# Patient Record
Sex: Female | Born: 1945 | Race: White | Hispanic: No | Smoking: Never smoker
Health system: Southern US, Community
[De-identification: ages and names within clinical notes are randomized; demographics above are authoritative.]

## PROBLEM LIST (undated history)

## (undated) DIAGNOSIS — N183 Chronic kidney disease, stage 3 (moderate): Secondary | ICD-10-CM

## (undated) DIAGNOSIS — Z8619 Personal history of other infectious and parasitic diseases: Secondary | ICD-10-CM

## (undated) DIAGNOSIS — G43109 Migraine with aura, not intractable, without status migrainosus: Secondary | ICD-10-CM

## (undated) DIAGNOSIS — F329 Major depressive disorder, single episode, unspecified: Secondary | ICD-10-CM

## (undated) DIAGNOSIS — M8580 Other specified disorders of bone density and structure, unspecified site: Secondary | ICD-10-CM

## (undated) DIAGNOSIS — F419 Anxiety disorder, unspecified: Secondary | ICD-10-CM

## (undated) DIAGNOSIS — E039 Hypothyroidism, unspecified: Secondary | ICD-10-CM

## (undated) DIAGNOSIS — F32A Depression, unspecified: Secondary | ICD-10-CM

## (undated) DIAGNOSIS — T1490XA Injury, unspecified, initial encounter: Secondary | ICD-10-CM

## (undated) DIAGNOSIS — Z8669 Personal history of other diseases of the nervous system and sense organs: Secondary | ICD-10-CM

## (undated) DIAGNOSIS — R600 Localized edema: Secondary | ICD-10-CM

## (undated) DIAGNOSIS — C4490 Unspecified malignant neoplasm of skin, unspecified: Secondary | ICD-10-CM

## (undated) HISTORY — DX: Unspecified malignant neoplasm of skin, unspecified: C44.90

## (undated) HISTORY — PX: ABDOMINAL HYSTERECTOMY: SHX81

## (undated) HISTORY — DX: Localized edema: R60.0

## (undated) HISTORY — DX: Personal history of other infectious and parasitic diseases: Z86.19

## (undated) HISTORY — DX: Migraine with aura, not intractable, without status migrainosus: G43.109

## (undated) HISTORY — PX: TONSILLECTOMY: SUR1361

## (undated) HISTORY — DX: Depression, unspecified: F32.A

## (undated) HISTORY — PX: CATARACT EXTRACTION: SUR2

## (undated) HISTORY — DX: Injury, unspecified, initial encounter: T14.90XA

## (undated) HISTORY — DX: Chronic kidney disease, stage 3 (moderate): N18.3

## (undated) HISTORY — DX: Personal history of other diseases of the nervous system and sense organs: Z86.69

## (undated) HISTORY — DX: Major depressive disorder, single episode, unspecified: F32.9

## (undated) HISTORY — DX: Other specified disorders of bone density and structure, unspecified site: M85.80

---

## 1968-03-31 HISTORY — PX: BREAST EXCISIONAL BIOPSY: SUR124

## 1998-12-28 ENCOUNTER — Other Ambulatory Visit: Admission: RE | Admit: 1998-12-28 | Discharge: 1998-12-28 | Payer: Self-pay | Admitting: Family Medicine

## 1999-08-05 ENCOUNTER — Observation Stay (HOSPITAL_COMMUNITY): Admission: RE | Admit: 1999-08-05 | Discharge: 1999-08-06 | Payer: Self-pay | Admitting: Urology

## 1999-08-05 ENCOUNTER — Encounter: Payer: Self-pay | Admitting: Urology

## 1999-09-25 ENCOUNTER — Encounter: Admission: RE | Admit: 1999-09-25 | Discharge: 1999-09-25 | Payer: Self-pay | Admitting: Family Medicine

## 1999-09-25 ENCOUNTER — Encounter: Payer: Self-pay | Admitting: Family Medicine

## 2000-01-20 ENCOUNTER — Other Ambulatory Visit: Admission: RE | Admit: 2000-01-20 | Discharge: 2000-01-20 | Payer: Self-pay | Admitting: Family Medicine

## 2000-03-31 HISTORY — PX: TOTAL VAGINAL HYSTERECTOMY: SHX2548

## 2000-09-25 ENCOUNTER — Encounter: Payer: Self-pay | Admitting: Family Medicine

## 2000-09-25 ENCOUNTER — Encounter: Admission: RE | Admit: 2000-09-25 | Discharge: 2000-09-25 | Payer: Self-pay | Admitting: Family Medicine

## 2001-03-02 ENCOUNTER — Other Ambulatory Visit: Admission: RE | Admit: 2001-03-02 | Discharge: 2001-03-02 | Payer: Self-pay | Admitting: Family Medicine

## 2001-12-23 ENCOUNTER — Encounter: Admission: RE | Admit: 2001-12-23 | Discharge: 2001-12-23 | Payer: Self-pay | Admitting: Family Medicine

## 2001-12-23 ENCOUNTER — Encounter: Payer: Self-pay | Admitting: Family Medicine

## 2002-07-13 ENCOUNTER — Other Ambulatory Visit: Admission: RE | Admit: 2002-07-13 | Discharge: 2002-07-13 | Payer: Self-pay | Admitting: Family Medicine

## 2003-01-19 ENCOUNTER — Encounter: Payer: Self-pay | Admitting: Family Medicine

## 2003-01-19 ENCOUNTER — Encounter: Admission: RE | Admit: 2003-01-19 | Discharge: 2003-01-19 | Payer: Self-pay | Admitting: Family Medicine

## 2003-10-04 ENCOUNTER — Other Ambulatory Visit: Admission: RE | Admit: 2003-10-04 | Discharge: 2003-10-04 | Payer: Self-pay | Admitting: Obstetrics and Gynecology

## 2003-10-19 ENCOUNTER — Encounter (INDEPENDENT_AMBULATORY_CARE_PROVIDER_SITE_OTHER): Payer: Self-pay | Admitting: *Deleted

## 2003-10-20 ENCOUNTER — Inpatient Hospital Stay (HOSPITAL_COMMUNITY): Admission: RE | Admit: 2003-10-20 | Discharge: 2003-10-21 | Payer: Self-pay | Admitting: Obstetrics and Gynecology

## 2004-04-25 ENCOUNTER — Encounter: Admission: RE | Admit: 2004-04-25 | Discharge: 2004-04-25 | Payer: Self-pay | Admitting: Obstetrics and Gynecology

## 2005-01-01 ENCOUNTER — Ambulatory Visit (HOSPITAL_COMMUNITY): Admission: RE | Admit: 2005-01-01 | Discharge: 2005-01-01 | Payer: Self-pay | Admitting: Gastroenterology

## 2005-01-01 HISTORY — PX: COLONOSCOPY: SHX174

## 2005-01-01 LAB — HM COLONOSCOPY

## 2005-02-11 ENCOUNTER — Other Ambulatory Visit: Admission: RE | Admit: 2005-02-11 | Discharge: 2005-02-11 | Payer: Self-pay | Admitting: Obstetrics and Gynecology

## 2005-08-05 ENCOUNTER — Encounter: Admission: RE | Admit: 2005-08-05 | Discharge: 2005-08-05 | Payer: Self-pay | Admitting: Obstetrics and Gynecology

## 2006-09-28 ENCOUNTER — Encounter: Admission: RE | Admit: 2006-09-28 | Discharge: 2006-09-28 | Payer: Self-pay | Admitting: Family Medicine

## 2007-10-11 ENCOUNTER — Encounter: Admission: RE | Admit: 2007-10-11 | Discharge: 2007-10-11 | Payer: Self-pay | Admitting: Family Medicine

## 2007-10-20 ENCOUNTER — Encounter: Admission: RE | Admit: 2007-10-20 | Discharge: 2007-10-20 | Payer: Self-pay | Admitting: Family Medicine

## 2008-11-03 ENCOUNTER — Encounter: Admission: RE | Admit: 2008-11-03 | Discharge: 2008-11-03 | Payer: Self-pay | Admitting: Family Medicine

## 2009-10-08 LAB — LIPID PANEL
Cholesterol, Total: 207
Direct LDL: 123
HDL: 62 mg/dL (ref 35–70)
Triglycerides: 111

## 2009-11-14 ENCOUNTER — Encounter: Admission: RE | Admit: 2009-11-14 | Discharge: 2009-11-14 | Payer: Self-pay | Admitting: Family Medicine

## 2010-08-16 NOTE — Op Note (Signed)
NAME:  Karen [name], Karen [name]                          ACCOUNT NO.:  [id number]   MEDICAL RECORD NO.:  [id number]                   PATIENT TYPE:  INP   LOCATION:  9306                                 FACILITY:  [facility]   PHYSICIAN:  Michelle L. Vincente Poli, M.D.            DATE OF BIRTH:  1945-07-06   DATE OF PROCEDURE:  10/19/2003  DATE OF DISCHARGE:  10/21/2003                                 OPERATIVE REPORT   PREOPERATIVE DIAGNOSIS:  Uterine prolapse.   POSTOPERATIVE DIAGNOSIS:  Uterine prolapse.   OPERATION PERFORMED:  Laparoscopic-assisted vaginal hysterectomy and  bilateral salpingo-oophorectomy.   SURGEON:  Michelle L. Vincente Poli, M.D.   ASSISTANT:  Raynald Kemp, M.D.   ANESTHESIA:  General.   ESTIMATED BLOOD LOSS:  Less than 100 mL.   DRAINS:  Foley catheter.   COMPLICATIONS:  None.   SPECIMENS:  Uterus, cervix, tubes and ovaries.   DESCRIPTION OF PROCEDURE:  The patient was taken to the operating room and  intubated without difficulty.  She was then prepped and draped in standard  fashion and [name] Foley catheter was inserted into the bladder.  Attention was  turned to the abdomen.  [name] small infraumbilical incision was made.  The  Veress needle was inserted without difficulty.  Pneumoperitoneum was  achieved.  The Veress needle was removed and [name] 10 to 11 mm trocar was  inserted without difficulty.  The patient was then placed in mild  Trendelenburg position and [name] small 5 mm incision was made suprapubically and  [name] 5 mm trocar was inserted under direct visualization.  We then inspected  the uterus and ovaries and all pelvic structures appeared to be within  normal limits.  There were no pelvic adhesions noted.  We turned our  attention to the right side of the pelvis initially by grasping the right  ovary with [name] grasper, identifying the IP ligament and the ureter which were  well below the IP ligament, placing the Gyrus instrument across the IP  ligament and burning and transecting it  with hemostasis.  We then followed  our way along the mesosalpinx in similar fashion using the Gyrus and  dividing it with hemostasis.  We then turned our attention to the left side  of the pelvis where in [name] similar fashion, the left ovary was grasped with [name]  grasper.  The IP ligament was identified and the ureter was found to be well  below our clamp and the Gyrus instrument was used to burn and to divide and  transect the IP ligament on the left.  We then followed the mesosalpinx  along and divided it using the Gyrus instrument with good hemostasis. When  hemostasis was noted, we then turned our attention vaginally.  The patient's  legs were placed in high lithotomy position for better visualization  vaginally.  [name] tenaculum was used to grasp the cervix which was at the level  of the  introitus and [name] circumferential incision was made around the cervix.  We then entered the posterior cul-de-sac sharply and then with careful  dissection entered the anterior cul-de-sac sharply as well.  We then used  curved Heaney clamps to work our way up along the uterus by clamping just  beside the cervix.  Each pedicle was cut and suture ligated using 0 Vicryl  suture.  Excellent hemostasis was noted.  Then we worked our way up the  broad ligament staying just beside the uterus and each clamp used [name] curved  Heaney clamp.  The pedicles were cut and suture ligated using 0 Vicryl  suture.  Once we reached the level of the triple pedicles, we retroflexed  the uterus and removed the uterus in its entirety along with the tubes and  the ovaries.  All pedicles were inspected.  Hemostasis was again noted  everywhere.  We then closed the posterior cuff with continuous running  locked sutures and 0 Vicryl suture and then closed the entire vaginal cuff  from anterior posterior in [name] continuous running locked stitch using 2-0  Vicryl suture.  At this point we then turned our attention back to the  abdomen where the  pneumoperitoneum was then performed again and irrigation  was performed of the pelvis using the Nezhat.  All pedicles were inspected  and hemostasis was noted.  The pneumoperitoneum was released and the 5 mm  trocar was removed without difficulty under direct visualization.  No  bleeding was noted from the port sites intra-abdominally.  We then removed  the 11 mm trocar and closed each incision site with interrupteds using 3-0  Vicryl suture and infiltrated local into each incision site.  All sponge,  lap and instrument counts were correct times two.  The patient tolerated the  procedure well, was extubated and went to recovery room in stable condition.                                               Michelle L. Vincente Poli, M.D.    Florestine Avers  D:  10/23/2003  T:  10/24/2003  Job:  [id number]

## 2010-08-16 NOTE — Op Note (Signed)
[facility]  Patient:    Karen [name], Karen [name]                       MRN: [id number] Proc. Date: 08/05/99 Adm. Date:  [id number] Attending:  Laqueta Jean CC:         [facility]             Elease Hashimoto A. Benedetto Goad, M.D. [facility]                           Operative Report  PREOPERATIVE DIAGNOSIS:  Urinary incontinence with cystourethrocele and rectocele.  POSTOPERATIVE DIAGNOSIS:  Urinary incontinence with cystourethrocele and rectocele.  PROCEDURE:  Anterior vaginal vault repair and posterior vaginal vault repair, and pubovaginal sling.  SURGEON:  Sigmund I. Patsi Sears, M.D.  ANESTHESIA:  General endotracheal anesthesia.  PREPARATION:  After appropriate preanesthesia, the patient was brought to the operating room and placed on the operating table in the dorsal supine position where general endotracheal anesthesia was introduced.  She was then replaced in the dorsal lithotomy position and placed in low Allen stirrups and the pubis was prepped with Betadine solution and draped in the usual fashion.  INDICATIONS:  This 65 year old female is para 2-2-0, with a large cystourethrocele, which she can palpate, which is noted to be grade III to IV on physical examination, with urinary incontinence and a large rectocele as well.  DESCRIPTION OF PROCEDURE:  20 cc of Marcaine 0.5% with epinephrine 1:200,000 was injected into the pubocervical fascia large tissue, and a semilunar incision was made from the 3 oclock to the 12 oclock to 9 oclock position.  Subcutaneous tissue was dissected with the electrosurgical unit and a tongue of vaginal epithelial tissue was dissected.  The dissection was carried into the retropubic space bilaterally.  Using the pelvic anchor system, two anchors with sutures were placed bilaterally into the posterior portion of the pubis.  A 6 x 8 cm portion of tutoplast was then outlined in a T-fashion so that the sling measured  8 cm by 3 cm, and the T portion measured 2 cm x 6 cm.  The #1 Prolene sutures were then placed in retrograde fashion into the T and the tissue was sutured to the pubic wall.  A 2-0 PDS suture was then placed through the cardinal ligaments bilaterally to afford mid vaginal vault repair, and the remaining portion of the T-portion of the sling was sutured to the lateral fascia, following anterior repair with horizontal mattress 3-0 Vicryl popoffs.  The devascularized tip of the vaginal tissue was excised and the vaginal epithelium was closed with running 3-0 Vicryl sutures.  Attention was then directed to the rectocele, which was noted to be quite large. Two T-clamps were then placed in the lateral portion of the rectal vault, and 10 cc of Marcaine 0.25% plain was injected into the perirectal fascial arch tissue. Dissection was accomplished.  Midline dissection was accomplished.  There was no enterocele noted.  A portion of the remaining fascia was then used to place across the rectum and sutured to the lateral vaginal fascia.  The posterior vaginal epithelial tissue was then excised, and the tissue was closed with running buried 3-0 Vicryl popoff suture.  Excellent rectocele repair was accomplished, and the  vagina was packed in the standard fashion.  Following this, cystoscopy revealed  previously given blue contrast from both ureters, which were both identified and photodocumented.  Foley catheter  was placed.  The patient was given IV Toradol, awakened, and taken to the recovery room in good condition. DD:  08/05/99 TD:  08/06/99 Job: [id number]

## 2010-08-16 NOTE — Op Note (Signed)
NAMESALEEMA, [name]                ACCOUNT NO.:  [id number]   MEDICAL RECORD NO.:  [id number]          PATIENT TYPE:  AMB   LOCATION:  ENDO                         FACILITY:  [facility]   PHYSICIAN:  Anselmo Rod, M.D.  DATE OF BIRTH:  09/22/1945   DATE OF PROCEDURE:  01/01/2005  DATE OF DISCHARGE:                                 OPERATIVE REPORT   PROCEDURE:  Screening colonoscopy.   ENDOSCOPIST:  Anselmo Rod, M.D.   INSTRUMENT USED:  Olympus video colonoscope.   INDICATIONS FOR PROCEDURE:  A 65 year old white female with a history of  chronic constipation and intermittent rectal bleeding undergoing a screening  colonoscopy to rule out colonic polyps, masses, etc.   PREPROCEDURE PREPARATION:  Informed consent was procured from the patient.  The patient was fasted for 8 hours prior to the procedure and prepped with a  bottle of magnesium citrate and a gallon of GoLYTELY the night prior to the  procedure.  Risks and benefits of the procedure including a 10% miss rate of  cancer and polyp was discussed with her as well.   PREPROCEDURE PHYSICAL EXAMINATION:  VITAL SIGNS:  Stable.  NECK:  Supple.  CHEST:  Clear to auscultation.  S1 and S2 regular.  ABDOMEN:  Soft with normal bowel sounds.   DESCRIPTION OF PROCEDURE:  The patient was placed in the left lateral  decubitus position and sedated with 70 mg of Demerol and 8 mg of Versed in  slow incremental doses.  Once the patient was adequately sedated and  maintained on low flow oxygen and continuous cardiac monitoring, the Olympus  video colonoscope was advanced from the rectum to the cecum.  The  appendiceal orifice and ileocecal valve were visualized and photographed.  The patient had severe melanosis coli especially on the right side of the  colon including the transverse colon.  The changes were less prominent in  the rectum and sigmoid colon.  No masses or polyps were seen.  There was no  evidence of diverticulosis and large  internal hemorrhoid was appreciated on  retroflexion in the rectum.  The terminal ileum appeared normal and without  lesions.  The patient tolerated the procedure well without complications.  There was some residual stool in the colon.  Small lesions could have been  missed.   IMPRESSION:  1.  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  2.  Large internal hemorrhoid noted.  3.  Normal terminal ileum.  4.  Some residual stool in the colon.  Small lesions could be missed.   RECOMMENDATIONS:  1.Discontinue all laxatives including Senna and cascara  extracts.  1.  Repeat colonoscopy in the next 10 years unless the patient develops any      abnormal symptoms in the interim.  High fiber diet with liberal fluid      intake.  3.The patient will be referred for surgery for hemorrhoidal bleeding  persistence inspite of soft stool.4.Anusol HC  suppositoroes 25 gms one PR QHS has been advised and a prescription has  been  called into her pharmacy at [facility].  5.Outpatient follow-up as need arises in the future.      Anselmo Rod, M.D.  Electronically Signed     JNM/MEDQ  D:  01/01/2005  T:  01/01/2005  Job:  [id number]   cc:   Marcelino Duster L. Vincente Poli, M.D.  Fax: [phone]

## 2010-10-10 LAB — COMPREHENSIVE METABOLIC PANEL
ALT: 17 U/L (ref 7–35)
AST: 21 U/L
Alkaline Phosphatase: 94 U/L
Creat: 1.02
Total Bilirubin: 0.6 mg/dL

## 2010-10-10 LAB — CBC: Hemoglobin: 14.6 g/dL (ref 12.0–16.0)

## 2010-12-11 ENCOUNTER — Other Ambulatory Visit: Payer: Self-pay | Admitting: Family Medicine

## 2010-12-11 DIAGNOSIS — Z1231 Encounter for screening mammogram for malignant neoplasm of breast: Secondary | ICD-10-CM

## 2010-12-24 ENCOUNTER — Ambulatory Visit
Admission: RE | Admit: 2010-12-24 | Discharge: 2010-12-24 | Disposition: A | Discharge status: Home / Self Care | Payer: Medicare Other | Source: Ambulatory Visit | Attending: Family Medicine | Admitting: Family Medicine

## 2010-12-24 DIAGNOSIS — Z1231 Encounter for screening mammogram for malignant neoplasm of breast: Secondary | ICD-10-CM

## 2011-04-01 DIAGNOSIS — R600 Localized edema: Secondary | ICD-10-CM

## 2011-04-01 HISTORY — DX: Localized edema: R60.0

## 2011-08-19 ENCOUNTER — Other Ambulatory Visit: Payer: Self-pay | Admitting: Dermatology

## 2012-01-03 DIAGNOSIS — T1490XA Injury, unspecified, initial encounter: Secondary | ICD-10-CM

## 2012-01-03 HISTORY — PX: SKIN GRAFT: SHX250

## 2012-01-03 HISTORY — DX: Injury, unspecified, initial encounter: T14.90XA

## 2012-01-15 ENCOUNTER — Ambulatory Visit: Payer: Medicare Other | Admitting: Family Medicine

## 2012-01-16 ENCOUNTER — Encounter: Payer: Self-pay | Admitting: Family Medicine

## 2012-01-16 ENCOUNTER — Ambulatory Visit (INDEPENDENT_AMBULATORY_CARE_PROVIDER_SITE_OTHER): Payer: Medicare Other | Admitting: Family Medicine

## 2012-01-16 ENCOUNTER — Other Ambulatory Visit: Payer: Self-pay | Admitting: Cardiology

## 2012-01-16 ENCOUNTER — Encounter (INDEPENDENT_AMBULATORY_CARE_PROVIDER_SITE_OTHER): Payer: Medicare Other

## 2012-01-16 VITALS — BP 144/84 | HR 88 | Temp 98.3°F | Ht 63.5 in | Wt 148.2 lb

## 2012-01-16 DIAGNOSIS — S81809A Unspecified open wound, unspecified lower leg, initial encounter: Secondary | ICD-10-CM

## 2012-01-16 DIAGNOSIS — M7989 Other specified soft tissue disorders: Secondary | ICD-10-CM

## 2012-01-16 DIAGNOSIS — Z9889 Other specified postprocedural states: Secondary | ICD-10-CM

## 2012-01-16 DIAGNOSIS — Z945 Skin transplant status: Secondary | ICD-10-CM

## 2012-01-16 MED ORDER — CEPHALEXIN 500 MG PO CAPS: 500.0000 mg | ORAL_CAPSULE | Freq: Three times a day (TID) | ORAL | Status: DC
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
## 2012-01-16 NOTE — Patient Instructions (Addendum)
Pass by Karen [name]'s office to schedule ultrasound for today and wound care consult next week. Start keflex 500mg  three times daily for 7 days for possible infection. Continue dressing changes as up to now while we get you in to see wound care doctors. Do dressing changes on feet daily. We will request records from [facility] and from prior PCP. Return in 1 month for follow up. Good to meet you today, call us with questions.
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
## 2012-01-19 ENCOUNTER — Telehealth: Payer: Self-pay

## 2012-01-19 NOTE — Telephone Encounter (Signed)
Will see tomorrow.  Doppler negative for DVT.

## 2012-01-19 NOTE — Telephone Encounter (Signed)
Pt lt leg,foot and ankle is more swollen than when seen 01/16/12. No pain that is not controlled by tylenol and benadryl. No chest pain or SOB. Pt could not get appt with wound center until 01/29/12.  Dr Reece Agar said come in for reck. Pt said 01/20/12 at 8:15 is OK; advised pt if condition changed or worsened to go to UC or ER tonight. Pt voiced understanding and will keep leg elevated this evening. Pt said her daughter stays with her at night.

## 2012-01-20 ENCOUNTER — Ambulatory Visit (INDEPENDENT_AMBULATORY_CARE_PROVIDER_SITE_OTHER): Payer: Medicare Other | Admitting: Family Medicine

## 2012-01-20 ENCOUNTER — Encounter: Payer: Self-pay | Admitting: Family Medicine

## 2012-01-20 VITALS — BP 130/80 | HR 104 | Temp 98.9°F | Wt 150.5 lb

## 2012-01-20 DIAGNOSIS — M7989 Other specified soft tissue disorders: Secondary | ICD-10-CM

## 2012-01-20 DIAGNOSIS — S81809A Unspecified open wound, unspecified lower leg, initial encounter: Secondary | ICD-10-CM

## 2012-01-20 MED ORDER — HYDROCHLOROTHIAZIDE 12.5 MG PO CAPS: 12.5000 mg | ORAL_CAPSULE | Freq: Every day | ORAL | Status: DC | PRN

## 2012-01-20 NOTE — Assessment & Plan Note (Signed)
Remaining.  Not much better but not significantly worse either. reasurred given normal dopplers.   Did have large fluid collection in left thigh.

## 2012-01-20 NOTE — Patient Instructions (Addendum)
Try melatonin at night to help sleeping, or benadryl. Ok to use tylenol as needed. May use hydrochlorothiazide for swelling as needed. Ensure getting plenty of water.  Avoid salt.  Elevate left leg.  All of these things will help with swelling. Wounds are actually looking better today.  If doing great, we may cancel wound clinic appointment.  Give wounds 1 more week. Come back to see me in 1 month.

## 2012-01-20 NOTE — Assessment & Plan Note (Signed)
Wounds looking better - if continues improving, recommend cancelling wound clinic eval. Black eschar now looking more like scabbing than prior concern for necrosis
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
## 2012-01-26 ENCOUNTER — Telehealth: Payer: Self-pay | Admitting: Family Medicine

## 2012-01-26 NOTE — Telephone Encounter (Signed)
Patient called to ask you to cancel the Wound Ctr appt that  Was made by Gavin Pound. She said the wounds were healing good and that she didn't need to go there. Apparently there was some confusion over getting the information about the appt. The patient could not tell me how she got the appt info whether it was over the phone or here in person. She was upset with the process and asked how you were supposed to know if she should go or not and I assumed that you told her to call here to let you know how she was doing. I called and cancelled the appt that was made for 01/29/12 at [facility] wound center. I am not sure how Gavin Pound gave her this info maybe she left it on VM? Again Ms Balik could not tell me how she was given the Wound Ctr Appt at all.

## 2012-01-26 NOTE — Telephone Encounter (Signed)
Yes I told her to call us if wounds doing well to cancel wound center appt.  It's in my last patient instructions. Selena Batten, can you call Ms Simington to see if she has any more questions?  Thanks.

## 2012-01-27 NOTE — Telephone Encounter (Signed)
Called pt at home and cell phone.  Left message on cell apologizing for miscommunication yesterday, and confirming that we have cancelled wound care clinic consult this week.  Asked her to call us with any questions.

## 2012-01-27 NOTE — Telephone Encounter (Signed)
Spoke with patient. She was upset because no one from here called her to check on her to see if she needed to keep the wound clinic appt. I advised that her instructions given to her at her last OV that I reviewed with her instructed her that it was up to her to determine if she felt the need to keep that appt or not. She said she still felt that it was our responsibility as a medical practice to show more compassion after all she has been through to call and check on to see if she still needed the appointment. She said she didn't even know where to call to cancel it herself. I asked her if she was given the appt information and she said she was, but she still didn't know if she should've called here or there to cancel and said if we had called her to check on her, then she could've told us she didn't need the appt. She said she was very upset with the lack of compassion for her circumstances here because after all she has been through, she can't keep up with everything. She said she probably wouldn't be back as a patient. I told her I was sorry that she was upset, but that it seemed to be a misunderstanding on everyone's part because she was expecting something from [place], that is not our typical practice. She said she understood misunderstandings and that Dr. Reece Agar was very nice to her, but she would probably still be finding somewhere else to go.

## 2012-02-09 ENCOUNTER — Encounter: Payer: Self-pay | Admitting: Family Medicine

## 2012-02-16 ENCOUNTER — Ambulatory Visit: Payer: Medicare Other | Admitting: Family Medicine

## 2012-02-27 ENCOUNTER — Telehealth: Payer: Self-pay | Admitting: *Deleted

## 2012-02-27 NOTE — Telephone Encounter (Signed)
Just FYI, pt calling complaing about a no-show charge she got for 01/30/2012 and per pt she cancelled thru the appt reminder that called her. Pt did leave message at the billing office but just wanted a note in her chart, because she will not pay that bill.

## 2012-02-28 NOTE — Telephone Encounter (Signed)
Noted.  Will route to Adrienne to ensure pt isn't charged as she states cancelled.

## 2012-03-11 ENCOUNTER — Encounter: Payer: Self-pay | Admitting: Family Medicine

## 2012-04-05 ENCOUNTER — Encounter: Payer: Self-pay | Admitting: Family Medicine

## 2012-04-05 ENCOUNTER — Ambulatory Visit (INDEPENDENT_AMBULATORY_CARE_PROVIDER_SITE_OTHER): Payer: Medicare Other | Admitting: Family Medicine

## 2012-04-05 VITALS — BP 132/88 | HR 88 | Temp 98.0°F | Wt 152.0 lb

## 2012-04-05 DIAGNOSIS — F4323 Adjustment disorder with mixed anxiety and depressed mood: Secondary | ICD-10-CM | POA: Insufficient documentation

## 2012-04-05 DIAGNOSIS — F329 Major depressive disorder, single episode, unspecified: Secondary | ICD-10-CM

## 2012-04-05 DIAGNOSIS — F3289 Other specified depressive episodes: Secondary | ICD-10-CM

## 2012-04-05 DIAGNOSIS — F32A Depression, unspecified: Secondary | ICD-10-CM

## 2012-04-05 MED ORDER — CITALOPRAM HYDROBROMIDE 10 MG PO TABS: 10.0000 mg | ORAL_TABLET | Freq: Every day | ORAL | Status: DC

## 2012-04-05 MED ORDER — ALPRAZOLAM 0.25 MG PO TABS: 0.2500 mg | ORAL_TABLET | Freq: Two times a day (BID) | ORAL | Status: DC | PRN
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
## 2012-04-05 NOTE — Patient Instructions (Addendum)
Start citalopram (celexa) 10mg  daily - depression and anxiety medication.  This will be long term. Start alprazolam (xanax) anxiety medication to take if needed - 1/2 to 1 pill up to two times a day.  This will be temporary medicine. Return to see me in 1 month.

## 2012-04-05 NOTE — Assessment & Plan Note (Signed)
Mood disorder anticipate both depression and anxiety components, along with mourning. Discussed natural grieving process. Pt requests medication to help cope with current process as well as underlying mood disorder. Discussed different meds: SSRI and benzos - including side effects.  Will start celexa at 10mg  daily and xanax temporarily for next 1-2 months - discussed tolerance/dependance side effect of long term benzo use. rtc 1 mo for f/u, sooner if needed. A total of 25 minutes were spent face-to-face with the patient during this encounter and over half of that time was spent on counseling and coordination of care

## 2012-05-06 ENCOUNTER — Ambulatory Visit (INDEPENDENT_AMBULATORY_CARE_PROVIDER_SITE_OTHER): Payer: Medicare PPO | Admitting: Family Medicine

## 2012-05-06 ENCOUNTER — Encounter: Payer: Self-pay | Admitting: Family Medicine

## 2012-05-06 VITALS — BP 126/76 | HR 80 | Temp 98.2°F | Wt 149.2 lb

## 2012-05-06 DIAGNOSIS — F329 Major depressive disorder, single episode, unspecified: Secondary | ICD-10-CM

## 2012-05-06 DIAGNOSIS — F3289 Other specified depressive episodes: Secondary | ICD-10-CM

## 2012-05-06 DIAGNOSIS — F32A Depression, unspecified: Secondary | ICD-10-CM

## 2012-05-06 MED ORDER — CITALOPRAM HYDROBROMIDE 20 MG PO TABS: 20.0000 mg | ORAL_TABLET | Freq: Every day | ORAL | Status: DC
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
## 2012-05-06 NOTE — Patient Instructions (Addendum)
Lets increase celexa (citalopram) to 20mg  daily.  I'm glad you're doing well on this medicine.   May continue xanax (alprazolam) 0.25mg  1/2 to 1 tablet twice daily but use only as needed. For sleep , may use xanax but our goal will be to come off this medicine.  May try benadryl instead. Give me a call with any questions or concerns on new dose. Return to see me in 1 month, sooner if needed.

## 2012-05-06 NOTE — Assessment & Plan Note (Addendum)
Longstanding mood disorder, even prior to husband's passing. Grieving contributing but anticipate underlying depression and possible ADHD. Tolerant and responsive to celexa so will increase to 20mg  daily. Continue xanax prn for now, discussed eventual wean. rtc 1 mo for f/u.  PHQ9 = 13/27 GAD7 = 7/21 MDQ = negative.  A total of 30 minutes were spent face-to-face with the patient during this encounter and over half of that time was spent on counseling and coordination of care

## 2012-05-07 ENCOUNTER — Telehealth: Payer: Self-pay

## 2012-05-07 NOTE — Telephone Encounter (Signed)
Spoke with pt and discussed concerns.  Ok to continue 20mg  dose.

## 2012-05-07 NOTE — Telephone Encounter (Signed)
Pt seen 05/06/12 and Citalopram increased to 20 mg. When pt picked up med precaution sheet advised to check with physician; pt has family history of double first cousin committed suicide, 2nd cousin committed suicide and pts father had cirrhosis of liver. Pt wants to know if still OK for pt to take Citalopram 20 mg or should pt cut in half and take 10 mg daily. Please advise. [facility]

## 2012-05-18 ENCOUNTER — Telehealth: Payer: Self-pay

## 2012-05-18 NOTE — Telephone Encounter (Signed)
Spoke with pt - recommended cut celexa in 1/2 - to total of 10mg  daily as she knows she did well on this dose. Has appt with me in 2 wks. Pt agrees with plan.

## 2012-05-18 NOTE — Addendum Note (Signed)
Addended by: Eustaquio Boyden on: 05/18/2012 01:48 PM   Modules accepted: Orders

## 2012-05-18 NOTE — Telephone Encounter (Signed)
Pt said does not feel good inside and feels jittery. Pt said she does not normally take medicine. Pt presently taking Citalopram 20 mg once daily and alprazolam 0.25 mg taking twice daily. Pt said can tell no difference in how she feels since Citalopram increased from 10 mg to 20 mg. Pt does not feel like she will harm herself or anyone else. Pt crying while on phone. Pt does not want an appt; she just wants Dr Timoteo Expose opinion if she should continue her med or change med.Please advise. [facility].

## 2012-06-01 ENCOUNTER — Telehealth: Payer: Self-pay | Admitting: Family Medicine

## 2012-06-01 ENCOUNTER — Ambulatory Visit: Payer: Medicare PPO | Admitting: Family Medicine

## 2012-06-01 NOTE — Telephone Encounter (Signed)
Noted. Thanks.

## 2012-06-01 NOTE — Telephone Encounter (Signed)
Karen [name] called to reschedule her appointment to 06/11/12, and just wanted you to know that she is sleeping everyday and doing very well.  She was talkative and excited about her upcoming family trip to [place] with all her children and grandchildren. Callback not needed, just an FYI.  Best number for Karen [name] is [phone] H                                            [phone] cell

## 2012-06-03 ENCOUNTER — Ambulatory Visit: Payer: Medicare PPO | Admitting: Family Medicine

## 2012-06-04 ENCOUNTER — Ambulatory Visit: Payer: Medicare PPO | Admitting: Family Medicine

## 2012-06-08 ENCOUNTER — Encounter: Payer: Self-pay | Admitting: Family Medicine

## 2012-06-08 ENCOUNTER — Ambulatory Visit (INDEPENDENT_AMBULATORY_CARE_PROVIDER_SITE_OTHER): Payer: Medicare PPO | Admitting: Family Medicine

## 2012-06-08 VITALS — BP 118/78 | HR 88 | Temp 98.7°F | Wt 148.8 lb

## 2012-06-08 DIAGNOSIS — F39 Unspecified mood [affective] disorder: Secondary | ICD-10-CM

## 2012-06-08 MED ORDER — ALPRAZOLAM 0.25 MG PO TABS: 0.2500 mg | ORAL_TABLET | Freq: Two times a day (BID) | ORAL | Status: DC | PRN

## 2012-06-08 NOTE — Patient Instructions (Addendum)
Look at sleep hygiene suggestions below - if not helping we may discuss other medicines to help staying asleep.   Insomnia Insomnia is frequent trouble falling and/or staying asleep. Insomnia can be a long term problem or a short term problem. Both are common. Insomnia can be a short term problem when the wakefulness is related to a certain stress or worry. Long term insomnia is often related to ongoing stress during waking hours and/or poor sleeping habits. Overtime, sleep deprivation itself can make the problem worse. Every little thing feels more severe because you are overtired and your ability to cope is decreased. CAUSES   Stress, anxiety, and depression.  Poor sleeping habits.  Distractions such as TV in the bedroom.  Naps close to bedtime.  Engaging in emotionally charged conversations before bed.  Technical reading before sleep.  Alcohol and other sedatives. They may make the problem worse. They can hurt normal sleep patterns and normal dream activity.  Stimulants such as caffeine for several hours prior to bedtime.  Pain syndromes and shortness of breath can cause insomnia.  Exercise late at night.  Changing time zones may cause sleeping problems (jet lag). It is sometimes helpful to have someone observe your sleeping patterns. They should look for periods of not breathing during the night (sleep apnea). They should also look to see how long those periods last. If you live alone or observers are uncertain, you can also be observed at a sleep clinic where your sleep patterns will be professionally monitored. Sleep apnea requires a checkup and treatment. Give your caregivers your medical history. Give your caregivers observations your family has made about your sleep.  SYMPTOMS   Not feeling rested in the morning.  Anxiety and restlessness at bedtime.  Difficulty falling and staying asleep. TREATMENT   Your caregiver may prescribe treatment for an underlying medical  disorders. Your caregiver can give advice or help if you are using alcohol or other drugs for self-medication. Treatment of underlying problems will usually eliminate insomnia problems.  Medications can be prescribed for short time use. They are generally not recommended for lengthy use.  Over-the-counter sleep medicines are not recommended for lengthy use. They can be habit forming.  You can promote easier sleeping by making lifestyle changes such as:  Using relaxation techniques that help with breathing and reduce muscle tension.  Exercising earlier in the day.  Changing your diet and the time of your last meal. No night time snacks.  Establish a regular time to go to bed.  Counseling can help with stressful problems and worry.  Soothing music and white noise may be helpful if there are background noises you cannot remove.  Stop tedious detailed work at least one hour before bedtime. HOME CARE INSTRUCTIONS   Keep a diary. Inform your caregiver about your progress. This includes any medication side effects. See your caregiver regularly. Take note of:  Times when you are asleep.  Times when you are awake during the night.  The quality of your sleep.  How you feel the next day. This information will help your caregiver care for you.  Get out of bed if you are still awake after 15 minutes. Read or do some quiet activity. Keep the lights down. Wait until you feel sleepy and go back to bed.  Keep regular sleeping and waking hours. Avoid naps.  Exercise regularly.  Avoid distractions at bedtime. Distractions include watching television or engaging in any intense or detailed activity like attempting to balance the household  checkbook.  Develop a bedtime ritual. Keep a familiar routine of bathing, brushing your teeth, climbing into bed at the same time each night, listening to soothing music. Routines increase the success of falling to sleep faster.  Use relaxation techniques.  This can be using breathing and muscle tension release routines. It can also include visualizing peaceful scenes. You can also help control troubling or intruding thoughts by keeping your mind occupied with boring or repetitive thoughts like the old concept of counting sheep. You can make it more creative like imagining planting one beautiful flower after another in your backyard garden.  During your day, work to eliminate stress. When this is not possible use some of the previous suggestions to help reduce the anxiety that accompanies stressful situations. MAKE SURE YOU:   Understand these instructions.  Will watch your condition.  Will get help right away if you are not doing well or get worse. Document Released: 03/14/2000 Document Revised: 06/09/2011 Document Reviewed: 04/14/2007 [facility] Patient Information 2013 [place], [place].

## 2012-06-08 NOTE — Assessment & Plan Note (Addendum)
Pt does feel overall improvement since starting celexa, but did not tolerate 20mg  dose. Has been using alprazolam intermittently - discussed use and recommended only use PRN. rtc 3 mo for f/u.  Some associated insomnia - sleep maintenance.  Discussed sleep hygiene measures and provided with handout.  If persistent, consider silenor.  Prior visit: PHQ9 = 13 -> 12 GAD7 = 7 -> 8
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
## 2012-06-09 ENCOUNTER — Other Ambulatory Visit: Payer: Self-pay | Admitting: Family Medicine

## 2012-06-11 ENCOUNTER — Ambulatory Visit: Payer: Medicare PPO | Admitting: Family Medicine

## 2012-06-15 ENCOUNTER — Other Ambulatory Visit: Payer: Self-pay

## 2012-06-15 DIAGNOSIS — Z1231 Encounter for screening mammogram for malignant neoplasm of breast: Secondary | ICD-10-CM

## 2012-06-21 ENCOUNTER — Ambulatory Visit: Payer: Medicare PPO | Admitting: Family Medicine

## 2012-06-21 ENCOUNTER — Encounter: Payer: Self-pay | Admitting: Family Medicine

## 2012-06-21 ENCOUNTER — Ambulatory Visit (INDEPENDENT_AMBULATORY_CARE_PROVIDER_SITE_OTHER): Payer: Medicare PPO | Admitting: Family Medicine

## 2012-06-21 VITALS — BP 142/78 | HR 84 | Temp 98.0°F | Wt 150.0 lb

## 2012-06-21 DIAGNOSIS — F39 Unspecified mood [affective] disorder: Secondary | ICD-10-CM

## 2012-06-21 MED ORDER — DOXEPIN HCL 3 MG PO TABS: 3.0000 mg | ORAL_TABLET | Freq: Every evening | ORAL | Status: DC | PRN

## 2012-06-21 MED ORDER — PAROXETINE HCL 20 MG PO TABS: 20.0000 mg | ORAL_TABLET | ORAL | Status: DC

## 2012-06-21 NOTE — Assessment & Plan Note (Signed)
Remains resistant to CBT. Prior endorsed celexa did significantly improve sxs, but currently no benefit noted. Will stop celexa, trial of paxil (given no appetite), start at 20mg  daily. rtc 1 mo, sooner if needed.  For insomnia - sleep maintenance - trial of silenor.  We have discussed sleep hygiene in past.  Benadryl no help.  Excited about upcoming trip with family to [place].

## 2012-06-21 NOTE — Patient Instructions (Signed)
Let's stop citalopram - may throw it away, and start paxil (paroxetine) 20mg  daily starting tomorrow. May continue alprazolam as needed for anxiety. Trial of silenor - this was sent in.  Coupon provided today. Return to see me in 1 month.
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
## 2012-07-05 ENCOUNTER — Other Ambulatory Visit: Payer: Self-pay | Admitting: Family Medicine

## 2012-07-05 MED ORDER — ALPRAZOLAM 0.25 MG PO TABS: ORAL_TABLET | ORAL | Status: DC

## 2012-07-05 NOTE — Telephone Encounter (Signed)
Ok to refill 

## 2012-07-05 NOTE — Telephone Encounter (Signed)
plz phone in. 

## 2012-07-06 NOTE — Telephone Encounter (Signed)
Pt called request status of refill; Medication phoned to [facility] as instructed.pt notified done.

## 2012-07-06 NOTE — Telephone Encounter (Signed)
Rx called as directed. 

## 2012-07-19 ENCOUNTER — Ambulatory Visit (INDEPENDENT_AMBULATORY_CARE_PROVIDER_SITE_OTHER): Payer: Medicare PPO | Admitting: Family Medicine

## 2012-07-19 ENCOUNTER — Encounter: Payer: Self-pay | Admitting: Family Medicine

## 2012-07-19 VITALS — BP 126/74 | HR 82 | Temp 98.0°F | Ht 63.5 in | Wt 153.0 lb

## 2012-07-19 DIAGNOSIS — F39 Unspecified mood [affective] disorder: Secondary | ICD-10-CM

## 2012-07-19 NOTE — Assessment & Plan Note (Signed)
Stable today. Improved on paxil. Continue alprazolam prn. Continue silenor for sleep at night.

## 2012-07-19 NOTE — Patient Instructions (Signed)
You are doing well. No changes today. Only use alprazolam as needed. Continue paxil 20mg  daily.
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
## 2012-08-04 ENCOUNTER — Ambulatory Visit: Payer: Medicare PPO

## 2012-08-10 ENCOUNTER — Other Ambulatory Visit: Payer: Self-pay | Admitting: Family Medicine

## 2012-08-10 NOTE — Telephone Encounter (Signed)
Medication phoned to pharmacy.  

## 2012-08-10 NOTE — Telephone Encounter (Signed)
OK to refill

## 2012-08-10 NOTE — Telephone Encounter (Signed)
plz phone in. 

## 2012-09-03 ENCOUNTER — Ambulatory Visit: Payer: Medicare PPO

## 2012-09-09 ENCOUNTER — Other Ambulatory Visit: Payer: Self-pay | Admitting: Family Medicine

## 2012-09-09 NOTE — Telephone Encounter (Signed)
[facility] 

## 2012-09-09 NOTE — Telephone Encounter (Signed)
plz phone in. 

## 2012-09-10 ENCOUNTER — Ambulatory Visit
Admission: RE | Admit: 2012-09-10 | Discharge: 2012-09-10 | Disposition: A | Discharge status: Home / Self Care | Payer: Medicare PPO | Source: Ambulatory Visit

## 2012-09-10 DIAGNOSIS — Z1231 Encounter for screening mammogram for malignant neoplasm of breast: Secondary | ICD-10-CM

## 2012-09-17 ENCOUNTER — Telehealth: Payer: Self-pay

## 2012-09-17 NOTE — Telephone Encounter (Signed)
Pt left note requesting increase alprazolam from 2 times a day to 3 times a day if needed.Please advise.

## 2012-09-19 NOTE — Telephone Encounter (Signed)
plz notify - ok to increase to TID prn - but I still want her limiting this med - and don't want her taking more than #60 per month

## 2012-09-20 NOTE — Telephone Encounter (Signed)
If needing 60+ alprazolam per month, I want her to come in to discuss. I don't recommend regular use of alprazolam daily - last time I saw her she was only taking one every few days.

## 2012-09-20 NOTE — Telephone Encounter (Signed)
Pt called back; pt already taking alprazolam twice daily and if cannot take more than # 60 / month that will not help pt to tell her OK to take tid prn.Please advise.

## 2012-09-20 NOTE — Telephone Encounter (Signed)
Patient notified as instructed by telephone. 

## 2012-09-21 NOTE — Telephone Encounter (Signed)
Spoke with patient yesterday and advised her of this. She verbalized understanding and said she didn't need 3 pills everyday only some days and was afraid she would run out of meds before 30 days. I advised if she found she was needing them more, then to call for an appt to discuss things. She understood and said she would only take 3 on days she needed 3 and not 3 every day.

## 2012-09-22 ENCOUNTER — Telehealth: Payer: Self-pay

## 2012-09-22 MED ORDER — PAROXETINE HCL 30 MG PO TABS: 30.0000 mg | ORAL_TABLET | ORAL | Status: DC

## 2012-09-22 NOTE — Telephone Encounter (Signed)
Pt said sometimes pt feels better and other times pt feels depressed with decreased appetite.Pt feels pretty good today.Pt said she does not feel like she would harm herself or anyone else.Pt wants to know if Dr Reece Agar thinks Paroxetine 20 mg needs to be increased. Pt does not want to schedule appt. Until ask about increasing paroxetine.[facility]. Pt request call back.

## 2012-09-22 NOTE — Telephone Encounter (Signed)
I agree.  Do recommend increase in paroxetine - but would suggest she increase to 1.5 tablets daily and see how she does on this.  I have sent in 30mg  tablets so she won't have to cut in half when she runs out of current 20mg  tablets. Call us with an update or come in for visit in ~3 weeks.

## 2012-09-22 NOTE — Telephone Encounter (Signed)
Instructions given to pt.  Verbalized understanding and is going to pick up medication today.

## 2012-10-07 ENCOUNTER — Other Ambulatory Visit: Payer: Self-pay | Admitting: Family Medicine

## 2012-10-07 NOTE — Telephone Encounter (Signed)
Ok to refill 

## 2012-10-08 ENCOUNTER — Other Ambulatory Visit: Payer: Self-pay | Admitting: Family Medicine

## 2012-10-08 NOTE — Telephone Encounter (Signed)
plz phone in. 

## 2012-10-08 NOTE — Telephone Encounter (Signed)
Medication phoned to pharmacy.  

## 2012-10-08 NOTE — Telephone Encounter (Signed)
plz phone in. If not already done 

## 2012-10-15 ENCOUNTER — Encounter: Payer: Self-pay | Admitting: *Deleted

## 2012-10-18 ENCOUNTER — Encounter: Payer: Self-pay | Admitting: Family Medicine

## 2012-10-18 ENCOUNTER — Ambulatory Visit (INDEPENDENT_AMBULATORY_CARE_PROVIDER_SITE_OTHER): Payer: Medicare PPO | Admitting: Family Medicine

## 2012-10-18 VITALS — BP 130/80 | HR 100 | Temp 98.2°F | Wt 151.5 lb

## 2012-10-18 DIAGNOSIS — F39 Unspecified mood [affective] disorder: Secondary | ICD-10-CM

## 2012-10-18 NOTE — Patient Instructions (Addendum)
Good to see you today, call us with questions. Check on tetanus shot during your hospitalization. Think about the pneumonia shot. Return in 4-6 months for follow up.

## 2012-10-18 NOTE — Assessment & Plan Note (Signed)
Anticipate depression with anxiety, as well as possible ADHD component. Stable on paxil 30mg  dose and xanax prn. Pt has been using benzo regularly bid, advised to try and back off regular use of this med. Pt agrees to trial of slow wean. RTC 4-6 mo for f/u Update controlled substance agreement today.
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
## 2012-10-28 ENCOUNTER — Encounter: Payer: Self-pay | Admitting: Family Medicine

## 2012-11-08 ENCOUNTER — Other Ambulatory Visit: Payer: Self-pay | Admitting: Family Medicine

## 2012-11-08 NOTE — Telephone Encounter (Signed)
plz phone in. 

## 2012-11-08 NOTE — Telephone Encounter (Signed)
Rx called in as directed.   

## 2012-11-08 NOTE — Telephone Encounter (Signed)
Ok to refill 

## 2012-11-18 ENCOUNTER — Telehealth: Payer: Self-pay | Admitting: Family Medicine

## 2012-11-18 NOTE — Telephone Encounter (Signed)
Great.  Thanks

## 2012-11-18 NOTE — Telephone Encounter (Signed)
Pt has an appmt 02/18/2013 at 4:15 for a follow up but she asked that I make you aware she is currently only needs to take the alprazolam twice a day, instead of three times a day.  She says she's only needed to take it three times a day once.  This is just an FYI that the patient asked me to pass on to you. Thank you.

## 2012-11-25 ENCOUNTER — Encounter: Payer: Self-pay | Admitting: Family Medicine

## 2012-11-25 ENCOUNTER — Ambulatory Visit (INDEPENDENT_AMBULATORY_CARE_PROVIDER_SITE_OTHER): Payer: Medicare PPO | Admitting: Family Medicine

## 2012-11-25 VITALS — BP 130/80 | HR 84 | Temp 99.1°F | Wt 152.0 lb

## 2012-11-25 DIAGNOSIS — F39 Unspecified mood [affective] disorder: Secondary | ICD-10-CM

## 2012-11-25 MED ORDER — LORAZEPAM 1 MG PO TABS: 0.5000 mg | ORAL_TABLET | Freq: Three times a day (TID) | ORAL | Status: DC | PRN
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
## 2012-11-25 NOTE — Patient Instructions (Addendum)
Schedule physical at your convenience, prior fasting for blood work. Continue paroxetine 30mg  daily. Stop alprazolam (xanax).  Start lorazepam 1/2 to 1 tablet up to three times daily as needed. Let's start walking again!

## 2012-11-25 NOTE — Assessment & Plan Note (Addendum)
Adjustment disorder along with anxiety/depression, ?ADHD component. Continue paxil 30mg  daily. Will change benzo to longer acting lorazepam - take 1/2 to 1 tablet of 1mg  twice to three times daily as needed for anxiety. RTC for wellness visit.

## 2012-11-26 ENCOUNTER — Telehealth: Payer: Self-pay | Admitting: Family Medicine

## 2012-11-26 ENCOUNTER — Telehealth: Payer: Self-pay

## 2012-11-26 NOTE — Telephone Encounter (Signed)
Pt brought pill bottles for silenor 3mg  #22 and alprazolam 0.25mg  #18 - discarded in sharps container.

## 2012-11-26 NOTE — Telephone Encounter (Signed)
Pt came in and wanted to review how to take Paxil; printed pt AVS from 11/25/12 and reviewed with pt and she stated she understood how to take Paxil.

## 2012-11-29 DIAGNOSIS — M810 Age-related osteoporosis without current pathological fracture: Secondary | ICD-10-CM | POA: Insufficient documentation

## 2012-11-29 DIAGNOSIS — M8580 Other specified disorders of bone density and structure, unspecified site: Secondary | ICD-10-CM

## 2012-11-29 HISTORY — DX: Other specified disorders of bone density and structure, unspecified site: M85.80

## 2012-12-10 ENCOUNTER — Telehealth: Payer: Self-pay | Admitting: Family Medicine

## 2012-12-10 ENCOUNTER — Encounter: Payer: Medicare PPO | Admitting: Family Medicine

## 2012-12-10 NOTE — Telephone Encounter (Signed)
Confidential Office Message [address] [place], [place] [address] p. [phone] f. [phone] To: [facility] (After Hours Triage) Fax: [phone] From: [facility] Date/ Time: 12/09/2012 9:02 PM Taken By: Mercer Pod, CSR Caller: Mitzi Davenport Facility: not collected Patient: Karen [name], Karen [name] DOB: 1946/03/22 Phone: [phone] Reason for Call: See info below Regarding Appointment: Yes Appt Date: 12/11/2010 Appt Time: 10:30:00 AM Provider: Eustaquio Boyden (Family Practice) Reason: Cancel Appointment Details: has to work needs to reschedule Outcome: Cancelled appointment in EPIC [facility]) Confidential

## 2012-12-16 ENCOUNTER — Encounter: Payer: Self-pay | Admitting: Family Medicine

## 2012-12-16 ENCOUNTER — Ambulatory Visit (INDEPENDENT_AMBULATORY_CARE_PROVIDER_SITE_OTHER): Payer: Medicare PPO | Admitting: Family Medicine

## 2012-12-16 VITALS — BP 126/82 | HR 80 | Temp 98.8°F | Ht 63.5 in | Wt 150.2 lb

## 2012-12-16 DIAGNOSIS — Z23 Encounter for immunization: Secondary | ICD-10-CM

## 2012-12-16 DIAGNOSIS — Z780 Asymptomatic menopausal state: Secondary | ICD-10-CM

## 2012-12-16 DIAGNOSIS — Z0000 Encounter for general adult medical examination without abnormal findings: Secondary | ICD-10-CM

## 2012-12-16 LAB — BASIC METABOLIC PANEL
BUN: 14 mg/dL (ref 6–23)
CO2: 29 mEq/L (ref 19–32)
Calcium: 9 mg/dL (ref 8.4–10.5)
Chloride: 105 mEq/L (ref 96–112)
Creatinine, Ser: 1.2 mg/dL (ref 0.4–1.2)
GFR: 47.06 mL/min — ABNORMAL LOW (ref >60.00)
Glucose, Bld: 85 mg/dL (ref 70–99)
Potassium: 3.7 mEq/L (ref 3.5–5.1)
Sodium: 139 mEq/L (ref 135–145)

## 2012-12-16 LAB — LIPID PANEL
Cholesterol: 203 mg/dL — ABNORMAL HIGH (ref 0–200)
HDL: 55.6 mg/dL (ref >39.00)
Total CHOL/HDL Ratio: 4
Triglycerides: 127 mg/dL (ref 0.0–149.0)
VLDL: 25.4 mg/dL (ref 0.0–40.0)

## 2012-12-16 LAB — LDL CHOLESTEROL, DIRECT: Direct LDL: 135.6 mg/dL

## 2012-12-16 NOTE — Patient Instructions (Addendum)
Flu and pneumonia shots today Bring me copy of living will to update your chart. We will schedule you for a bone density scan. Blood work today. Good to see you today, call us with questions. Sign release of info form for latest colonoscopy from Dr. Loreta Ave to see when next due.
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
## 2012-12-16 NOTE — Assessment & Plan Note (Addendum)
I have personally reviewed the Medicare Annual Wellness questionnaire and have noted 1. The patient's medical and social history 2. Their use of alcohol, tobacco or illicit drugs 3. Their current medications and supplements 4. The patient's functional ability including ADL's, fall risks, home safety risks and hearing or visual impairment. 5. Diet and physical activity 6. Evidence for depression or mood disorders The patients weight, height, BMI have been recorded in the chart.  Hearing and vision has been addressed. I have made referrals, counseling and provided education to the patient based review of the above and I have provided the pt with a written personalized care plan for preventive services. See scanned questionairre. Advanced directives discussed: packet of information provided  Reviewed preventative protocols and updated unless pt declined. Flu and pneumonia shots today. will request records of latest colonoscopy. Schedule DEXA today.

## 2012-12-16 NOTE — Addendum Note (Signed)
Addended by: Josph Macho A on: 12/16/2012 01:03 PM   Modules accepted: Orders

## 2012-12-17 ENCOUNTER — Encounter: Payer: Self-pay | Admitting: *Deleted

## 2012-12-21 ENCOUNTER — Other Ambulatory Visit: Payer: Self-pay | Admitting: Family Medicine

## 2012-12-22 ENCOUNTER — Other Ambulatory Visit: Payer: Self-pay | Admitting: Family Medicine

## 2012-12-22 NOTE — Telephone Encounter (Signed)
Rx called in as directed.   

## 2012-12-28 ENCOUNTER — Telehealth: Payer: Self-pay

## 2012-12-28 ENCOUNTER — Ambulatory Visit (INDEPENDENT_AMBULATORY_CARE_PROVIDER_SITE_OTHER)
Admission: RE | Admit: 2012-12-28 | Discharge: 2012-12-28 | Disposition: A | Discharge status: Home / Self Care | Payer: Medicare PPO | Source: Ambulatory Visit | Attending: Family Medicine | Admitting: Family Medicine

## 2012-12-28 DIAGNOSIS — Z780 Asymptomatic menopausal state: Secondary | ICD-10-CM

## 2012-12-28 NOTE — Telephone Encounter (Signed)
Pt request directions to office for bone density; advised [address]; across from [place] and when enter [place] building go to basement; pt voiced understanding.

## 2012-12-29 ENCOUNTER — Encounter: Payer: Self-pay | Admitting: Family Medicine

## 2013-01-14 ENCOUNTER — Other Ambulatory Visit: Payer: Self-pay | Admitting: Family Medicine

## 2013-01-14 NOTE — Telephone Encounter (Signed)
Pt left v/m requesting refill of lorazepam today due to pt will be out of med on 01/16/13.

## 2013-01-14 NOTE — Telephone Encounter (Signed)
Medication phoned to pharmacy.  

## 2013-01-14 NOTE — Telephone Encounter (Signed)
plz phone in lorazepam and notify patient.

## 2013-01-14 NOTE — Telephone Encounter (Signed)
Patient called for DEXA results. I don't see them in her chart. Have you received them yet?

## 2013-01-14 NOTE — Telephone Encounter (Signed)
Last office visit 12/16/2012.  Ok to refill? 

## 2013-01-14 NOTE — Telephone Encounter (Signed)
Ok to refill? She only has enough to get her through the weekend.

## 2013-01-15 ENCOUNTER — Telehealth: Payer: Self-pay | Admitting: Family Medicine

## 2013-01-15 NOTE — Telephone Encounter (Signed)
I don't have copy of bone density scan - please call to request. plz notify pt lorazepam was sent into pharmacy.

## 2013-01-16 ENCOUNTER — Encounter: Payer: Self-pay | Admitting: Family Medicine

## 2013-01-17 ENCOUNTER — Encounter: Payer: Self-pay | Admitting: *Deleted

## 2013-01-17 NOTE — Telephone Encounter (Signed)
DEXA results received over weekends per Dr. Reece Agar. Message left notifying patient.

## 2013-01-24 ENCOUNTER — Telehealth: Payer: Self-pay

## 2013-01-24 NOTE — Telephone Encounter (Signed)
Pt wants to know if can take lorazepam more often than q8h prn; pt would like to take lorazepam q6h prn. Pt request cb. Pt does not need refill.Please advise.

## 2013-01-25 NOTE — Telephone Encounter (Signed)
Tried to call, left message. I don't recommend increasing frequency of lorazepam to QID.  Recommend bid to tid max.

## 2013-01-25 NOTE — Telephone Encounter (Signed)
Message left notifying patient. Advised if she was needing something more frequently, then she would need an appt.

## 2013-01-26 ENCOUNTER — Telehealth: Payer: Self-pay | Admitting: Family Medicine

## 2013-01-26 NOTE — Telephone Encounter (Signed)
I see - that makes sense.   I will change instructions to read "three times a day as needed" next time we send it in.   Doesn't have to be exactly 8 hours apart, rather just when she feels like she needs to take the medicine.

## 2013-01-26 NOTE — Telephone Encounter (Signed)
Patient said the amount of the Lorazepam is fine.  She didn't want more medicine. She just doesn't understand how to fit the medication in every 8 hours because the 3rd dose is in the middle of the night and she misses that dose.  She wanted to let you know your a "super" doctor.

## 2013-02-07 ENCOUNTER — Other Ambulatory Visit: Payer: Self-pay | Admitting: Family Medicine

## 2013-02-07 NOTE — Telephone Encounter (Signed)
plz phone in. 

## 2013-02-08 ENCOUNTER — Other Ambulatory Visit: Payer: Self-pay | Admitting: Family Medicine

## 2013-02-08 NOTE — Telephone Encounter (Signed)
Rx called in as directed.   

## 2013-02-18 ENCOUNTER — Encounter: Payer: Self-pay | Admitting: Family Medicine

## 2013-02-18 ENCOUNTER — Ambulatory Visit (INDEPENDENT_AMBULATORY_CARE_PROVIDER_SITE_OTHER): Payer: Medicare PPO | Admitting: Family Medicine

## 2013-02-18 VITALS — BP 124/82 | HR 84 | Temp 98.4°F | Wt 154.0 lb

## 2013-02-18 DIAGNOSIS — M8580 Other specified disorders of bone density and structure, unspecified site: Secondary | ICD-10-CM

## 2013-02-18 DIAGNOSIS — Z945 Skin transplant status: Secondary | ICD-10-CM

## 2013-02-18 DIAGNOSIS — Z9889 Other specified postprocedural states: Secondary | ICD-10-CM

## 2013-02-18 DIAGNOSIS — F39 Unspecified mood [affective] disorder: Secondary | ICD-10-CM

## 2013-02-18 DIAGNOSIS — M899 Disorder of bone, unspecified: Secondary | ICD-10-CM

## 2013-02-18 NOTE — Assessment & Plan Note (Signed)
stable on paxil 30mg  daily. Discussed longer acting benzo like klonopin - consider trial of this when pt runs out of current lorazepam script.

## 2013-02-18 NOTE — Progress Notes (Signed)
Pre-visit discussion using our clinic review tool. No additional management support is needed unless otherwise documented below in the visit note.  

## 2013-02-18 NOTE — Patient Instructions (Addendum)
For scar on left forearm - use vaseline and don't pick! I think you are doing very well! Continue meds as up to now. We may discuss changing to longer acting benzodiazepine (klonopin instead of lorazepam) at next visit.

## 2013-02-18 NOTE — Assessment & Plan Note (Signed)
Encouraged weight bearing resistance exercise as well as good calcium/vit D intake daily.

## 2013-02-18 NOTE — Assessment & Plan Note (Signed)
Left forearm with residual contracture, now concern for developing poorly healing wound 2/2 self picking Advised treat with vaseline and stop picking.
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
## 2013-02-23 ENCOUNTER — Telehealth: Payer: Self-pay

## 2013-02-23 NOTE — Telephone Encounter (Signed)
Pt started with slight puffiness under rt eye; pt has no problem with vision, and eye is not swollen. Pt has some sinus congestion and T is 99.4. No h/a and no cough. pt has used nasal saline spray.[facility] Pt cannot come in for appt today but will call 01/25/13 if no better but wants to know if there is a suggestion for med to take in meantime.Please advise.

## 2013-02-23 NOTE — Telephone Encounter (Signed)
Pt came to office requesting a doctor to look at her eye, I advise pt that all the Dr. are seeing pt's and no more appts available today, I advise pt that she should try mucinex to help with congestion, and to either go to UC or ER for worsening today. Pt said she isn't going to go to UC or ER, so I advise pt to schedule a f/u appt for Friday, pt said that's what she was going to do, pt scheduled appt and left

## 2013-02-23 NOTE — Telephone Encounter (Signed)
If she worsens or develops redness of skin- needs to be seen today Otherwise come in when she can/ and mucinex may help congestion

## 2013-02-25 ENCOUNTER — Encounter: Payer: Self-pay | Admitting: Family Medicine

## 2013-02-25 ENCOUNTER — Ambulatory Visit (INDEPENDENT_AMBULATORY_CARE_PROVIDER_SITE_OTHER): Payer: Medicare PPO | Admitting: Family Medicine

## 2013-02-25 VITALS — BP 116/86 | HR 85 | Temp 98.0°F | Wt 153.2 lb

## 2013-02-25 DIAGNOSIS — J069 Acute upper respiratory infection, unspecified: Secondary | ICD-10-CM

## 2013-02-25 NOTE — Assessment & Plan Note (Signed)
Likely viral, nontoxic.  She looks like she had a small allergic shiner, but from the URI instead of allergies.  Should resolve.  F/u prn.  Supportive care.

## 2013-02-25 NOTE — Patient Instructions (Signed)
She swelling should get better gradually.  Drink plenty of fluids, take tylenol as needed, and gargle with warm salt water for your throat.  Take care.  Let us know if you have other concerns.

## 2013-02-25 NOTE — Progress Notes (Signed)
Pre-visit discussion using our clinic review tool. No additional management support is needed unless otherwise documented below in the visit note.  Puffy under R eye, better today.  Sx started about 2 days.  Cough, some sputum. Voice is hoarse.  99.8 temp last night. Mult sick contacts, sub teacher.  No ear pain.  No rash.  No ST.    Meds, vitals, and allergies reviewed.   ROS: See HPI.  Otherwise, noncontributory.  GEN: nad, alert and oriented HEENT: mucous membranes moist, tm w/o erythema, nasal exam w/o erythema, clear discharge noted,  OP with cobblestoning, sinuses not ttp x4.  Minimally puffy under R eye, not fluctuant or red.  EOMI, PERRL, conjunctiva wnl B NECK: supple w/o LA CV: rrr.   PULM: ctab, no inc wob EXT: no edema SKIN: no acute rash, chronic changes on skin in L arm noted.

## 2013-02-28 ENCOUNTER — Telehealth: Payer: Self-pay

## 2013-02-28 MED ORDER — CLONAZEPAM 1 MG PO TABS: 1.0000 mg | ORAL_TABLET | Freq: Two times a day (BID) | ORAL | Status: DC | PRN

## 2013-02-28 NOTE — Telephone Encounter (Signed)
Pt left v/m that she has 2 pills left of lorazepam; pt said that at 02/18/13 visit Dr Reece Agar discussed with pt about changing lorazepam to longer acting Klonopin. Pt does not want to try Xanax. Pt request new med sent to [facility].Please advise.

## 2013-02-28 NOTE — Telephone Encounter (Signed)
Rx called in as directed and message left notifying patient. 

## 2013-02-28 NOTE — Telephone Encounter (Signed)
plz notify I've sent in klonopin and plz phone in klonopin. To take twice daily as needed, no more than two per day because this is long acting med.

## 2013-03-03 ENCOUNTER — Telehealth: Payer: Self-pay | Admitting: Family Medicine
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
## 2013-03-03 NOTE — Telephone Encounter (Signed)
Will see tomorrow

## 2013-03-04 ENCOUNTER — Ambulatory Visit: Payer: Self-pay | Admitting: Family Medicine

## 2013-03-08 ENCOUNTER — Encounter: Payer: Self-pay | Admitting: Family Medicine

## 2013-03-08 ENCOUNTER — Ambulatory Visit (INDEPENDENT_AMBULATORY_CARE_PROVIDER_SITE_OTHER): Payer: Medicare PPO | Admitting: Family Medicine

## 2013-03-08 VITALS — BP 134/80 | HR 96 | Temp 98.0°F | Wt 155.5 lb

## 2013-03-08 DIAGNOSIS — M899 Disorder of bone, unspecified: Secondary | ICD-10-CM

## 2013-03-08 DIAGNOSIS — J3489 Other specified disorders of nose and nasal sinuses: Secondary | ICD-10-CM | POA: Insufficient documentation

## 2013-03-08 DIAGNOSIS — M8580 Other specified disorders of bone density and structure, unspecified site: Secondary | ICD-10-CM

## 2013-03-08 DIAGNOSIS — J069 Acute upper respiratory infection, unspecified: Secondary | ICD-10-CM

## 2013-03-08 DIAGNOSIS — F39 Unspecified mood [affective] disorder: Secondary | ICD-10-CM

## 2013-03-08 NOTE — Assessment & Plan Note (Signed)
Encouraged she continue weight bearing exercises

## 2013-03-08 NOTE — Assessment & Plan Note (Signed)
Resolving
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
## 2013-03-08 NOTE — Progress Notes (Signed)
Pre-visit discussion using our clinic review tool. No additional management support is needed unless otherwise documented below in the visit note.  

## 2013-03-08 NOTE — Patient Instructions (Signed)
I would start using vaseline ointment to nose twice daily - if not improving in 1-2 weeks let me know for referral to dermatologist. I think you are doing well with paxil and klonopin.

## 2013-03-08 NOTE — Assessment & Plan Note (Signed)
Reviewed dosing of klonopin. Continue paxil 30.
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
## 2013-03-22 ENCOUNTER — Ambulatory Visit (INDEPENDENT_AMBULATORY_CARE_PROVIDER_SITE_OTHER): Payer: Medicare PPO | Admitting: Family Medicine

## 2013-03-22 ENCOUNTER — Encounter: Payer: Self-pay | Admitting: Family Medicine

## 2013-03-22 VITALS — BP 120/80 | HR 81 | Temp 98.9°F | Wt 156.0 lb

## 2013-03-22 DIAGNOSIS — F39 Unspecified mood [affective] disorder: Secondary | ICD-10-CM

## 2013-03-22 DIAGNOSIS — M8580 Other specified disorders of bone density and structure, unspecified site: Secondary | ICD-10-CM

## 2013-03-22 DIAGNOSIS — M899 Disorder of bone, unspecified: Secondary | ICD-10-CM

## 2013-03-22 NOTE — Assessment & Plan Note (Signed)
Daughter brings up concerns about memory impairment, MMSE performed today 29/30 - but discussed not very sensitive for mild or early dementia.  No fmhx dementia. Will continue to monitor, I recommended pt do memory games like word puzzles and sudoku and continue reading. Reviewed klonopin dosing - continue paxil 30mg  daily.
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
## 2013-03-22 NOTE — Progress Notes (Signed)
Pre-visit discussion using our clinic review tool. No additional management support is needed unless otherwise documented below in the visit note.  

## 2013-03-22 NOTE — Assessment & Plan Note (Signed)
Reviewed cal/vit D dosing 

## 2013-03-22 NOTE — Patient Instructions (Addendum)
Use klonopin as needed 1/2-1 tablet once to twice daily. We will keep an eye on memory. Continue vitamins daily.

## 2013-03-23 ENCOUNTER — Telehealth: Payer: Self-pay

## 2013-03-23 NOTE — Telephone Encounter (Signed)
Noted. Thanks.

## 2013-03-23 NOTE — Telephone Encounter (Signed)
Pt left v/m to let Dr Reece Agar know that pt does not want to cut klonopin in half and pt plans to take 1 pill twice a day as needed. Pt saw Dr Reece Agar 03/23/13 and pt was told that would be OK. Pt does not need cb unless Dr Reece Agar thinks this is not correct.

## 2013-03-28 ENCOUNTER — Telehealth: Payer: Self-pay | Admitting: Family Medicine

## 2013-03-28 MED ORDER — LORAZEPAM 1 MG PO TABS: 0.5000 mg | ORAL_TABLET | Freq: Three times a day (TID) | ORAL | Status: DC | PRN

## 2013-03-28 NOTE — Telephone Encounter (Signed)
Pt is calling and wanting a call from Selena Batten if possible. Pt says she wanted to explain it all to you anout medications ???

## 2013-03-28 NOTE — Telephone Encounter (Signed)
Spoke with patient. Took klonopin too soon, felt ill with this "couldn't think straight:" but unable to further elucidate sxs. Will stop klonopin and restart lorazepam tomorrow. Kim please call lorazepam to be filled 03/30/2013. Order in chart.

## 2013-03-28 NOTE — Telephone Encounter (Signed)
Pt had left v/m requesting call.Left v/m requesting cb.

## 2013-03-28 NOTE — Telephone Encounter (Signed)
Spoke with patient. She said she "doesn't feel good". When I asked her to elaborate she said she is depressed,worries all the time and is at the end of what she knows to do. She denies SI/HI. She doesn't feel that the klonopin is helping as much as the xanax because she has to wait 12 hours in between pills. She feels she needs to take something more frequently and wants to go back on the xanax. She says "it's time for help" and I asked if she felt she needed counseling and she refused. She only needed medication more frequently. She said she took the klonopin too close together the other day and felt horrible. She asked the pharmacist how long she should've waited in between doses and they advised her 12 hours. She said she didn't know that since the directions on the bottle said " 1 twice daily as needed". She felt that after she took one and needed another a few hours later, she could take one since it said "as needed" and she "needed" it at that specific time.   **I think giving specific times for her to take the meds on her labels may work better for her as she seems to have trouble understanding the PRN dosing with every med I've talked with her about** (ex: Take 1 tablet at 8am and another tablet at 8pm if required)

## 2013-03-29 NOTE — Telephone Encounter (Signed)
Just confirming before I call this in- fill today or tomorrow? You said she is to start today, but then the directions said to fill tomorrow.

## 2013-03-29 NOTE — Telephone Encounter (Signed)
She has extra pills at home.  plz phone in today with start date 03/30/2013.

## 2013-03-29 NOTE — Telephone Encounter (Signed)
Rx called in as directed.   

## 2013-04-05 ENCOUNTER — Telehealth: Payer: Self-pay | Admitting: Family Medicine

## 2013-04-05 NOTE — Telephone Encounter (Signed)
Patient Information:  Caller Name: Jamayia  Phone: [phone]  Patient: Karen [name], Karen [name]  Gender: Female  DOB: Feb 13, 1946  Age: 68 Years  PCP: Ria Bush [facility])  Office Follow Up:  Does the office need to follow up with this patient?: No  Instructions For The Office: N/A  RN Note:  Advised pt Treats anxiety, anxiety with depression, and insomnia (trouble sleeping).  Symptoms  Reason For Call & Symptoms: Pt asking question about Rx of Lorazepam 1 mg  Take 1/2-1 tab po q 8 hrs  prn for anxiety.  Reviewed Health History In EMR: Yes  Reviewed Medications In EMR: Yes  Reviewed Allergies In EMR: Yes  Reviewed Surgeries / Procedures: Yes  Date of Onset of Symptoms: 04/05/2013  Guideline(s) Used:  No Protocol Available - Information Only  Disposition Per Guideline:   Home Care  Reason For Disposition Reached:   Information only question and nurse able to answer  Advice Given:  Call Back If:  New symptoms develop  You become worse.  Patient Will Follow Care Advice:  YES

## 2013-04-09 ENCOUNTER — Other Ambulatory Visit: Payer: Self-pay | Admitting: Family Medicine

## 2013-04-14 ENCOUNTER — Ambulatory Visit: Payer: Medicare PPO | Admitting: Family Medicine

## 2013-04-18 ENCOUNTER — Encounter: Payer: Self-pay | Admitting: Family Medicine

## 2013-04-18 ENCOUNTER — Ambulatory Visit (INDEPENDENT_AMBULATORY_CARE_PROVIDER_SITE_OTHER): Payer: Medicare PPO | Admitting: Family Medicine

## 2013-04-18 VITALS — BP 118/82 | HR 110 | Temp 98.4°F | Wt 155.0 lb

## 2013-04-18 DIAGNOSIS — F4323 Adjustment disorder with mixed anxiety and depressed mood: Secondary | ICD-10-CM

## 2013-04-18 MED ORDER — NORTRIPTYLINE HCL 25 MG PO CAPS: 25.0000 mg | ORAL_CAPSULE | Freq: Every day | ORAL | Status: DC

## 2013-04-18 NOTE — Progress Notes (Signed)
Pre-visit discussion using our clinic review tool. No additional management support is needed unless otherwise documented below in the visit note.  

## 2013-04-18 NOTE — Assessment & Plan Note (Signed)
Persistent depression sxs - continue paroxetine 30mg  daily with lorazepam as needed up to tid Start nortriptyline 25mg  nightly. Return to see me in 1 month.

## 2013-04-18 NOTE — Patient Instructions (Addendum)
Continue paroxetine 30mg  daily.   Continue lorazepam 1 tablet up to 3 a day - but only when you feel you need to take a medicine for anxiety.  May try trial off am and/or pm doses. Start nortriptyline 25mg  nightly - let's do trial of this medication for mood Continue to think about counseling - I think this would be beneficial. Return to see me in 1 month for follow up.
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
## 2013-04-19 ENCOUNTER — Telehealth: Payer: Self-pay

## 2013-04-19 NOTE — Telephone Encounter (Signed)
Noted. Agree with stopping nortriptyline.  Will continue paxil and lorazepam for now as previously prescribed.  Will discuss other options at f/u visit.

## 2013-04-19 NOTE — Telephone Encounter (Signed)
Pt was seen 04/18/13 and last night pt took Nortriptyline 25 mg at bedtime and this AM feels very jittery. No CP or SOB. Pt said she is not going to take any more Nortriptyline; pt will continue taking Paroxetine 30 mg one daily.Pt has taken Lorazepam 1 mg taking 1/2 tab this AM and she thinks that has helped the jitteriness. Pt does not want to leave pharmacy because does not want any new meds. Pt request cb with Dr Synthia Innocent advice.

## 2013-04-19 NOTE — Telephone Encounter (Signed)
Patient notified as instructed by telephone. Patient stated that she is doing much better.

## 2013-04-20 ENCOUNTER — Telehealth: Payer: Self-pay

## 2013-04-20 ENCOUNTER — Telehealth: Payer: Self-pay | Admitting: Family Medicine

## 2013-04-20 ENCOUNTER — Other Ambulatory Visit: Payer: Self-pay | Admitting: Family Medicine

## 2013-04-20 MED ORDER — LORAZEPAM 1 MG PO TABS: ORAL_TABLET | ORAL | Status: DC

## 2013-04-20 NOTE — Telephone Encounter (Signed)
Patient notified

## 2013-04-20 NOTE — Telephone Encounter (Addendum)
plz phone in 1/2 tablet dose.

## 2013-04-20 NOTE — Telephone Encounter (Signed)
Ok to do.  See refill request. I want her to take 1/2 tablet as needed, up to 3 a day is ok but try to minimize use.

## 2013-04-20 NOTE — Telephone Encounter (Signed)
Pt has cut back her Lorazepam (1 mg tabs) to 1/2 tab every 8 hours and wants to verify that she is cutting back correctly.  Pt would like to talk to you about this to be sure she is correct.  She says 1/2 mg is not lasting quite 8 hours.  Can you contact pt to discuss? I let her know you were out of the office the rest of today and she says tomorrow is ok for you to call. I also xferred pt to triage.

## 2013-04-20 NOTE — Telephone Encounter (Signed)
Rx called in as directed.   

## 2013-04-20 NOTE — Telephone Encounter (Signed)
Ok to refill 

## 2013-04-20 NOTE — Telephone Encounter (Signed)
Pt called back pt wants to clarify if can take Lorazepam 1/2 pill q8h as needed. Advised pt that is what prescription should read and pt voiced understanding. Pt said [facility] was [facility]; advised pt [facility] requested [facility]ill this morning.(on another phone note). Pt request cb to verify OK with Dr Darnell Level to take med as discussed and to let pt know when Lorazepam was refilled.

## 2013-04-20 NOTE — Telephone Encounter (Signed)
Pt left v/m wanting Dr Darnell Level to know that pt has cut back on Lorazepam; pt is taking Lorazepam 1 mg pt took one pill on 04/19/13; if that is not OK pt request call back.

## 2013-04-21 ENCOUNTER — Telehealth: Payer: Self-pay

## 2013-04-21 NOTE — Telephone Encounter (Addendum)
May increase lorazepam to 1mg  if needed up to 3 times a day but I want #60 to last the entire month.  Would wait to increase at night time dose tonight.
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
## 2013-04-21 NOTE — Telephone Encounter (Signed)
See next phone note.

## 2013-04-21 NOTE — Telephone Encounter (Signed)
Patient notified. But now she thinks a half of a pill will be fine. So, she is probably going to stick with that.

## 2013-04-26 ENCOUNTER — Ambulatory Visit: Payer: Medicare PPO | Admitting: Family Medicine

## 2013-04-27 ENCOUNTER — Telehealth: Payer: Self-pay | Admitting: Family Medicine

## 2013-04-27 NOTE — Telephone Encounter (Signed)
Patient said she's taking 2 Lorazepam a day for the last 2 days.  Patient's trying to back off of her medication.

## 2013-05-06 ENCOUNTER — Telehealth: Payer: Self-pay

## 2013-05-06 NOTE — Telephone Encounter (Signed)
Pt left v/m; [facility] called pt wanting to know if pt needed refill on paroxetine: pt does not need refill on paroxetine and pt has enough lorazepam for 12 more days. Pt does not need call back but wanted [place] to know that she has never gotten call from pharmacy asking if needed refill before. Spoke with [facility] and the local pharmacy did not call pt but said the corporate office may have called pt and not to be concerned. Pt will contact [facility] when needs refill. Pt voiced understanding.

## 2013-05-14 ENCOUNTER — Other Ambulatory Visit: Payer: Self-pay | Admitting: Family Medicine

## 2013-05-15 ENCOUNTER — Other Ambulatory Visit: Payer: Self-pay | Admitting: Family Medicine

## 2013-05-15 NOTE — Telephone Encounter (Signed)
plz phone in. 

## 2013-05-16 NOTE — Telephone Encounter (Signed)
Rx called in as directed.   

## 2013-05-19 ENCOUNTER — Ambulatory Visit: Payer: Medicare PPO | Admitting: Family Medicine

## 2013-05-20 ENCOUNTER — Encounter: Payer: Self-pay | Admitting: Family Medicine

## 2013-05-20 ENCOUNTER — Telehealth: Payer: Self-pay

## 2013-05-20 ENCOUNTER — Ambulatory Visit (INDEPENDENT_AMBULATORY_CARE_PROVIDER_SITE_OTHER): Payer: Medicare PPO | Admitting: Family Medicine

## 2013-05-20 VITALS — BP 130/84 | HR 88 | Temp 98.0°F | Wt 155.5 lb

## 2013-05-20 DIAGNOSIS — M25511 Pain in right shoulder: Secondary | ICD-10-CM

## 2013-05-20 DIAGNOSIS — F4323 Adjustment disorder with mixed anxiety and depressed mood: Secondary | ICD-10-CM

## 2013-05-20 DIAGNOSIS — Z9889 Other specified postprocedural states: Secondary | ICD-10-CM

## 2013-05-20 DIAGNOSIS — Z945 Skin transplant status: Secondary | ICD-10-CM

## 2013-05-20 DIAGNOSIS — M25519 Pain in unspecified shoulder: Secondary | ICD-10-CM

## 2013-05-20 NOTE — Telephone Encounter (Signed)
Pt called to verify she is to continue taking paroxetine 30 mg daily and lorazepam 1 mg taking 1 tab in AM and 1/2 - 1 tab during the rest of the day. Advised pt that was instructions from office note and pt scheduled f/u 30 min appt on 06/17/13 at 12 noon. Pt voiced understanding.

## 2013-05-20 NOTE — Patient Instructions (Addendum)
Let's watch dark spot on right arm scar tissue, let me know if not clearing. You may have a bit of inflammation of your right rotator cuff - do stretching exercises provided today.  May use ibuprofen or tylenol over the counter as needed.  Let me know if not improving with this. Continue paroxetine 30mg  and lorazepam as you're taking. Return to see me in 4-6 weeks.

## 2013-05-20 NOTE — Assessment & Plan Note (Signed)
Stable period on current regimen. Continue paroxetine 30mg  daily, lorazepam 1mg  (pt takes 1 tab in am and 1/2 - 1 tab during rest of day). rtc 4-6 wks for f/u.

## 2013-05-20 NOTE — Progress Notes (Signed)
Pre visit review using our clinic review tool, if applicable. No additional management support is needed unless otherwise documented below in the visit note. 

## 2013-05-20 NOTE — Assessment & Plan Note (Signed)
Anticipate mild RTC tendonitis, treat with OTC tylenol or ibuprofen and provided with SM pt advisor exercises on RTC injury.
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
## 2013-05-30 ENCOUNTER — Ambulatory Visit: Payer: Medicare PPO | Admitting: Family Medicine

## 2013-06-17 ENCOUNTER — Encounter: Payer: Self-pay | Admitting: Family Medicine

## 2013-06-17 ENCOUNTER — Ambulatory Visit (INDEPENDENT_AMBULATORY_CARE_PROVIDER_SITE_OTHER): Payer: Medicare PPO | Admitting: Family Medicine

## 2013-06-17 VITALS — BP 126/72 | HR 80 | Temp 98.1°F | Ht 63.5 in | Wt 158.2 lb

## 2013-06-17 DIAGNOSIS — Z945 Skin transplant status: Secondary | ICD-10-CM

## 2013-06-17 DIAGNOSIS — Z9889 Other specified postprocedural states: Secondary | ICD-10-CM

## 2013-06-17 DIAGNOSIS — M8580 Other specified disorders of bone density and structure, unspecified site: Secondary | ICD-10-CM

## 2013-06-17 DIAGNOSIS — M899 Disorder of bone, unspecified: Secondary | ICD-10-CM

## 2013-06-17 DIAGNOSIS — F4323 Adjustment disorder with mixed anxiety and depressed mood: Secondary | ICD-10-CM

## 2013-06-17 DIAGNOSIS — M949 Disorder of cartilage, unspecified: Secondary | ICD-10-CM

## 2013-06-17 DIAGNOSIS — M25519 Pain in unspecified shoulder: Secondary | ICD-10-CM

## 2013-06-17 DIAGNOSIS — M25511 Pain in right shoulder: Secondary | ICD-10-CM

## 2013-06-17 MED ORDER — PAROXETINE HCL 40 MG PO TABS: ORAL_TABLET | ORAL | Status: DC

## 2013-06-17 NOTE — Assessment & Plan Note (Signed)
Did not do HEP provided last visit so did not have fair trial for improvement.  Encouraged to be regular with exercises previously provided.
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
## 2013-06-17 NOTE — Assessment & Plan Note (Signed)
Again reviewed dx, encouraged regular cal/vit D intake.

## 2013-06-17 NOTE — Assessment & Plan Note (Signed)
Chronic.  Pt interested in increase in paxil in hopes of continuing taper off lorazepam - will increase to 40mg  daily. rtc 4-6 wk for f/u.

## 2013-06-17 NOTE — Assessment & Plan Note (Signed)
Reassured no evidence of infection today, but discussed how this will likely not improve over time

## 2013-06-17 NOTE — Patient Instructions (Addendum)
Let's increase paxil to 40mg  daily (new dose is at pharmacy). Trial of this for 2-3 weeks, if doing really well, may try to decrease lorazepam (as tolerated).  Could try 1/2 tablet in morning and 1/2 at night if needed. Return in 4-6 weeks for follow up visit. Remember to take calcium and vitamin d daily.

## 2013-06-22 ENCOUNTER — Other Ambulatory Visit: Payer: Self-pay | Admitting: Family Medicine

## 2013-06-22 NOTE — Telephone Encounter (Signed)
plz phone in. 

## 2013-06-23 NOTE — Telephone Encounter (Signed)
Rx called in as prescribed 

## 2013-07-12 ENCOUNTER — Encounter: Payer: Self-pay | Admitting: Family Medicine

## 2013-07-12 ENCOUNTER — Ambulatory Visit: Payer: Medicare PPO | Admitting: Family Medicine

## 2013-07-12 ENCOUNTER — Ambulatory Visit (INDEPENDENT_AMBULATORY_CARE_PROVIDER_SITE_OTHER): Payer: Medicare PPO | Admitting: Family Medicine

## 2013-07-12 VITALS — BP 136/96 | HR 88 | Temp 98.2°F | Wt 156.5 lb

## 2013-07-12 DIAGNOSIS — M8580 Other specified disorders of bone density and structure, unspecified site: Secondary | ICD-10-CM

## 2013-07-12 DIAGNOSIS — F4323 Adjustment disorder with mixed anxiety and depressed mood: Secondary | ICD-10-CM

## 2013-07-12 DIAGNOSIS — M949 Disorder of cartilage, unspecified: Secondary | ICD-10-CM

## 2013-07-12 DIAGNOSIS — M899 Disorder of bone, unspecified: Secondary | ICD-10-CM

## 2013-07-12 NOTE — Progress Notes (Signed)
   BP 136/96  Pulse 88  Temp(Src) 98.2 F (36.8 C) (Oral)  Wt 156 lb 8 oz (70.988 kg)   CC: 1 mo f/u  Subjective:    Patient ID: Karen [name], female    DOB: March 02, 1946, 68 y.o.   MRN: [id number]  HPI: Karen [name] is a 68 y.o. female presenting on 07/12/2013 for [place] presents today as a 1 mo f/u for her mood disorder. She has history of adjustment disorder along with anxiety/depression. ?ADHD component.  Last visit we increased paxil to 40mg  daily.   She continues lorazepam 1mg  1/2 tablet every 8 hours as needed with extra dose if needed #60 per month.  She has been taking 1 tablet in morning and occasionally 1/2 in evenings.  2 mi walking daily.  Walks with friend.  BP Readings from Last 3 Encounters:  07/12/13 136/96  06/17/13 126/72  05/20/13 130/84  Attributes increase in bp to recent social interaction with another patient in lobby - was laughing and cutting it up.  Relevant past medical, surgical, family and social history reviewed and updated as indicated.  Allergies and medications reviewed and updated. Current Outpatient Prescriptions on File Prior to Visit  Medication Sig  . aspirin 325 MG tablet Take 325 mg by mouth as needed for moderate pain.   Marland Kitchen BIOTIN PO Take 1 tablet by mouth daily.  . Calcium Carb-Cholecalciferol (CALCIUM-VITAMIN D) 600-400 MG-UNIT TABS Take 1 tablet by mouth 2 (two) times daily.  . cholecalciferol (VITAMIN D) 1000 UNITS tablet Take 1,000 Units by mouth daily.  Marland Kitchen LORazepam (ATIVAN) 1 MG tablet TAKE ONE-HALF TABLET BY MOUTH EVERY 8 HOURS AS NEEDED FOR ANXIETY AND EXTRA 1/2 TAB IF NEEDED  . NON FORMULARY Take 1 tablet by mouth daily. shaklee multivitamin  . PARoxetine (PAXIL) 40 MG tablet TAKE 1 TABLET (30 MG TOTAL) BY MOUTH EVERY MORNING.   No current facility-administered medications on file prior to visit.    Review of Systems Per HPI unless specifically indicated above    Objective:    BP 136/96  Pulse 88   Temp(Src) 98.2 F (36.8 C) (Oral)  Wt 156 lb 8 oz (70.988 kg)  Physical Exam  Nursing note and vitals reviewed. Constitutional: She appears well-developed and well-nourished. No distress.  Skin: Skin is warm and dry. No rash noted.  Psychiatric: She has a normal mood and affect.   Results for orders placed in visit on 12/29/12  HM COLONOSCOPY      Result Value Ref Range   HM Colonoscopy [name]        Assessment & Plan:   Problem List Items Addressed This Visit   Adjustment disorder with mixed anxiety and depressed mood - Primary     Chronic, stable on current regimen - continue.    Osteopenia     Again reviewed need for cal/vit D in diet.  Borderline T score last DEXA         Follow up plan: Return if symptoms worsen or fail to improve.

## 2013-07-12 NOTE — Progress Notes (Signed)
Pre visit review using our clinic review tool, if applicable. No additional management support is needed unless otherwise documented below in the visit note. 

## 2013-07-12 NOTE — Patient Instructions (Addendum)
I think you are doing well today! No changes today. Bring me copy of advanced directive/living will. Good to see you today, return in 4-6 weeks for follow up.

## 2013-07-12 NOTE — Assessment & Plan Note (Signed)
Chronic, stable on current regimen - continue. 

## 2013-07-12 NOTE — Assessment & Plan Note (Signed)
Again reviewed need for cal/vit D in diet.  Borderline T score last DEXA

## 2013-07-28 ENCOUNTER — Other Ambulatory Visit: Payer: Self-pay

## 2013-07-28 DIAGNOSIS — Z1231 Encounter for screening mammogram for malignant neoplasm of breast: Secondary | ICD-10-CM

## 2013-08-04 ENCOUNTER — Other Ambulatory Visit: Payer: Self-pay | Admitting: Family Medicine

## 2013-08-04 NOTE — Telephone Encounter (Signed)
plz phone in. 

## 2013-08-05 NOTE — Telephone Encounter (Signed)
Rx called in as directed.   

## 2013-08-11 ENCOUNTER — Ambulatory Visit (INDEPENDENT_AMBULATORY_CARE_PROVIDER_SITE_OTHER): Payer: Medicare PPO | Admitting: Family Medicine

## 2013-08-11 ENCOUNTER — Encounter: Payer: Self-pay | Admitting: Family Medicine

## 2013-08-11 VITALS — BP 126/84 | HR 84 | Temp 98.6°F | Wt 156.0 lb

## 2013-08-11 DIAGNOSIS — F4323 Adjustment disorder with mixed anxiety and depressed mood: Secondary | ICD-10-CM

## 2013-08-11 DIAGNOSIS — J3489 Other specified disorders of nose and nasal sinuses: Secondary | ICD-10-CM

## 2013-08-11 DIAGNOSIS — R0981 Nasal congestion: Secondary | ICD-10-CM

## 2013-08-11 NOTE — Progress Notes (Signed)
BP 126/84  Pulse 84  Temp(Src) 98.6 F (37 C) (Oral)  Wt 156 lb (70.761 kg)   CC: 1 mo f/u  Subjective:    Patient ID: Karen [name], female    DOB: [birth year], 68 y.o.   MRN: [id number]  HPI: Karen [name] is a 68 y.o. female presenting on 08/11/2013 for [place] presents today as a 1 mo f/u for her mood disorder. She has history of adjustment disorder along with anxiety/depression. ?ADHD component. Wonders about lorazepam causing memory troubles.  Trying to back off this.  Last visit we increased paxil to 40mg  daily.   She continues lorazepam 1mg  in am with extra dose if needed #60 per month - this is a decrease from prior.  She has been taking 1 tablet in morning and rarely 1/2 in evenings.  Some hoarse throat and snoring.  No apneic episodes.  Denies daytime somnolence.  Restorative sleeping endorsed.  Trouble singing "my voice won't come out". Seldom takes antihistamine. + PNdrainage and rhinorrhea and congestion.  No eye sxs. 1 mo ago noticed increased congestion.  Relevant past medical, surgical, family and social history reviewed and updated as indicated.  Allergies and medications reviewed and updated. Current Outpatient Prescriptions on File Prior to Visit  Medication Sig  . BIOTIN PO Take 1 tablet by mouth daily.  . Calcium Carb-Cholecalciferol (CALCIUM-VITAMIN D) 600-400 MG-UNIT TABS Take 1 tablet by mouth 2 (two) times daily.  . cholecalciferol (VITAMIN D) 1000 UNITS tablet Take 1,000 Units by mouth daily.  Marland Kitchen LORazepam (ATIVAN) 1 MG tablet TAKE 1/2 TABLET BY MOUTH EVERY 8HRS AS NEEDED FOR ANXIETY AND 1/2 TABLET ADDITIONAL IF NEEDED  . PARoxetine (PAXIL) 40 MG tablet TAKE 1 TABLET (30 MG TOTAL) BY MOUTH EVERY MORNING.  . vitamin E 200 UNIT capsule Take 400 Units by mouth daily.  . NON FORMULARY Take 1 tablet by mouth daily. shaklee multivitamin   No current facility-administered medications on file prior to visit.    Review of Systems Per HPI unless  specifically indicated above    Objective:    BP 126/84  Pulse 84  Temp(Src) 98.6 F (37 C) (Oral)  Wt 156 lb (70.761 kg)  Physical Exam  Nursing note and vitals reviewed. Constitutional: She appears well-developed and well-nourished. No distress.  HENT:  Head: Normocephalic and atraumatic.  Nose: Mucosal edema present. No rhinorrhea. Right sinus exhibits no maxillary sinus tenderness and no frontal sinus tenderness. Left sinus exhibits no maxillary sinus tenderness and no frontal sinus tenderness.  Mouth/Throat: Uvula is midline, oropharynx is clear and moist and mucous membranes are normal. No oropharyngeal exudate, posterior oropharyngeal edema, posterior oropharyngeal erythema or tonsillar abscesses.  Eyes: Conjunctivae and EOM are normal. Pupils are equal, round, and reactive to light. No scleral icterus.  Neck: Normal range of motion. Neck supple. No thyromegaly present.  Lymphadenopathy:    She has no cervical adenopathy.  Skin: Skin is warm and dry. No rash noted.  Psychiatric: She has a normal mood and affect.   Results for orders placed in visit on 12/29/12  HM COLONOSCOPY      Result Value Ref Range   HM Colonoscopy [name]        Assessment & Plan:   Problem List Items Addressed This Visit   Adjustment disorder with mixed anxiety and depressed mood - Primary     Chronic, stable. Continue regimen.  Backing off ativan - down to 1 tab daily.    Nasal congestion  With snoring and some hoarseness endorsed.  Anticipate allergic rhinitis related - rec start trial of daily antihistamine for 2-3 wks (suggested allegra), update if not better.        Follow up plan: Return in about 5 weeks (around 09/15/2013), or if symptoms worsen or fail to improve, for follow up visit.

## 2013-08-11 NOTE — Assessment & Plan Note (Signed)
With snoring and some hoarseness endorsed.  Anticipate allergic rhinitis related - rec start trial of daily antihistamine for 2-3 wks (suggested allegra), update if not better.

## 2013-08-11 NOTE — Patient Instructions (Addendum)
03-1998 units vit D daily is ok. I suggest taking allegra one pill daily for 2-3 week trial (may start with med you have at home). If that doesn't help, may try plain mucinex or immediate release guaifenesin with plenty of fluids for nasal congestion. Good to see you today.  Return in 4-6 weeks for follow up.

## 2013-08-11 NOTE — Assessment & Plan Note (Signed)
Chronic, stable. Continue regimen.  Backing off ativan - down to 1 tab daily.

## 2013-08-11 NOTE — Progress Notes (Signed)
Pre visit review using our clinic review tool, if applicable. No additional management support is needed unless otherwise documented below in the visit note. 

## 2013-09-01 ENCOUNTER — Telehealth: Payer: Self-pay

## 2013-09-01 NOTE — Telephone Encounter (Signed)
Pt left v/m; pt bought allergy relief med today, cetirizine HCL 10 mg antihistamine and pt does not want to take med without Dr Synthia Innocent OK. Pt request cb.

## 2013-09-01 NOTE — Telephone Encounter (Signed)
Ok to take - may be sedating so would start at night time. plz update med list

## 2013-09-02 NOTE — Telephone Encounter (Signed)
Message left notifying patient and med list updated.

## 2013-09-08 ENCOUNTER — Ambulatory Visit (INDEPENDENT_AMBULATORY_CARE_PROVIDER_SITE_OTHER): Payer: Medicare PPO | Admitting: Family Medicine

## 2013-09-08 ENCOUNTER — Ambulatory Visit: Payer: Medicare PPO | Admitting: Family Medicine

## 2013-09-08 ENCOUNTER — Encounter: Payer: Self-pay | Admitting: Family Medicine

## 2013-09-08 VITALS — BP 134/80 | HR 80 | Temp 98.4°F | Wt 159.8 lb

## 2013-09-08 DIAGNOSIS — F4323 Adjustment disorder with mixed anxiety and depressed mood: Secondary | ICD-10-CM

## 2013-09-08 NOTE — Assessment & Plan Note (Addendum)
Doing well. No changes indicated. Briefly discussed ADHD - declines meds for this.Karen [name]

## 2013-09-08 NOTE — Progress Notes (Signed)
   BP 134/80  Pulse 80  Temp(Src) 98.4 F (36.9 C) (Oral)  Wt 159 lb 12 oz (72.462 kg)   CC: 1 mo f/u  Subjective:    Patient ID: Karen [name], female    DOB: June 09, 1945, 68 y.o.   MRN: [id number]  HPI: Karen [name] is a 68 y.o. female presenting on 09/08/2013 for [place] presents today as a 1 mo f/u for her mood disorder. She has history of adjustment disorder along after her husband's passing along with anxiety/depression. ?ADHD component. Wonders about lorazepam causing memory troubles. Tolerating paxil 40mg  daily. Rare lorazepam use. She continues lorazepam 1mg  1/2 tab in am with extra dose if needed #60 per month - this is a decrease from prior.   Has been very busy today - to theater, downtown, shopping.  School ends Monday. Planning on having vacation this summer. 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  Wt Readings from Last 3 Encounters:  09/08/13 159 lb 12 oz (72.462 kg)  08/11/13 156 lb (70.761 kg)  07/12/13 156 lb 8 oz (70.988 kg)   Body mass index is 27.85 kg/(m^2).  Relevant past medical, surgical, family and social history reviewed and updated as indicated.  Allergies and medications reviewed and updated. Current Outpatient Prescriptions on File Prior to Visit  Medication Sig  . aspirin EC 81 MG tablet Take 81 mg by mouth every Monday, Wednesday, and Friday.  Marland Kitchen BIOTIN PO Take 1 tablet by mouth daily.  . Calcium Carb-Cholecalciferol (CALCIUM-VITAMIN D) 600-400 MG-UNIT TABS Take 1 tablet by mouth 2 (two) times daily.  . cetirizine (ZYRTEC) 10 MG tablet Take 10 mg by mouth daily.  . cholecalciferol (VITAMIN D) 1000 UNITS tablet Take 1,000 Units by mouth daily.  Marland Kitchen LORazepam (ATIVAN) 1 MG tablet TAKE 1/2 TABLET BY MOUTH EVERY 8HRS AS NEEDED FOR ANXIETY AND 1/2 TABLET ADDITIONAL IF NEEDED  . Multiple Vitamin (MULTIVITAMIN) tablet Take 1 tablet by mouth daily.  . NON FORMULARY Take 1 tablet by mouth daily. shaklee multivitamin  .  vitamin E 200 UNIT capsule Take 400 Units by mouth daily.   No current facility-administered medications on file prior to visit.    Review of Systems Per HPI unless specifically indicated above    Objective:    BP 134/80  Pulse 80  Temp(Src) 98.4 F (36.9 C) (Oral)  Wt 159 lb 12 oz (72.462 kg)  Physical Exam  Nursing note and vitals reviewed. Constitutional: She appears well-developed and well-nourished. No distress.  Psychiatric: She has a normal mood and affect. Her behavior is normal. Judgment and thought content normal.   Results for orders placed in visit on 12/29/12  HM COLONOSCOPY      Result Value Ref Range   HM Colonoscopy [name]        Assessment & Plan:   Problem List Items Addressed This Visit   Adjustment disorder with mixed anxiety and depressed mood - Primary     Doing well. No changes indicated. Briefly discussed ADHD - declines meds for this..        Follow up plan: Return in about 6 months (around 03/10/2014), or as needed, for follow up visit.

## 2013-09-08 NOTE — Patient Instructions (Signed)
I think you're doing well. Return as needed or in 6 weeks for follow up.

## 2013-09-08 NOTE — Progress Notes (Signed)
Pre visit review using our clinic review tool, if applicable. No additional management support is needed unless otherwise documented below in the visit note. 

## 2013-09-12 ENCOUNTER — Ambulatory Visit: Payer: Medicare PPO

## 2013-10-07 ENCOUNTER — Encounter: Payer: Self-pay | Admitting: Family Medicine

## 2013-10-07 ENCOUNTER — Ambulatory Visit (INDEPENDENT_AMBULATORY_CARE_PROVIDER_SITE_OTHER): Payer: Medicare PPO | Admitting: Family Medicine

## 2013-10-07 VITALS — BP 104/62 | HR 80 | Temp 98.2°F | Wt 157.5 lb

## 2013-10-07 DIAGNOSIS — M25561 Pain in right knee: Secondary | ICD-10-CM | POA: Insufficient documentation

## 2013-10-07 DIAGNOSIS — M25569 Pain in unspecified knee: Secondary | ICD-10-CM

## 2013-10-07 DIAGNOSIS — F4323 Adjustment disorder with mixed anxiety and depressed mood: Secondary | ICD-10-CM

## 2013-10-07 NOTE — Progress Notes (Signed)
BP 104/62  Pulse 80  Temp(Src) 98.2 F (36.8 C) (Oral)  Wt 157 lb 8 oz (71.442 kg)  SpO2 96%   CC: 1 mo f/u, discuss knee pain  Subjective:    Patient ID: Karen [name], female    DOB: 07-01-1945, 68 y.o.   MRN: [id number]  HPI: Karen [name] is a 68 y.o. female presenting on 10/07/2013 for Follow-up and Knee Pain   Planning trip to [facility] with family for long weekend.  [place] presents today as a 1 mo f/u for her mood disorder. She has history of adjustment disorder with mixed anxiety depression along with ?ADHD component worse after her husband's passing. Wonders about lorazepam causing memory troubles. Tolerating paxil 40mg  daily.   R anterior knee pain - comes and goes, worse with kneeling on floor. Feels spot moving. No swelling, redness or warmth. Denies inciting trauma or injury to R knee. Denies locking of instability of knee.  Relevant past medical, surgical, family and social history reviewed and updated as indicated.  Allergies and medications reviewed and updated. Current Outpatient Prescriptions on File Prior to Visit  Medication Sig  . aspirin EC 81 MG tablet Take 81 mg by mouth every Monday, Wednesday, and Friday.  Marland Kitchen BIOTIN PO Take 1 tablet by mouth daily.  . Calcium Carb-Cholecalciferol (CALCIUM-VITAMIN D) 600-400 MG-UNIT TABS Take 1 tablet by mouth 2 (two) times daily.  . cetirizine (ZYRTEC) 10 MG tablet Take 10 mg by mouth daily.  . cholecalciferol (VITAMIN D) 1000 UNITS tablet Take 1,000 Units by mouth daily.  Marland Kitchen LORazepam (ATIVAN) 1 MG tablet TAKE 1/2 TABLET BY MOUTH EVERY 8HRS AS NEEDED FOR ANXIETY AND 1/2 TABLET ADDITIONAL IF NEEDED  . Multiple Vitamin (MULTIVITAMIN) tablet Take 1 tablet by mouth daily.  . NON FORMULARY Take 1 tablet by mouth daily. shaklee multivitamin  . PARoxetine (PAXIL) 40 MG tablet TAKE 1 TABLET (40 MG TOTAL) BY MOUTH EVERY MORNING.  . vitamin E 200 UNIT capsule Take 400 Units by mouth daily.   No current facility-administered  medications on file prior to visit.    Review of Systems Per HPI unless specifically indicated above    Objective:    BP 104/62  Pulse 80  Temp(Src) 98.2 F (36.8 C) (Oral)  Wt 157 lb 8 oz (71.442 kg)  SpO2 96%  Physical Exam  Nursing note and vitals reviewed. Constitutional: She appears well-developed and well-nourished. No distress.  Musculoskeletal: She exhibits no edema.  L knee WNL R Knee exam: No deformity on inspection. No pain with palpation of knee landmarks. No effusion/swelling noted. FROM in flex/extension without crepitus. No popliteal fullness. Neg drawer test. Neg mcmurray test. No pain with valgus/varus stress. No PFgrind. No abnormal patellar mobility. Mild nodule overlying R lateral patella, nontedner  Psychiatric: She has a normal mood and affect.  talkative   Results for orders placed in visit on 12/29/12  HM COLONOSCOPY      Result Value Ref Range   HM Colonoscopy [name]        Assessment & Plan:   Problem List Items Addressed This Visit   Right knee pain     Unclear etiology. Not bothersome to patient. Will continue to monitor.    Adjustment disorder with mixed anxiety and depressed mood - Primary     Chronic issue, overall stable. Continue paxil 40mg  daily with lorazepam 1mg  1 tabl daily prn. No changes indicated. Has declined ADHD in past.        Follow up  plan: Return in about 5 weeks (around 11/11/2013), or as needed.

## 2013-10-07 NOTE — Assessment & Plan Note (Signed)
Chronic issue, overall stable. Continue paxil 40mg  daily with lorazepam 1mg  1 tabl daily prn. No changes indicated. Has declined ADHD in past.

## 2013-10-07 NOTE — Patient Instructions (Addendum)
You are doing well i'm not sure what is causing knee pain - let's watch and let me know if worsening. Knee exam overall ok today. Return to clinic in 4-6 weeks for follow up

## 2013-10-07 NOTE — Assessment & Plan Note (Signed)
Unclear etiology. Not bothersome to patient. Will continue to monitor.

## 2013-10-07 NOTE — Progress Notes (Signed)
Pre visit review using our clinic review tool, if applicable. No additional management support is needed unless otherwise documented below in the visit note. 

## 2013-10-09 ENCOUNTER — Other Ambulatory Visit: Payer: Self-pay | Admitting: Family Medicine

## 2013-10-10 NOTE — Telephone Encounter (Signed)
Rx called in as directed.   

## 2013-10-10 NOTE — Telephone Encounter (Signed)
Ok to refill 

## 2013-10-10 NOTE — Telephone Encounter (Signed)
plz phone in. 

## 2013-11-11 ENCOUNTER — Ambulatory Visit (INDEPENDENT_AMBULATORY_CARE_PROVIDER_SITE_OTHER): Payer: Medicare PPO | Admitting: Family Medicine

## 2013-11-11 ENCOUNTER — Encounter: Payer: Self-pay | Admitting: Family Medicine

## 2013-11-11 VITALS — BP 126/86 | HR 84 | Temp 98.4°F | Wt 159.2 lb

## 2013-11-11 DIAGNOSIS — F4323 Adjustment disorder with mixed anxiety and depressed mood: Secondary | ICD-10-CM

## 2013-11-11 NOTE — Progress Notes (Signed)
Pre visit review using our clinic review tool, if applicable. No additional management support is needed unless otherwise documented below in the visit note. 

## 2013-11-11 NOTE — Assessment & Plan Note (Signed)
Overall stable today. Continue med regimen.

## 2013-11-11 NOTE — Patient Instructions (Signed)
Good to see you today. You are doing well. Return to see me in 6-8 weeks for follow up.

## 2013-11-11 NOTE — Progress Notes (Signed)
   BP 126/86  Pulse 84  Temp(Src) 98.4 F (36.9 C) (Oral)  Wt 159 lb 4 oz (72.235 kg)   CC: 5 wk f/u  Subjective:    Patient ID: Karen [name], female    DOB: [birth year], 68 y.o.   MRN: [id number]  HPI: Karen [name] is a 68 y.o. female presenting on 11/11/2013 for [place] presents today as a 1 mo f/u for her mood disorder. She has history of adjustment disorder with mixed anxiety depression along with ?ADHD component worse after her husband's passing. Tolerating paxil 40mg  daily. Takes lorazepam 1/2 tab twice daily.   Did have a good time with family at [facility].  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   Relevant past medical, surgical, family and social history reviewed and updated as indicated.  Allergies and medications reviewed and updated. Current Outpatient Prescriptions on File Prior to Visit  Medication Sig  . aspirin EC 81 MG tablet Take 81 mg by mouth every Monday, Wednesday, and Friday.  Marland Kitchen BIOTIN PO Take 1 tablet by mouth daily.  . Calcium Carb-Cholecalciferol (CALCIUM-VITAMIN D) 600-400 MG-UNIT TABS Take 1 tablet by mouth 2 (two) times daily.  . cetirizine (ZYRTEC) 10 MG tablet Take 10 mg by mouth daily.  . cholecalciferol (VITAMIN D) 1000 UNITS tablet Take 1,000 Units by mouth daily.  Marland Kitchen LORazepam (ATIVAN) 1 MG tablet TAKE 1/2 TABLET BY MOUTH EVERY 8 HOURS AS NEEDED AND MAY TAKE AN ADDITIONAL 1/2 TABLET IF NEEDED  . Multiple Vitamin (MULTIVITAMIN) tablet Take 1 tablet by mouth daily.  . NON FORMULARY Take 1 tablet by mouth daily. shaklee multivitamin  . PARoxetine (PAXIL) 40 MG tablet TAKE 1 TABLET (40 MG TOTAL) BY MOUTH EVERY MORNING.  . vitamin E 200 UNIT capsule Take 400 Units by mouth daily.   No current facility-administered medications on file prior to visit.    Review of Systems Per HPI unless specifically indicated above    Objective:    BP 126/86  Pulse 84  Temp(Src) 98.4 F (36.9 C) (Oral)  Wt  159 lb 4 oz (72.235 kg)  Physical Exam  Nursing note and vitals reviewed. Constitutional: She appears well-developed and well-nourished. No distress.  Psychiatric: She has a normal mood and affect. Her behavior is normal.       Assessment & Plan:   Problem List Items Addressed This Visit   Adjustment disorder with mixed anxiety and depressed mood - Primary     Overall stable today. Continue med regimen.        Follow up plan: Return in about 6 weeks (around 12/23/2013), or as needed, for physical.

## 2013-11-21 ENCOUNTER — Encounter: Payer: Self-pay | Admitting: *Deleted

## 2013-11-21 ENCOUNTER — Ambulatory Visit
Admission: RE | Admit: 2013-11-21 | Discharge: 2013-11-21 | Disposition: A | Discharge status: Home / Self Care | Payer: Medicare PPO | Source: Ambulatory Visit

## 2013-11-21 DIAGNOSIS — Z1231 Encounter for screening mammogram for malignant neoplasm of breast: Secondary | ICD-10-CM

## 2013-11-21 LAB — HM MAMMOGRAPHY: HM Mammogram: NORMAL

## 2013-12-08 ENCOUNTER — Other Ambulatory Visit: Payer: Self-pay | Admitting: Family Medicine

## 2013-12-08 NOTE — Telephone Encounter (Signed)
Ok to refill 

## 2013-12-08 NOTE — Telephone Encounter (Signed)
Rx called in as directed.   

## 2013-12-08 NOTE — Telephone Encounter (Signed)
plz phone in. 

## 2013-12-11 ENCOUNTER — Other Ambulatory Visit: Payer: Self-pay | Admitting: Family Medicine

## 2013-12-11 DIAGNOSIS — N183 Chronic kidney disease, stage 3 unspecified: Secondary | ICD-10-CM | POA: Insufficient documentation

## 2013-12-11 DIAGNOSIS — N289 Disorder of kidney and ureter, unspecified: Secondary | ICD-10-CM

## 2013-12-11 DIAGNOSIS — M8580 Other specified disorders of bone density and structure, unspecified site: Secondary | ICD-10-CM

## 2013-12-11 HISTORY — DX: Chronic kidney disease, stage 3 unspecified: N18.30

## 2013-12-13 ENCOUNTER — Encounter: Payer: Self-pay | Admitting: Radiology

## 2013-12-13 ENCOUNTER — Other Ambulatory Visit (INDEPENDENT_AMBULATORY_CARE_PROVIDER_SITE_OTHER): Payer: Medicare PPO

## 2013-12-13 DIAGNOSIS — N289 Disorder of kidney and ureter, unspecified: Secondary | ICD-10-CM

## 2013-12-13 DIAGNOSIS — E785 Hyperlipidemia, unspecified: Secondary | ICD-10-CM

## 2013-12-13 LAB — RENAL FUNCTION PANEL
Albumin: 3.6 g/dL (ref 3.5–5.2)
BUN: 16 mg/dL (ref 6–23)
CO2: 26 mEq/L (ref 19–32)
Calcium: 8.4 mg/dL (ref 8.4–10.5)
Chloride: 109 mEq/L (ref 96–112)
Creatinine, Ser: 1.2 mg/dL (ref 0.4–1.2)
GFR: 49.27 mL/min — ABNORMAL LOW (ref >60.00)
Glucose, Bld: 112 mg/dL — ABNORMAL HIGH (ref 70–99)
Phosphorus: 3 mg/dL (ref 2.3–4.6)
Potassium: 3.6 mEq/L (ref 3.5–5.1)
Sodium: 142 mEq/L (ref 135–145)

## 2013-12-13 LAB — TSH: TSH: 4.12 u[IU]/mL (ref 0.35–4.50)

## 2013-12-13 LAB — LIPID PANEL
Cholesterol: 211 mg/dL — ABNORMAL HIGH (ref 0–200)
HDL: 49.5 mg/dL (ref >39.00)
LDL Cholesterol: 147 mg/dL — ABNORMAL HIGH (ref 0–99)
NonHDL: 161.5
Total CHOL/HDL Ratio: 4
Triglycerides: 71 mg/dL (ref 0.0–149.0)
VLDL: 14.2 mg/dL (ref 0.0–40.0)

## 2013-12-14 LAB — MICROALBUMIN / CREATININE URINE RATIO
Creatinine,U: 203.7 mg/dL
Microalb Creat Ratio: 1.4 mg/g (ref 0.0–30.0)
Microalb, Ur: 2.8 mg/dL — ABNORMAL HIGH (ref 0.0–1.9)

## 2013-12-20 ENCOUNTER — Encounter: Payer: Medicare PPO | Admitting: Family Medicine

## 2013-12-29 ENCOUNTER — Ambulatory Visit (INDEPENDENT_AMBULATORY_CARE_PROVIDER_SITE_OTHER): Payer: Medicare PPO | Admitting: Family Medicine

## 2013-12-29 ENCOUNTER — Encounter: Payer: Self-pay | Admitting: Family Medicine

## 2013-12-29 VITALS — BP 118/70 | HR 79 | Temp 98.3°F | Ht 63.0 in | Wt 162.2 lb

## 2013-12-29 DIAGNOSIS — M8580 Other specified disorders of bone density and structure, unspecified site: Secondary | ICD-10-CM

## 2013-12-29 DIAGNOSIS — N289 Disorder of kidney and ureter, unspecified: Secondary | ICD-10-CM

## 2013-12-29 DIAGNOSIS — Z23 Encounter for immunization: Secondary | ICD-10-CM

## 2013-12-29 DIAGNOSIS — E785 Hyperlipidemia, unspecified: Secondary | ICD-10-CM

## 2013-12-29 DIAGNOSIS — Z0000 Encounter for general adult medical examination without abnormal findings: Secondary | ICD-10-CM

## 2013-12-29 DIAGNOSIS — F4323 Adjustment disorder with mixed anxiety and depressed mood: Secondary | ICD-10-CM

## 2013-12-29 NOTE — Patient Instructions (Signed)
Flu shot and prevnar (pneumonia) shot today. Good to see you today, call us with questions. Return to see [place] in 2-3 months for follow up Watch pepsi, other sweetened beverages and added sugars as your sugar level was elevated. Continue lots of water and avoid NSAIDs like ibuprofen and aleve

## 2013-12-29 NOTE — Progress Notes (Signed)
BP 118/70  Pulse 79  Temp(Src) 98.3 F (36.8 C) (Oral)  Ht 5\' 3"  (1.6 m)  Wt 162 lb 4 oz (73.596 kg)  BMI 28.75 kg/m2  SpO2 96%   CC: medicare wellness visit  Subjective:    Patient ID: Karen [name], female    DOB: 1946/01/26, 68 y.o.   MRN: [id number]  HPI: Karen [name] is a 68 y.o. female presenting on 12/29/2013 for Annual Exam   Hearing screen passed Vision screen at eye doctor Denies depression/sadness 2 falls in last year - mechanical, tripped. No dizziness or premonitory sxs.  Preventative: Last mammogram was 10/2013 WNL  Well woman - s/p hysterectomy for benign reasons.  COLONOSCOPY Date: 01/01/2005 severe melanosis coli, int hem, some residual stool ([name]) Flu - today  Td 1999. May have had another during her accident but unsure.  Zostavax 2012  Pneumovax - 2014. prevnar today  Osteopenia Date: 11/2012 T score -2.4 AP spine  Advanced directives: thinks she has at home.   Caffeine: lots of pepsi  Lives alone - children live nearby.  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  Allergies and medications reviewed and updated. Current Outpatient Prescriptions on File Prior to Visit  Medication Sig  . aspirin EC 81 MG tablet Take 81 mg by mouth every Monday, Wednesday, and Friday.  Marland Kitchen BIOTIN PO Take 1 tablet by mouth daily.  . Calcium Carb-Cholecalciferol (CALCIUM-VITAMIN D) 600-400 MG-UNIT TABS Take 1 tablet by mouth 2 (two) times daily.  . cetirizine (ZYRTEC) 10 MG tablet Take 10 mg by mouth daily.  . cholecalciferol (VITAMIN D) 1000 UNITS tablet Take 1,000 Units by mouth daily.  Marland Kitchen LORazepam (ATIVAN) 1 MG tablet TAKE 1/2 TABLET BY MOUTH EVERY 8 HOURS AS NEEDED AND MAY TAKE AN ADDITIONAL 1/2 TABLET IF NEEDED  . Multiple Vitamin (MULTIVITAMIN) tablet Take 1 tablet by mouth daily.  . NON FORMULARY Take 1  tablet by mouth daily. [place] multivitamin  . PARoxetine (PAXIL) 40 MG tablet TAKE 1 TABLET (40 MG TOTAL) BY MOUTH EVERY MORNING.  . vitamin E 200 UNIT capsule Take 400 Units by mouth daily.   No current facility-administered medications on file prior to visit.    Review of Systems Per HPI unless specifically indicated above    Objective:    BP 118/70  Pulse 79  Temp(Src) 98.3 F (36.8 C) (Oral)  Ht 5\' 3"  (1.6 m)  Wt 162 lb 4 oz (73.596 kg)  BMI 28.75 kg/m2  SpO2 96%  Physical Exam  Nursing note and vitals reviewed. Constitutional: She is oriented to person, place, and time. She appears well-developed and well-nourished. No distress.  HENT:  Head: Normocephalic and atraumatic.  Right Ear: Hearing, tympanic membrane, external ear and ear canal normal.  Left Ear: Hearing, tympanic membrane, external ear and ear canal normal.  Nose: Nose normal.  Mouth/Throat: Uvula is midline, oropharynx is clear and moist and mucous membranes are normal. No oropharyngeal exudate, posterior oropharyngeal edema or posterior oropharyngeal erythema.  Eyes: Conjunctivae and EOM are normal. Pupils are equal, round, and reactive to light. No scleral icterus.  Neck: Normal range of motion. Neck supple. Carotid bruit is not present. No thyromegaly present.  Cardiovascular: Normal rate, regular rhythm, normal heart sounds and intact distal pulses.   No murmur heard. Pulses:      Radial pulses are 2+  on the right side, and 2+ on the left side.  Pulmonary/Chest: Effort normal and breath sounds normal. No respiratory distress. She has no wheezes. She has no rales.  Abdominal: Soft. Bowel sounds are normal. She exhibits no distension and no mass. There is no tenderness. There is no rebound and no guarding.  Musculoskeletal: Normal range of motion. She exhibits no edema.  Lymphadenopathy:    She has no cervical adenopathy.  Neurological: She is alert and oriented to person, place, and time.  CN grossly  intact, station and gait intact Recall 2/3, 3/3 with cue Calculation 4/5 serial 3s, trouble with serial 7s  Skin: Skin is warm and dry. No rash noted.  Psychiatric: She has a normal mood and affect. Her behavior is normal. Judgment and thought content normal.   Results for orders placed in visit on 12/13/13  LIPID PANEL      Result Value Ref Range   Cholesterol 211 (*) 0 - 200 mg/dL   Triglycerides 71.0  0.0 - 149.0 mg/dL   HDL 49.50  >39.00 mg/dL   VLDL 14.2  0.0 - 40.0 mg/dL   LDL Cholesterol 147 (*) 0 - 99 mg/dL   Total CHOL/HDL Ratio 4     NonHDL 161.50    RENAL FUNCTION PANEL      Result Value Ref Range   Sodium 142  135 - 145 mEq/L   Potassium 3.6  3.5 - 5.1 mEq/L   Chloride 109  96 - 112 mEq/L   CO2 26  19 - 32 mEq/L   Calcium 8.4  8.4 - 10.5 mg/dL   Albumin 3.6  3.5 - 5.2 g/dL   BUN 16  6 - 23 mg/dL   Creatinine, Ser 1.2  0.4 - 1.2 mg/dL   Glucose, Bld 112 (*) 70 - 99 mg/dL   Phosphorus 3.0  2.3 - 4.6 mg/dL   GFR 49.27 (*) >60.00 mL/min  MICROALBUMIN / CREATININE URINE RATIO      Result Value Ref Range   Microalb, Ur 2.8 (*) 0.0 - 1.9 mg/dL   Creatinine,U 203.7     Microalb Creat Ratio 1.4  0.0 - 30.0 mg/g  TSH      Result Value Ref Range   TSH 4.12  0.35 - 4.50 uIU/mL      Assessment & Plan:   Problem List Items Addressed This Visit   Osteopenia     Reviewed continued cal/vit D supplementation.    Medicare annual wellness visit, subsequent     I have personally reviewed the Medicare Annual Wellness questionnaire and have noted 1. The patient's medical and social history 2. Their use of alcohol, tobacco or illicit drugs 3. Their current medications and supplements 4. The patient's functional ability including ADL's, fall risks, home safety risks and hearing or visual impairment. 5. Diet and physical activity 6. Evidence for depression or mood disorders The patients weight, height, BMI have been recorded in the chart.  Hearing and vision has been  addressed. I have made referrals, counseling and provided education to the patient based review of the above and I have provided the pt with a written personalized care plan for preventive services. Provider list updated - see scanned questionairre.  Reviewed preventative protocols and updated unless pt declined.    HLD (hyperlipidemia)     Mild, off meds. Reviewed diet and lifestyle changes to improve #s.    CKD (chronic kidney disease) stage 3, GFR 30-59 ml/min     Reviewed again with patient -  seems to be in stage 3 CKD. Continue to monitor.    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        Follow up plan: Return in about 3 months (around 03/31/2014), or as needed, for follow up.

## 2013-12-29 NOTE — Assessment & Plan Note (Signed)
Stable on prozac/lorazepam.

## 2013-12-29 NOTE — Assessment & Plan Note (Signed)
Mild, off meds. Reviewed diet and lifestyle changes to improve #s.

## 2013-12-29 NOTE — Assessment & Plan Note (Signed)
Reviewed continued cal/vit D supplementation.

## 2013-12-29 NOTE — Addendum Note (Signed)
Addended by: Tammi Sou on: 12/29/2013 12:57 PM   Modules accepted: Orders

## 2013-12-29 NOTE — Assessment & Plan Note (Signed)

## 2013-12-29 NOTE — Assessment & Plan Note (Signed)
Reviewed again with patient - seems to be in stage 3 CKD. Continue to monitor.

## 2013-12-29 NOTE — Progress Notes (Signed)
Pre visit review using our clinic review tool, if applicable. No additional management support is needed unless otherwise documented below in the visit note. 

## 2013-12-30 ENCOUNTER — Encounter: Payer: Self-pay | Admitting: Family Medicine

## 2014-01-06 ENCOUNTER — Ambulatory Visit (INDEPENDENT_AMBULATORY_CARE_PROVIDER_SITE_OTHER): Payer: Medicare PPO | Admitting: Family Medicine

## 2014-01-06 ENCOUNTER — Encounter: Payer: Self-pay | Admitting: Family Medicine

## 2014-01-06 VITALS — BP 114/82 | HR 84 | Temp 99.1°F | Wt 163.0 lb

## 2014-01-06 DIAGNOSIS — D492 Neoplasm of unspecified behavior of bone, soft tissue, and skin: Secondary | ICD-10-CM

## 2014-01-06 DIAGNOSIS — L918 Other hypertrophic disorders of the skin: Secondary | ICD-10-CM

## 2014-01-06 NOTE — Progress Notes (Signed)
Pre visit review using our clinic review tool, if applicable. No additional management support is needed unless otherwise documented below in the visit note. 

## 2014-01-06 NOTE — Patient Instructions (Signed)
Change bandaid daily. let us know if spreading redness or other concerns.

## 2014-01-06 NOTE — Progress Notes (Signed)
   BP 114/82  Pulse 84  Temp(Src) 99.1 F (37.3 C) (Oral)  Wt 163 lb (73.936 kg)   CC: L thigh lesion Subjective:    Patient ID: Karen [name], female    DOB: 12/05/1945, 69 y.o.   MRN: [id number]  HPI: Karen [name] is a 68 y.o. female presenting on 01/06/2014 for Follow-up   L thigh lesion present over last 1+ year. Anterior thigh, not tender or itchy. Has bled some. Easily irritated.  Also would like skin tags left neck and midline lower neck removed. Get stuck on clothes/necklace. Pt has tried to tie off anterior lesion unsuccessfully.  Relevant past medical, surgical, family and social history reviewed and updated as indicated.  Allergies and medications reviewed and updated. Current Outpatient Prescriptions on File Prior to Visit  Medication Sig  . BIOTIN PO Take 1 tablet by mouth daily.  . Calcium Carb-Cholecalciferol (CALCIUM-VITAMIN D) 600-400 MG-UNIT TABS Take 1 tablet by mouth 2 (two) times daily.  . cetirizine (ZYRTEC) 10 MG tablet Take 10 mg by mouth daily.  . cholecalciferol (VITAMIN D) 1000 UNITS tablet Take 1,000 Units by mouth daily.  Marland Kitchen LORazepam (ATIVAN) 1 MG tablet TAKE 1/2 TABLET BY MOUTH EVERY 8 HOURS AS NEEDED AND MAY TAKE AN ADDITIONAL 1/2 TABLET IF NEEDED  . Multiple Vitamin (MULTIVITAMIN) tablet Take 1 tablet by mouth daily.  Marland Kitchen PARoxetine (PAXIL) 40 MG tablet TAKE 1 TABLET (40 MG TOTAL) BY MOUTH EVERY MORNING.  . vitamin E 200 UNIT capsule Take 400 Units by mouth daily.  Marland Kitchen aspirin EC 81 MG tablet Take 81 mg by mouth every Monday, Wednesday, and Friday.   No current facility-administered medications on file prior to visit.    Review of Systems Per HPI unless specifically indicated above    Objective:    BP 114/82  Pulse 84  Temp(Src) 99.1 F (37.3 C) (Oral)  Wt 163 lb (73.936 kg)  Physical Exam  Nursing note and vitals reviewed. Constitutional: She appears well-developed and well-nourished. No distress.  Skin: Skin is warm and dry.  Skin tag  L neck and anterior neck on right. L anterior thigh with hardened growth, 26mm in diameter.   Skin tag removal and leg shave biopsy: IC obtained and in chart. Skin cleaned with alcohol pads. Anesthesia achieved with <0.5cc 1% lidocaine with epinephrine. Skin tags snipped off with iris scissors, dressed with abx ointment and bandage. Left thigh lesion easily removed with shave blade, hemostasis achieved with silver nitrate, wound dressed with triple abx ointment and bandage. Pt tolerated well. Aftercare instructions discussed.     Assessment & Plan:   Problem List Items Addressed This Visit   None    Visit Diagnoses   Abnormal skin growth    -  Primary    Relevant Orders       Dermatology pathology    Skin tag         Skin growth sent to pathology. Skin tags not sent to pathology Follow up plan: No Follow-up on file.

## 2014-01-11 ENCOUNTER — Encounter: Payer: Self-pay | Admitting: *Deleted

## 2014-02-14 ENCOUNTER — Other Ambulatory Visit: Payer: Self-pay | Admitting: Family Medicine

## 2014-02-14 NOTE — Telephone Encounter (Signed)
plz phone in. 

## 2014-02-14 NOTE — Telephone Encounter (Signed)
Ok to refill 

## 2014-02-15 NOTE — Telephone Encounter (Signed)
Rx called in as directed.   

## 2014-03-03 ENCOUNTER — Encounter: Payer: Self-pay | Admitting: Family Medicine

## 2014-03-03 ENCOUNTER — Ambulatory Visit (INDEPENDENT_AMBULATORY_CARE_PROVIDER_SITE_OTHER): Payer: Medicare PPO | Admitting: Family Medicine

## 2014-03-03 VITALS — BP 124/68 | HR 64 | Temp 98.2°F | Wt 162.2 lb

## 2014-03-03 DIAGNOSIS — N183 Chronic kidney disease, stage 3 unspecified: Secondary | ICD-10-CM

## 2014-03-03 DIAGNOSIS — F4323 Adjustment disorder with mixed anxiety and depressed mood: Secondary | ICD-10-CM

## 2014-03-03 NOTE — Patient Instructions (Addendum)
Blood work today - check kidneys, liver and blood counts. We will call you with results and possibly schedule kidney ultrasound. Good to see you today. Return as needed or in 2-3 months for follow up visit.

## 2014-03-03 NOTE — Addendum Note (Signed)
Addended by: Ellamae Sia on: 03/03/2014 04:23 PM   Modules accepted: Orders

## 2014-03-03 NOTE — Assessment & Plan Note (Signed)
Persistently elevated Cr with GFR 40s. Recheck CMP, check CBC today. If staying elevated will obtain renal US. Pt agrees with plan. Aware to avoid NSAIDs and stay well hydrated.

## 2014-03-03 NOTE — Progress Notes (Signed)
Pre visit review using our clinic review tool, if applicable. No additional management support is needed unless otherwise documented below in the visit note. 

## 2014-03-03 NOTE — Assessment & Plan Note (Signed)
Chronic, stable. Continue regimen. Seen here Q2-3 mo.

## 2014-03-03 NOTE — Progress Notes (Signed)
   BP 124/68 mmHg  Pulse 64  Temp(Src) 98.2 F (36.8 C) (Oral)  Wt 162 lb 4 oz (73.596 kg)   CC: 2 mo f/u visit  Subjective:    Patient ID: Karen [name], female    DOB: Jul 04, 1945, 68 y.o.   MRN: [id number]  HPI: Karen [name] is a 68 y.o. female presenting on 03/03/2014 for Follow-up   Mood disorder - stable on paxil 40mg  daily along with ativan prn. Continues substituting.   CKD stage 3 - over last 2 years, worse since trauma.  Relevant past medical, surgical, family and social history reviewed and updated as indicated. Interim medical history since our last visit reviewed. Allergies and medications reviewed and updated.  Current Outpatient Prescriptions on File Prior to Visit  Medication Sig  . aspirin EC 81 MG tablet Take 81 mg by mouth every Monday, Wednesday, and Friday.  Marland Kitchen BIOTIN PO Take 1 tablet by mouth daily.  . Calcium Carb-Cholecalciferol (CALCIUM-VITAMIN D) 600-400 MG-UNIT TABS Take 1 tablet by mouth 2 (two) times daily.  . cetirizine (ZYRTEC) 10 MG tablet Take 10 mg by mouth daily.  . cholecalciferol (VITAMIN D) 1000 UNITS tablet Take 1,000 Units by mouth daily.  Marland Kitchen LORazepam (ATIVAN) 1 MG tablet TAKE 1/2 TABLET BY MOUTH EVERY 8HRS AS NEEDED **MAY TAKE 1/2 TAB IF NEEDED**  . Multiple Vitamin (MULTIVITAMIN) tablet Take 1 tablet by mouth daily.  Marland Kitchen PARoxetine (PAXIL) 40 MG tablet TAKE 1 TABLET (40 MG TOTAL) BY MOUTH EVERY MORNING.  . vitamin E 200 UNIT capsule Take 400 Units by mouth daily.   No current facility-administered medications on file prior to visit.    Review of Systems Per HPI unless specifically indicated above     Objective:    BP 124/68 mmHg  Pulse 64  Temp(Src) 98.2 F (36.8 C) (Oral)  Wt 162 lb 4 oz (73.596 kg)  Wt Readings from Last 3 Encounters:  03/03/14 162 lb 4 oz (73.596 kg)  01/06/14 163 lb (73.936 kg)  12/29/13 162 lb 4 oz (73.596 kg)    Physical Exam  Constitutional: She appears well-developed and well-nourished. No distress.   HENT:  Mouth/Throat: Oropharynx is clear and moist. No oropharyngeal exudate.  Cardiovascular: Normal rate, regular rhythm, normal heart sounds and intact distal pulses.   No murmur heard. Pulmonary/Chest: Effort normal and breath sounds normal. No respiratory distress. She has no wheezes. She has no rales.  Musculoskeletal: She exhibits no edema.  Psychiatric: She has a normal mood and affect.  Nursing note and vitals reviewed.      Assessment & Plan:   Problem List Items Addressed This Visit    CKD (chronic kidney disease) stage 3, GFR 30-59 ml/min - Primary    Persistently elevated Cr with GFR 40s. Recheck CMP, check CBC today. If staying elevated will obtain renal US. Pt agrees with plan. Aware to avoid NSAIDs and stay well hydrated.    Relevant Orders      Comprehensive metabolic panel      CBC with Differential   Adjustment disorder with mixed anxiety and depressed mood    Chronic, stable. Continue regimen. Seen here Q2-3 mo.        Follow up plan: Return in about 2 months (around 05/04/2014), or if symptoms worsen or fail to improve, for follow up visit.

## 2014-03-04 LAB — CBC WITH DIFFERENTIAL/PLATELET
Basophils Absolute: 0.1 10*3/uL (ref 0.0–0.1)
Basophils Relative: 1 % (ref 0–1)
Eosinophils Absolute: 0.1 10*3/uL (ref 0.0–0.7)
Eosinophils Relative: 1 % (ref 0–5)
HCT: 40.6 % (ref 36.0–46.0)
Hemoglobin: 14.2 g/dL (ref 12.0–15.0)
Lymphocytes Relative: 30 % (ref 12–46)
Lymphs Abs: 2.1 10*3/uL (ref 0.7–4.0)
MCH: 29.5 pg (ref 26.0–34.0)
MCHC: 35 g/dL (ref 30.0–36.0)
MCV: 84.4 fL (ref 78.0–100.0)
MPV: 12.1 fL (ref 9.4–12.4)
Monocytes Absolute: 0.6 10*3/uL (ref 0.1–1.0)
Monocytes Relative: 8 % (ref 3–12)
Neutro Abs: 4.2 10*3/uL (ref 1.7–7.7)
Neutrophils Relative %: 60 % (ref 43–77)
Platelets: 182 10*3/uL (ref 150–400)
RBC: 4.81 MIL/uL (ref 3.87–5.11)
RDW: 13.6 % (ref 11.5–15.5)
WBC: 7 10*3/uL (ref 4.0–10.5)

## 2014-03-04 LAB — COMPREHENSIVE METABOLIC PANEL
ALT: 20 U/L (ref 0–35)
AST: 23 U/L (ref 0–37)
Albumin: 4 g/dL (ref 3.5–5.2)
Alkaline Phosphatase: 71 U/L (ref 39–117)
BUN: 18 mg/dL (ref 6–23)
CO2: 25 mEq/L (ref 19–32)
Calcium: 8.9 mg/dL (ref 8.4–10.5)
Chloride: 106 mEq/L (ref 96–112)
Creat: 1.09 mg/dL (ref 0.50–1.10)
Glucose, Bld: 88 mg/dL (ref 70–99)
Potassium: 4.1 mEq/L (ref 3.5–5.3)
Sodium: 140 mEq/L (ref 135–145)
Total Bilirubin: 0.5 mg/dL (ref 0.2–1.2)
Total Protein: 6.2 g/dL (ref 6.0–8.3)

## 2014-03-06 ENCOUNTER — Other Ambulatory Visit: Payer: Self-pay | Admitting: Family Medicine

## 2014-03-06 DIAGNOSIS — N183 Chronic kidney disease, stage 3 unspecified: Secondary | ICD-10-CM

## 2014-03-15 ENCOUNTER — Ambulatory Visit: Payer: Self-pay | Admitting: Family Medicine

## 2014-03-16 ENCOUNTER — Encounter: Payer: Self-pay | Admitting: Family Medicine

## 2014-03-17 ENCOUNTER — Telehealth: Payer: Self-pay | Admitting: Family Medicine

## 2014-03-17 NOTE — Telephone Encounter (Signed)
.  left message to have patient return my call. Pt ultrasound was normal.

## 2014-03-17 NOTE — Telephone Encounter (Signed)
Ms Karen is calling to get the results of her [place]

## 2014-03-20 NOTE — Telephone Encounter (Signed)
Message left advising patient.  

## 2014-04-04 ENCOUNTER — Telehealth: Payer: Self-pay

## 2014-04-04 NOTE — Telephone Encounter (Signed)
plz clarify - pt should be on 40mg  paxil daily.

## 2014-04-04 NOTE — Telephone Encounter (Signed)
Anna at [facility] notified.

## 2014-04-04 NOTE — Telephone Encounter (Signed)
Karen [name] with [facility] left v/m requesting clarification for paroxetine 40 mg written in march 2015; directions are take 1 tab (30 mg ) daily. This is on historical med list for pt.Please advise.

## 2014-04-20 ENCOUNTER — Other Ambulatory Visit: Payer: Self-pay | Admitting: Family Medicine

## 2014-04-20 NOTE — Telephone Encounter (Signed)
Plz phone in

## 2014-04-20 NOTE — Telephone Encounter (Signed)
Rx called in as directed.   

## 2014-05-04 ENCOUNTER — Ambulatory Visit (INDEPENDENT_AMBULATORY_CARE_PROVIDER_SITE_OTHER): Payer: Medicare PPO | Admitting: Family Medicine

## 2014-05-04 ENCOUNTER — Encounter: Payer: Self-pay | Admitting: Family Medicine

## 2014-05-04 ENCOUNTER — Telehealth: Payer: Self-pay | Admitting: Family Medicine

## 2014-05-04 VITALS — BP 128/78 | HR 64 | Temp 98.3°F | Wt 166.2 lb

## 2014-05-04 DIAGNOSIS — N183 Chronic kidney disease, stage 3 unspecified: Secondary | ICD-10-CM

## 2014-05-04 DIAGNOSIS — F4323 Adjustment disorder with mixed anxiety and depressed mood: Secondary | ICD-10-CM

## 2014-05-04 NOTE — Assessment & Plan Note (Signed)
Actually improved on last check - continue close monitoring with Q6 mo labwork.

## 2014-05-04 NOTE — Progress Notes (Signed)
BP 128/78 mmHg  Pulse 64  Temp(Src) 98.3 F (36.8 C) (Oral)  Wt 166 lb 4 oz (75.411 kg)   CC: 2 mo f/u visit  Subjective:    Patient ID: Karen [name], female    DOB: 01-May-1945, 69 y.o.   MRN: [id number]  HPI: Karen [name] is a 69 y.o. 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   Mood disorder - stable on paroxetine 64m daily along with ativan prn. More trouble with mood since Christmas. Doesn't think this was holiday related.   CKD stage 3 - over last 2 years, worse since trauma.   Gym regularly - walks 2 miles twice daily. Trying this for weight bearing exercises.   Relevant past medical, surgical, family and social history reviewed and updated as indicated. Interim medical history since our last visit reviewed. Allergies and medications reviewed and updated. Current Outpatient Prescriptions on File Prior to Visit  Medication Sig  . aspirin EC 81 MG tablet Take 81 mg by mouth every Monday, Wednesday, and Friday.  .Marland KitchenBIOTIN PO Take 1 tablet by mouth daily.  . Calcium Carb-Cholecalciferol (CALCIUM-VITAMIN D) 600-400 MG-UNIT TABS Take 1 tablet by mouth 2 (two) times daily.  . cetirizine (ZYRTEC) 10 MG tablet Take 10 mg by mouth daily.  . cholecalciferol (VITAMIN D) 1000 UNITS tablet Take 1,000 Units by mouth daily.  .Marland KitchenLORazepam (ATIVAN) 1 MG tablet TAKE ONE-HALF TABLET BY MOUTH EVERY 8 HOURS AS NEEDED  . Multiple Vitamin (MULTIVITAMIN) tablet Take 1 tablet by mouth daily.  .Marland KitchenPARoxetine (PAXIL) 40 MG tablet TAKE 1 TABLET (40 MG TOTAL) BY MOUTH EVERY MORNING.  . vitamin E 200 UNIT capsule Take 400 Units by mouth daily.   No current facility-administered medications on file prior to visit.    Review of Systems Per HPI unless specifically indicated above     Objective:    BP 128/78 mmHg  Pulse 64  Temp(Src) 98.3 F (36.8 C) (Oral)  Wt 166 lb 4 oz (75.411 kg)  Wt Readings from Last 3 Encounters:   05/04/14 166 lb 4 oz (75.411 kg)  03/03/14 162 lb 4 oz (73.596 kg)  01/06/14 163 lb (73.936 kg)    Physical Exam  Constitutional: She appears well-developed and well-nourished. No distress.  HENT:  Mouth/Throat: Oropharynx is clear and moist. No oropharyngeal exudate.  Cardiovascular: Normal rate, regular rhythm, normal heart sounds and intact distal pulses.   No murmur heard. Pulmonary/Chest: Effort normal and breath sounds normal. No respiratory distress. She has no wheezes. She has no rales.  Musculoskeletal: She exhibits no edema.  Psychiatric: She has a normal mood and affect.  Nursing note and vitals reviewed.  Results for orders placed or performed in visit on 03/03/14  Comprehensive metabolic panel  Result Value Ref Range   Sodium 140 135 - 145 mEq/L   Potassium 4.1 3.5 - 5.3 mEq/L   Chloride 106 96 - 112 mEq/L   CO2 25 19 - 32 mEq/L   Glucose, Bld 88 70 - 99 mg/dL   BUN 18 6 - 23 mg/dL   Creat 1.09 0.50 - 1.10 mg/dL   Total Bilirubin 0.5 0.2 - 1.2 mg/dL   Alkaline Phosphatase 71 39 - 117 U/L   AST 23 0 - 37 U/L   ALT 20 0 - 35 U/L   Total Protein 6.2 6.0 - 8.3 g/dL   Albumin 4.0 3.5 - 5.2 g/dL   Calcium 8.9 8.4 -  10.5 mg/dL  CBC with Differential  Result Value Ref Range   WBC 7.0 4.0 - 10.5 K/uL   RBC 4.81 3.87 - 5.11 MIL/uL   Hemoglobin 14.2 12.0 - 15.0 g/dL   HCT 40.6 36.0 - 46.0 %   MCV 84.4 78.0 - 100.0 fL   MCH 29.5 26.0 - 34.0 pg   MCHC 35.0 30.0 - 36.0 g/dL   RDW 13.6 11.5 - 15.5 %   Platelets 182 150 - 400 K/uL   MPV 12.1 9.4 - 12.4 fL   Neutrophils Relative % 60 43 - 77 %   Neutro Abs 4.2 1.7 - 7.7 K/uL   Lymphocytes Relative 30 12 - 46 %   Lymphs Abs 2.1 0.7 - 4.0 K/uL   Monocytes Relative 8 3 - 12 %   Monocytes Absolute 0.6 0.1 - 1.0 K/uL   Eosinophils Relative 1 0 - 5 %   Eosinophils Absolute 0.1 0.0 - 0.7 K/uL   Basophils Relative 1 0 - 1 %   Basophils Absolute 0.1 0.0 - 0.1 K/uL   Smear Review Criteria for review not met         Assessment & Plan:   Problem List Items Addressed This Visit    CKD (chronic kidney disease) stage 3, GFR 30-59 ml/min    Actually improved on last check - continue close monitoring with Q6 mo labwork.      Adjustment disorder with mixed anxiety and depressed mood - Primary    Chronic, stable. Continue paxil 57m daily along with ativan prn. RTC 2 mo f/u visit - pt does better with frequent visits.          Follow up plan: Return in about 2 months (around 07/03/2014), or as needed.

## 2014-05-04 NOTE — Telephone Encounter (Signed)
Karen [name] forgot to mention at her visit this morning that she has been having a some "dizziness in her head sometimes". She thinks that maybe some of her meds could be causing this, but she wanted Dr Darnell Level to know.

## 2014-05-04 NOTE — Progress Notes (Signed)
Pre visit review using our clinic review tool, if applicable. No additional management support is needed unless otherwise documented below in the visit note. 

## 2014-05-04 NOTE — Telephone Encounter (Signed)
Patient notified

## 2014-05-04 NOTE — Patient Instructions (Signed)
You are doing well today. Return as needed or in 2 months for follow up. Continue medicines as upt to now.

## 2014-05-04 NOTE — Assessment & Plan Note (Addendum)
Chronic, stable. Continue paxil 40mg  daily along with ativan prn. RTC 2 mo f/u visit - pt does better with frequent visits.

## 2014-05-04 NOTE — Telephone Encounter (Signed)
Noted. bp was looking good. Monitor this, watch for triggers, and ensure staying well hydrated

## 2014-05-28 ENCOUNTER — Other Ambulatory Visit: Payer: Self-pay | Admitting: Family Medicine

## 2014-06-12 DIAGNOSIS — H04122 Dry eye syndrome of left lacrimal gland: Secondary | ICD-10-CM | POA: Diagnosis not present

## 2014-06-19 ENCOUNTER — Other Ambulatory Visit: Payer: Self-pay | Admitting: Family Medicine

## 2014-06-19 NOTE — Telephone Encounter (Signed)
Rx called in as directed.   

## 2014-06-19 NOTE — Telephone Encounter (Signed)
plz phone in. 

## 2014-07-03 ENCOUNTER — Ambulatory Visit (INDEPENDENT_AMBULATORY_CARE_PROVIDER_SITE_OTHER): Payer: Medicare PPO | Admitting: Family Medicine

## 2014-07-03 ENCOUNTER — Encounter: Payer: Self-pay | Admitting: Family Medicine

## 2014-07-03 VITALS — BP 118/74 | HR 84 | Temp 98.4°F | Wt 163.2 lb

## 2014-07-03 DIAGNOSIS — F4323 Adjustment disorder with mixed anxiety and depressed mood: Secondary | ICD-10-CM | POA: Diagnosis not present

## 2014-07-03 DIAGNOSIS — M8580 Other specified disorders of bone density and structure, unspecified site: Secondary | ICD-10-CM

## 2014-07-03 DIAGNOSIS — N183 Chronic kidney disease, stage 3 unspecified: Secondary | ICD-10-CM

## 2014-07-03 NOTE — Assessment & Plan Note (Signed)
Discussed calcium/vit D supplementation and regular weight bearing exercises.

## 2014-07-03 NOTE — Patient Instructions (Addendum)
Return in 2 months for labwork and follow up visit, sooner if needed.  Continue meds as up to now.  You are doing well today.  Continue walking today.

## 2014-07-03 NOTE — Progress Notes (Signed)
Pre visit review using our clinic review tool, if applicable. No additional management support is needed unless otherwise documented below in the visit note. 
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
## 2014-07-03 NOTE — Assessment & Plan Note (Signed)
Consider recheck kidneys in 2 mo (at 50mo mark)

## 2014-07-03 NOTE — Assessment & Plan Note (Signed)
Chronic, stable. Seen regularly and continues doing well. No changes today. Not due yet for rpt UDS (last normal 11/2013).

## 2014-08-04 ENCOUNTER — Encounter: Payer: Self-pay | Admitting: Family Medicine

## 2014-08-04 ENCOUNTER — Ambulatory Visit (INDEPENDENT_AMBULATORY_CARE_PROVIDER_SITE_OTHER): Payer: Medicare PPO | Admitting: Family Medicine

## 2014-08-04 VITALS — BP 128/82 | HR 80 | Temp 98.7°F | Wt 165.5 lb

## 2014-08-04 DIAGNOSIS — W57XXXA Bitten or stung by nonvenomous insect and other nonvenomous arthropods, initial encounter: Secondary | ICD-10-CM

## 2014-08-04 DIAGNOSIS — T148 Other injury of unspecified body region: Secondary | ICD-10-CM | POA: Diagnosis not present

## 2014-08-04 DIAGNOSIS — S40861A Insect bite (nonvenomous) of right upper arm, initial encounter: Secondary | ICD-10-CM | POA: Insufficient documentation

## 2014-08-04 NOTE — Progress Notes (Signed)
   BP 128/82 mmHg  Pulse 80  Temp(Src) 98.7 F (37.1 C) (Oral)  Wt 165 lb 8 oz (75.07 kg)   CC: check tick bite  Subjective:    Patient ID: Karen [name], female    DOB: 06/17/1945, 69 y.o.   MRN: [id number]  HPI: Karen [name] is a 69 y.o. female presenting on 08/04/2014 for Insect Bite   [place]. Tick bite L posterior shoulder (removed 1d ago) and R posterior scalp (removed 1 wk ago). Both removed and unsure how long they were present. No redness around bites.   No other sxs.  Relevant past medical, surgical, family and social history reviewed and updated as indicated. Interim medical history since our last visit reviewed. Allergies and medications reviewed and updated. Current Outpatient Prescriptions on File Prior to Visit  Medication Sig  . aspirin EC 81 MG tablet Take 81 mg by mouth every Monday, Wednesday, and Friday.  Marland Kitchen BIOTIN PO Take 1 tablet by mouth daily.  . Calcium Carb-Cholecalciferol (CALCIUM-VITAMIN D) 600-400 MG-UNIT TABS Take 1 tablet by mouth 2 (two) times daily.  . cetirizine (ZYRTEC) 10 MG tablet Take 10 mg by mouth daily.  . cholecalciferol (VITAMIN D) 1000 UNITS tablet Take 1,000 Units by mouth daily.  Marland Kitchen LORazepam (ATIVAN) 1 MG tablet TAKE 1/2 TABLET BY MOUTH EVERY 8HRS AS NEEDED **MAY TAKE 1/2 TAB IF NEEDED**  . Multiple Vitamin (MULTIVITAMIN) tablet Take 1 tablet by mouth daily.  . Omega-3 Fatty Acids (FISH OIL) 1000 MG CAPS Take 1 capsule by mouth daily.  Marland Kitchen PARoxetine (PAXIL) 40 MG tablet TAKE 1 TABLET (40 MG TOTAL) BY MOUTH EVERY MORNING.  . vitamin E 200 UNIT capsule Take 400 Units by mouth daily.  Marland Kitchen Cod Liver Oil 1000 MG CAPS Take 1 capsule by mouth daily.   No current facility-administered medications on file prior to visit.    Review of Systems Per HPI unless specifically indicated above     Objective:    BP 128/82 mmHg  Pulse 80  Temp(Src) 98.7 F (37.1 C) (Oral)  Wt 165 lb 8 oz (75.07 kg)  Wt Readings from Last  3 Encounters:  08/04/14 165 lb 8 oz (75.07 kg)  07/03/14 163 lb 4 oz (74.05 kg)  05/04/14 166 lb 4 oz (75.411 kg)    Physical Exam  Constitutional: She appears well-developed and well-nourished. No distress.  Skin: Skin is warm. No rash noted. No erythema.  L posterior tick bite with mild surrounding induration without erythema. No bite mark or rash on right posterior scalp  Nursing note and vitals reviewed.     Assessment & Plan:   Problem List Items Addressed This Visit    Tick bites - Primary    No concerns today. Discussed use antibiotic ointment and warm compresses to L posterior arm tick bite site. Discussed sxs to monitor for as per pt instructions.           Follow up plan: Return if symptoms worsen or fail to improve.

## 2014-08-04 NOTE — Assessment & Plan Note (Signed)
No concerns today. Discussed use antibiotic ointment and warm compresses to L posterior arm tick bite site. Discussed sxs to monitor for as per pt instructions.

## 2014-08-04 NOTE — Progress Notes (Signed)
Pre visit review using our clinic review tool, if applicable. No additional management support is needed unless otherwise documented below in the visit note. 

## 2014-08-04 NOTE — Patient Instructions (Signed)
Sites of tick bites look ok. Over next 7-10 days watch for new fever, rash, joint pains, abdominal pain or headaches. If any of this happens, let us know for antibiotics.

## 2014-08-21 ENCOUNTER — Other Ambulatory Visit: Payer: Self-pay | Admitting: Family Medicine

## 2014-08-22 NOTE — Telephone Encounter (Signed)
plz phone in. 

## 2014-08-22 NOTE — Telephone Encounter (Signed)
Rx called to pharmacy

## 2014-08-28 ENCOUNTER — Other Ambulatory Visit: Payer: Self-pay | Admitting: Family Medicine

## 2014-08-28 DIAGNOSIS — N183 Chronic kidney disease, stage 3 unspecified: Secondary | ICD-10-CM

## 2014-08-28 DIAGNOSIS — E785 Hyperlipidemia, unspecified: Secondary | ICD-10-CM

## 2014-08-29 ENCOUNTER — Other Ambulatory Visit: Payer: Medicare PPO

## 2014-09-04 ENCOUNTER — Ambulatory Visit: Payer: Medicare PPO | Admitting: Family Medicine

## 2014-09-05 ENCOUNTER — Encounter: Payer: Self-pay | Admitting: Family Medicine

## 2014-09-05 ENCOUNTER — Ambulatory Visit (INDEPENDENT_AMBULATORY_CARE_PROVIDER_SITE_OTHER): Payer: Medicare PPO | Admitting: Family Medicine

## 2014-09-05 VITALS — BP 126/78 | HR 80 | Temp 98.3°F | Wt 165.2 lb

## 2014-09-05 DIAGNOSIS — F4323 Adjustment disorder with mixed anxiety and depressed mood: Secondary | ICD-10-CM

## 2014-09-05 DIAGNOSIS — N183 Chronic kidney disease, stage 3 unspecified: Secondary | ICD-10-CM

## 2014-09-05 DIAGNOSIS — M8580 Other specified disorders of bone density and structure, unspecified site: Secondary | ICD-10-CM

## 2014-09-05 NOTE — Assessment & Plan Note (Signed)
Continue monitoring kidneys closely, discussed importance of good hydration status

## 2014-09-05 NOTE — Assessment & Plan Note (Signed)
Chronic. Continue paxil 40mg  daily + ativan prn. Very stable. ?ADHD.

## 2014-09-05 NOTE — Progress Notes (Signed)
Pre visit review using our clinic review tool, if applicable. No additional management support is needed unless otherwise documented below in the visit note. 

## 2014-09-05 NOTE — Patient Instructions (Addendum)
Good to see you today, call us with questions. Increase water intake daily to make sure you stay well hydrated. Return in 4 months for medicare wellness visit

## 2014-09-05 NOTE — Assessment & Plan Note (Signed)
Reviewed cal/vit D intake and need for regular weight bearing exercises.

## 2014-09-05 NOTE — Progress Notes (Signed)
BP 126/78 mmHg  Pulse 80  Temp(Src) 98.3 F (36.8 C) (Oral)  Wt 165 lb 4 oz (74.957 kg)   CC: f/u visit  Subjective:    Patient ID: Karen [name], female    DOB: Aug 25, 1945, 69 y.o.   MRN: [id number]  HPI: Karen [name] is a 69 y.o. female presenting on 09/05/2014 for Follow-up   Excited muscadine grapes are coming in.   Friend recently died from bacterial infection, MI/CVA. Had wake today.   To restart shakley vitamins.   Here for 2 mo f/u mood disorder stable on paroxetine 40mg  daily + ativan PRN.   Mowing and walking 2 miles regularly at [facility]. Discussed hydration importance.  CKD stage 3 - stable over last few years. Stable renal US on last check.  Osteopenia T -2.4 - discussed again. Regular weight bearing exercise.   Relevant past medical, surgical, family and social history reviewed and updated as indicated. Interim medical history since our last visit reviewed. Allergies and medications reviewed and updated. Current Outpatient Prescriptions on File Prior to Visit  Medication Sig  . aspirin EC 81 MG tablet Take 81 mg by mouth every Monday, Wednesday, and Friday.  Marland Kitchen BIOTIN PO Take 1 tablet by mouth daily.  . Calcium Carb-Cholecalciferol (CALCIUM-VITAMIN D) 600-400 MG-UNIT TABS Take 1 tablet by mouth 2 (two) times daily.  . cetirizine (ZYRTEC) 10 MG tablet Take 10 mg by mouth daily.  . cholecalciferol (VITAMIN D) 1000 UNITS tablet Take 1,000 Units by mouth daily.  Marland Kitchen Cod Liver Oil 1000 MG CAPS Take 1 capsule by mouth daily.  Marland Kitchen LORazepam (ATIVAN) 1 MG tablet TAKE 1/2 TABLET BY MOUTH EVERY 8 HOURS AS NEEDED. MAY TAKE ADDITIONAL 1/2 TABLET IF NEEDED  . Multiple Vitamin (MULTIVITAMIN) tablet Take 1 tablet by mouth daily.  . Omega-3 Fatty Acids (FISH OIL) 1000 MG CAPS Take 1 capsule by mouth daily.  Marland Kitchen PARoxetine (PAXIL) 40 MG tablet TAKE 1 TABLET (40 MG TOTAL) BY MOUTH EVERY MORNING.  . vitamin E 200 UNIT capsule Take 400 Units by mouth daily.   No current  facility-administered medications on file prior to visit.    Review of Systems Per HPI unless specifically indicated above     Objective:    BP 126/78 mmHg  Pulse 80  Temp(Src) 98.3 F (36.8 C) (Oral)  Wt 165 lb 4 oz (74.957 kg)  Wt Readings from Last 3 Encounters:  09/05/14 165 lb 4 oz (74.957 kg)  08/04/14 165 lb 8 oz (75.07 kg)  07/03/14 163 lb 4 oz (74.05 kg)   Body mass index is 29.28 kg/(m^2).  Physical Exam  Constitutional: She appears well-developed and well-nourished. No distress.  HENT:  Mouth/Throat: Oropharynx is clear and moist. No oropharyngeal exudate.  Eyes: Conjunctivae and EOM are normal. Pupils are equal, round, and reactive to light. No scleral icterus.  Cardiovascular: Normal rate, regular rhythm, normal heart sounds and intact distal pulses.   No murmur heard. Pulmonary/Chest: Effort normal and breath sounds normal. No respiratory distress. She has no wheezes. She has no rales.  Musculoskeletal: She exhibits no edema.  Skin: Skin is warm and dry. No rash noted.  Psychiatric: She has a normal mood and affect.  Nursing note and vitals reviewed.     Assessment & Plan:   Problem List Items Addressed This Visit    Adjustment disorder with mixed anxiety and depressed mood - Primary    Chronic. Continue paxil 40mg  daily + ativan prn. Very stable. ?ADHD.  CKD (chronic kidney disease) stage 3, GFR 30-59 ml/min    Continue monitoring kidneys closely, discussed importance of good hydration status      Osteopenia    Reviewed cal/vit D intake and need for regular weight bearing exercises.          Follow up plan: Return in about 4 months (around 01/05/2015), or as needed, for medicare wellness visit.

## 2014-09-18 ENCOUNTER — Encounter: Payer: Self-pay | Admitting: Family Medicine

## 2014-09-18 ENCOUNTER — Telehealth: Payer: Self-pay | Admitting: Family Medicine

## 2014-09-18 NOTE — Telephone Encounter (Signed)
Pt has  Had spells where she has a "sick headache" and in the beginning of it she sees "sparkly things" and takes a regular Asprin and they go away.  She has had a couple lately.  Not having having headaches now, just wanted you to know.

## 2014-09-18 NOTE — Telephone Encounter (Signed)
Noted. Has h/o ocular migraines. When did she last see eye doctor? If >1 yr rec return to see them.

## 2014-09-19 NOTE — Telephone Encounter (Signed)
Message left advising patient.  

## 2014-09-21 ENCOUNTER — Ambulatory Visit (INDEPENDENT_AMBULATORY_CARE_PROVIDER_SITE_OTHER): Payer: Medicare PPO | Admitting: Primary Care

## 2014-09-21 ENCOUNTER — Telehealth: Payer: Self-pay

## 2014-09-21 ENCOUNTER — Encounter: Payer: Self-pay | Admitting: Primary Care

## 2014-09-21 VITALS — BP 124/76 | HR 80 | Temp 98.1°F | Ht 63.0 in | Wt 163.8 lb

## 2014-09-21 DIAGNOSIS — L03818 Cellulitis of other sites: Secondary | ICD-10-CM

## 2014-09-21 MED ORDER — DOXYCYCLINE HYCLATE 100 MG PO TABS: 100.0000 mg | ORAL_TABLET | Freq: Two times a day (BID) | ORAL | Status: DC

## 2014-09-21 NOTE — Telephone Encounter (Signed)
Pt was seen 09/21/14 and was told had bacterial infection and pt wants Dr Darnell Level to review office note because he is pts PCP and she values his opinion. Pt request cb.

## 2014-09-21 NOTE — Progress Notes (Signed)
Pre visit review using our clinic review tool, if applicable. No additional management support is needed unless otherwise documented below in the visit note. 

## 2014-09-21 NOTE — Patient Instructions (Signed)
Start Doxycycline antibiotics. Take 1 tablet by mouth twice daily for 10 days for your wound.  There is no tick in your wound. Stop picking at your skin. You may apply neosporin to skin daily.  It was very nice meeting you!
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
## 2014-09-21 NOTE — Telephone Encounter (Signed)
Appt has been scheduled.

## 2014-09-21 NOTE — Telephone Encounter (Signed)
plz schedule appt?

## 2014-09-21 NOTE — Telephone Encounter (Signed)
PLEASE NOTE: All timestamps contained within this report are represented as [place] Standard Time. CONFIDENTIALTY NOTICE: This fax transmission is intended only for the addressee. It contains information that is legally privileged, confidential or otherwise protected from use or disclosure. If you are not the intended recipient, you are strictly prohibited from reviewing, disclosing, copying using or disseminating any of this information or taking any action in reliance on or regarding this information. If you have received this fax in error, please notify us immediately by telephone so that we can arrange for its return to [place]. Phone: [phone], Toll-Free: [phone], Fax: [phone] Page: 1 of 1 Call Id: [id number] [place] Patient Name: [facility] [name] Gender: Female DOB: [birth year] Age: 69 Y 30 M 13 D Return Phone Number: [phone] (Primary) Address: City/State/Zip: [place] Client [place] [facility] Night - Client Client Site [place] Physician Ria Bush Contact Type Call [place] Name anon Caller Phone Number na Relationship To Patient Self Is this call to report lab results? No Call Type General Information Initial Comment Caller states she has a place on her hip that is red and swollen. Would like to schedule an appointment. General Information Type Appointment Nurse Assessment Guidelines Guideline Title Affirmed Question Affirmed Notes Nurse Date/Time (Eastern Time) Disp. Time [name] Time) Disposition Final User 09/20/2014 9:41:11 PM General Information Provided Yes Rayder, Bettey Mare After Care Instructions Given Call Event Type User Date / Time Description

## 2014-09-22 NOTE — Telephone Encounter (Signed)
Message left advising patient.  

## 2014-09-22 NOTE — Telephone Encounter (Signed)
plz notify I reviewed chart and agree with management. Finish doxy course, avoid sun while on this antibiotic

## 2014-10-03 ENCOUNTER — Telehealth: Payer: Self-pay

## 2014-10-03 NOTE — Telephone Encounter (Signed)
Pt finished doxycyline on 10/03/14 and still has scab on area that was infected; area is still slightly red but no drainage; pt wants to know if normal for scab to stay on this long and should pt take anymore abx. Please advise.[facility].

## 2014-10-03 NOTE — Telephone Encounter (Signed)
Attempted to contact pt; line d/c.

## 2014-10-03 NOTE — Telephone Encounter (Signed)
Scab has been present a long time , but this could happen. Would just watch as long as she's not tender at area or no redness or draining pus. Could try vaseline on scab and may help it fall off quicker. If any concerns can come in for a check.

## 2014-10-04 NOTE — Telephone Encounter (Signed)
Message left advising patient.  

## 2014-10-05 ENCOUNTER — Ambulatory Visit: Payer: Medicare PPO | Admitting: Family Medicine

## 2014-10-06 ENCOUNTER — Ambulatory Visit: Payer: Medicare PPO | Admitting: Family Medicine

## 2014-10-20 ENCOUNTER — Other Ambulatory Visit: Payer: Self-pay | Admitting: *Deleted

## 2014-10-20 DIAGNOSIS — L821 Other seborrheic keratosis: Secondary | ICD-10-CM | POA: Diagnosis not present

## 2014-10-20 DIAGNOSIS — Z85828 Personal history of other malignant neoplasm of skin: Secondary | ICD-10-CM | POA: Diagnosis not present

## 2014-10-20 DIAGNOSIS — L910 Hypertrophic scar: Secondary | ICD-10-CM | POA: Diagnosis not present

## 2014-10-20 MED ORDER — LORAZEPAM 1 MG PO TABS: ORAL_TABLET | ORAL | Status: DC

## 2014-10-20 NOTE — Telephone Encounter (Signed)
Medication phoned to pharmacy.  

## 2014-10-20 NOTE — Telephone Encounter (Signed)
Pt left voicemail at Triage. Pt is requesting refill of Rx, pt request call once done, Dr. Darnell Level out of the office, please advise

## 2014-10-20 NOTE — Telephone Encounter (Signed)
Please call in.  Thanks.   

## 2014-11-21 DIAGNOSIS — K625 Hemorrhage of anus and rectum: Secondary | ICD-10-CM | POA: Diagnosis not present

## 2014-11-21 DIAGNOSIS — K5900 Constipation, unspecified: Secondary | ICD-10-CM | POA: Diagnosis not present

## 2014-11-21 DIAGNOSIS — K642 Third degree hemorrhoids: Secondary | ICD-10-CM | POA: Diagnosis not present

## 2014-11-21 DIAGNOSIS — Z1211 Encounter for screening for malignant neoplasm of colon: Secondary | ICD-10-CM | POA: Diagnosis not present

## 2014-11-21 LAB — BASIC METABOLIC PANEL
Creat: 1.15
EGFR (Non-African Amer.): 49
Glucose: 93
Potassium: 3.9 mmol/L
Sodium: 142

## 2014-11-21 LAB — TSH: TSH: 4.44 u[IU]/mL (ref 0.41–5.90)

## 2014-11-21 LAB — HEPATIC FUNCTION PANEL
ALT: 24
AST: 22 U/L
Albumin: 4.2
Alkaline Phosphatase: 96 U/L
Total Bilirubin: 0.5 mg/dL

## 2014-11-21 LAB — CBC
HGB: 14.4 g/dL
Platelet: 182
WBC: 5.7

## 2014-11-29 ENCOUNTER — Encounter: Payer: Self-pay | Admitting: *Deleted

## 2014-12-24 ENCOUNTER — Other Ambulatory Visit: Payer: Self-pay | Admitting: Family Medicine

## 2014-12-25 ENCOUNTER — Telehealth: Payer: Self-pay | Admitting: *Deleted

## 2014-12-25 ENCOUNTER — Other Ambulatory Visit: Payer: Self-pay | Admitting: Family Medicine

## 2014-12-25 NOTE — Telephone Encounter (Signed)
Rx called in as directed.   

## 2014-12-25 NOTE — Telephone Encounter (Signed)
[place] Night - Client TELEPHONE ADVICE RECORD [facility] Patient Name: [facility] Schoenherr Gender: Female DOB: 1946/03/02 Age: 69 Y 9 M 15 D Return Phone Number: [phone] (Primary) Address: City/State/Zip: [place] Client [place] Night - Client Client Site [place] Physician Ria Bush Contact Type Call Call Type Triage / Clinical Relationship To Patient Self Return Phone Number [phone] (Primary) Chief Complaint Prescription Refill or Medication Request (non symptomatic) Initial Comment Caller states her Rx could not be filled. Wants to know why. Nurse Assessment Guidelines Guideline Title Affirmed Question Affirmed Notes Nurse Date/Time (Eastern Time) Disp. Time [name] Time) Disposition Final User 12/24/2014 5:51:27 PM Attempt made - no message left Venetia Maxon, RN, Manuela Schwartz 12/24/2014 6:10:52 PM FINAL ATTEMPT MADE - no message left Yes Venetia Maxon, RN, Manuela Schwartz After Care Instructions Given Call Event Type User Date / Time

## 2014-12-25 NOTE — Telephone Encounter (Signed)
Rx was called in this AM. Patient aware.

## 2014-12-25 NOTE — Telephone Encounter (Signed)
Patient left a voicemail stating that she needs her medication called in because she is completely out.

## 2014-12-25 NOTE — Telephone Encounter (Signed)
plz phone in. 

## 2014-12-25 NOTE — Telephone Encounter (Signed)
plz notify Rx ready to pick up.

## 2014-12-30 HISTORY — PX: COLONOSCOPY: SHX174

## 2015-01-05 ENCOUNTER — Encounter: Payer: Self-pay | Admitting: Radiology

## 2015-01-05 ENCOUNTER — Ambulatory Visit: Payer: Medicare PPO | Admitting: Family Medicine

## 2015-01-05 ENCOUNTER — Ambulatory Visit (INDEPENDENT_AMBULATORY_CARE_PROVIDER_SITE_OTHER): Payer: Medicare PPO | Admitting: Family Medicine

## 2015-01-05 ENCOUNTER — Encounter: Payer: Self-pay | Admitting: Family Medicine

## 2015-01-05 VITALS — BP 128/78 | HR 80 | Temp 98.3°F | Wt 165.8 lb

## 2015-01-05 DIAGNOSIS — Z23 Encounter for immunization: Secondary | ICD-10-CM | POA: Diagnosis not present

## 2015-01-05 DIAGNOSIS — Z79899 Other long term (current) drug therapy: Secondary | ICD-10-CM | POA: Diagnosis not present

## 2015-01-05 DIAGNOSIS — F4323 Adjustment disorder with mixed anxiety and depressed mood: Secondary | ICD-10-CM | POA: Diagnosis not present

## 2015-01-05 MED ORDER — PAROXETINE HCL 40 MG PO TABS: ORAL_TABLET | ORAL | Status: DC

## 2015-01-05 NOTE — Patient Instructions (Addendum)
Return in 3 months for wellness visit, prior fasting for blood work. Continue current medicines.  UDS today. Nice to see you today, call us with questions.

## 2015-01-05 NOTE — Progress Notes (Signed)
BP 128/78 mmHg  Pulse 80  Temp(Src) 98.3 F (36.8 C) (Oral)  Wt 165 lb 12 oz (75.184 kg)   CC: 4 mo f/u visit  Subjective:    Patient ID: Karen [name], female    DOB: 1946-02-24, 69 y.o.   MRN: [id number]  HPI: Karen [name] is a 69 y.o. 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 Takes lorazepam 36m 1/2 tablet twice daily. Stable on this does. Functions well. [facility]  Upcoming colonoscopy on Monday with Dr MCollene Mares   Relevant past medical, surgical, family and social history reviewed and updated as indicated. Interim medical history since our last visit reviewed. Allergies and medications reviewed and updated. Current Outpatient Prescriptions on File Prior to Visit  Medication Sig  . Calcium Carb-Cholecalciferol (CALCIUM-VITAMIN D) 600-400 MG-UNIT TABS Take 1 tablet by mouth 2 (two) times daily.  . cetirizine (ZYRTEC) 10 MG tablet Take 10 mg by mouth daily.  . cholecalciferol (VITAMIN D) 1000 UNITS tablet Take 1,000 Units by mouth daily.  .Marland KitchenLORazepam (ATIVAN) 1 MG tablet TAKE 1/2 TABLET BY MOUTH EVERY 8 HOURS AS NEEDED. MAY TAKE AN ADDITIONAL 1/2 TABLET IF NEEDED  . Multiple Vitamin (MULTIVITAMIN) tablet Take 1 tablet by mouth daily.  .Marland Kitchenaspirin EC 81 MG tablet Take 81 mg by mouth every Monday, Wednesday, and Friday.  .Marland KitchenCod Liver Oil 1000 MG CAPS Take 1 capsule by mouth daily.  . Omega-3 Fatty Acids (FISH OIL) 1000 MG CAPS Take 1 capsule by mouth daily.  . vitamin E 200 UNIT capsule Take 400 Units by mouth daily.   No current facility-administered medications on file prior to visit.    Review of Systems Per HPI unless specifically indicated above     Objective:    BP 128/78 mmHg  Pulse 80  Temp(Src) 98.3 F (36.8 C) (Oral)  Wt 165 lb 12 oz (75.184 kg)  Wt Readings from Last 3 Encounters:  01/05/15 165 lb 12 oz (75.184 kg)  09/21/14 163 lb 12.8 oz (74.299  kg)  09/05/14 165 lb 4 oz (74.957 kg)    Physical Exam  Constitutional: She appears well-developed and well-nourished. No distress.  HENT:  Mouth/Throat: Oropharynx is clear and moist. No oropharyngeal exudate.  Neck: Normal range of motion. Neck supple.  Cardiovascular: Normal rate, regular rhythm, normal heart sounds and intact distal pulses.   No murmur heard. Pulmonary/Chest: Effort normal and breath sounds normal. No respiratory distress. She has no wheezes. She has no rales.  Skin: Skin is warm and dry. No rash noted.  Psychiatric: She has a normal mood and affect. Her behavior is normal. Judgment and thought content normal.  Nursing note and vitals reviewed.  Results for orders placed or performed in visit on 11/29/14  Hepatic function panel  Result Value Ref Range   Albumin 4.2    Total Bilirubin 0.5 mg/dL   Alkaline Phosphatase 96 U/L   AST 22 U/L   ALT 24   CBC  Result Value Ref Range   WBC 5.7    HGB 14.4 g/dL   Platelet 1063  Basic metabolic panel  Result Value Ref Range   Glucose 93    Creat 1.15    EGFR (Non-African Amer.) 49    Sodium 142    Potassium 3.9 mmol/L  TSH  Result Value Ref Range   TSH 4.44 0.41 - 5.90 uIU/mL      Assessment &  Plan:   Problem List Items Addressed This Visit    Adjustment disorder with mixed anxiety and depressed mood    Chronic, stable.  UDS today as pt on longterm lorazepam. Discussed decreasing lorazepam use to PRN anxiety/sleep.       Other Visit Diagnoses    Need for influenza vaccination    -  Primary    Relevant Orders    Flu Vaccine QUAD 36+ mos PF IM (Fluarix & Fluzone Quad PF) (Completed)        Follow up plan: Return in about 3 months (around 04/07/2015), or as needed, for medicare wellness visit.

## 2015-01-05 NOTE — Assessment & Plan Note (Signed)
Chronic, stable.  UDS today as pt on longterm lorazepam. Discussed decreasing lorazepam use to PRN anxiety/sleep.

## 2015-01-05 NOTE — Progress Notes (Signed)
Pre visit review using our clinic review tool, if applicable. No additional management support is needed unless otherwise documented below in the visit note. 

## 2015-01-06 ENCOUNTER — Other Ambulatory Visit: Payer: Self-pay | Admitting: Family Medicine

## 2015-01-08 ENCOUNTER — Encounter: Payer: Self-pay | Admitting: Family Medicine

## 2015-01-08 DIAGNOSIS — K635 Polyp of colon: Secondary | ICD-10-CM | POA: Diagnosis not present

## 2015-01-08 DIAGNOSIS — D125 Benign neoplasm of sigmoid colon: Secondary | ICD-10-CM | POA: Diagnosis not present

## 2015-01-08 DIAGNOSIS — K5730 Diverticulosis of large intestine without perforation or abscess without bleeding: Secondary | ICD-10-CM | POA: Diagnosis not present

## 2015-01-08 DIAGNOSIS — K625 Hemorrhage of anus and rectum: Secondary | ICD-10-CM | POA: Diagnosis not present

## 2015-01-08 DIAGNOSIS — Z1211 Encounter for screening for malignant neoplasm of colon: Secondary | ICD-10-CM | POA: Diagnosis not present

## 2015-01-17 ENCOUNTER — Other Ambulatory Visit: Payer: Self-pay

## 2015-01-17 DIAGNOSIS — Z1231 Encounter for screening mammogram for malignant neoplasm of breast: Secondary | ICD-10-CM

## 2015-01-24 ENCOUNTER — Ambulatory Visit: Payer: Medicare PPO

## 2015-01-31 ENCOUNTER — Other Ambulatory Visit (INDEPENDENT_AMBULATORY_CARE_PROVIDER_SITE_OTHER): Payer: Medicare PPO

## 2015-01-31 DIAGNOSIS — E785 Hyperlipidemia, unspecified: Secondary | ICD-10-CM | POA: Diagnosis not present

## 2015-01-31 DIAGNOSIS — N183 Chronic kidney disease, stage 3 unspecified: Secondary | ICD-10-CM

## 2015-01-31 LAB — RENAL FUNCTION PANEL
Albumin: 4 g/dL (ref 3.5–5.2)
BUN: 13 mg/dL (ref 6–23)
CO2: 27 mEq/L (ref 19–32)
Calcium: 9.4 mg/dL (ref 8.4–10.5)
Chloride: 105 mEq/L (ref 96–112)
Creatinine, Ser: 1.1 mg/dL (ref 0.40–1.20)
GFR: 52.21 mL/min — ABNORMAL LOW (ref >60.00)
Glucose, Bld: 93 mg/dL (ref 70–99)
Phosphorus: 3.3 mg/dL (ref 2.3–4.6)
Potassium: 3.7 mEq/L (ref 3.5–5.1)
Sodium: 141 mEq/L (ref 135–145)

## 2015-01-31 LAB — VITAMIN D 25 HYDROXY (VIT D DEFICIENCY, FRACTURES): VITD: 29.42 ng/mL — ABNORMAL LOW (ref 30.00–100.00)

## 2015-01-31 LAB — LDL CHOLESTEROL, DIRECT: Direct LDL: 135 mg/dL

## 2015-02-01 ENCOUNTER — Encounter: Payer: Self-pay | Admitting: Family Medicine

## 2015-02-01 LAB — PTH, INTACT AND CALCIUM
Calcium: 9.2 mg/dL (ref 8.4–10.5)
PTH: 39 pg/mL (ref 14–64)

## 2015-02-07 ENCOUNTER — Ambulatory Visit (INDEPENDENT_AMBULATORY_CARE_PROVIDER_SITE_OTHER): Payer: Medicare PPO | Admitting: Family Medicine

## 2015-02-07 ENCOUNTER — Encounter: Payer: Self-pay | Admitting: Family Medicine

## 2015-02-07 VITALS — BP 120/76 | HR 78 | Temp 98.5°F | Ht 62.75 in | Wt 164.0 lb

## 2015-02-07 DIAGNOSIS — N183 Chronic kidney disease, stage 3 unspecified: Secondary | ICD-10-CM

## 2015-02-07 DIAGNOSIS — Z0000 Encounter for general adult medical examination without abnormal findings: Secondary | ICD-10-CM

## 2015-02-07 DIAGNOSIS — Z7189 Other specified counseling: Secondary | ICD-10-CM | POA: Insufficient documentation

## 2015-02-07 DIAGNOSIS — M8580 Other specified disorders of bone density and structure, unspecified site: Secondary | ICD-10-CM

## 2015-02-07 DIAGNOSIS — E785 Hyperlipidemia, unspecified: Secondary | ICD-10-CM

## 2015-02-07 DIAGNOSIS — F4323 Adjustment disorder with mixed anxiety and depressed mood: Secondary | ICD-10-CM

## 2015-02-07 NOTE — Patient Instructions (Addendum)
Continue paxil 14m daily. Consider lower dose 36min the future. Sign release of records for colonoscopy from Dr MaCollene MaresAdvanced directive packet provided today - update HCPOA form. Nice to see you today, call [place] questions. Return as needed or in 3-4 months for follow up  Health Maintenance, Female Adopting a healthy lifestyle and getting preventive care can go a long way to promote health and wellness. Talk with your health care provider about what schedule of regular examinations is right for you. This is a good chance for you to check in with your provider about disease prevention and staying healthy. In between checkups, there are plenty of things you can do on your own. Experts have done a lot of research about which lifestyle changes and preventive measures are most likely to keep you healthy. Ask your health care provider for more information. WEIGHT AND DIET  Eat a healthy diet  Be sure to include plenty of vegetables, fruits, low-fat dairy products, and lean protein.  Do not eat a lot of foods high in solid fats, added sugars, or salt.  Get regular exercise. This is one of the most important things you can do for your health.  Most adults should exercise for at least 150 minutes each week. The exercise should increase your heart rate and make you sweat (moderate-intensity exercise).  Most adults should also do strengthening exercises at least twice a week. This is in addition to the moderate-intensity exercise.  Maintain a healthy weight  Body mass index (BMI) is a measurement that can be used to identify possible weight problems. It estimates body fat based on height and weight. Your health care provider can help determine your BMI and help you achieve or maintain a healthy weight.  For females 2055ears of age and older:   A BMI below 18.5 is considered underweight.  A BMI of 18.5 to 24.9 is normal.  A BMI of 25 to 29.9 is considered overweight.  A BMI of 30 and  above is considered obese.  Watch levels of cholesterol and blood lipids  You should start having your blood tested for lipids and cholesterol at 2072ears of age, then have this test every 5 years.  You may need to have your cholesterol levels checked more often if:  Your lipid or cholesterol levels are high.  You are older than 5050ears of age.  You are at high risk for heart disease.  CANCER SCREENING   Lung Cancer  Lung cancer screening is recommended for adults 555062ears old who are at high risk for lung cancer because of a history of smoking.  A yearly low-dose CT scan of the lungs is recommended for people who:  Currently smoke.  Have quit within the past 15 years.  Have at least a 30-pack-year history of smoking. A pack year is smoking an average of one pack of cigarettes a day for 1 year.  Yearly screening should continue until it has been 15 years since you quit.  Yearly screening should stop if you develop a health problem that would prevent you from having lung cancer treatment.  Breast Cancer  Practice breast self-awareness. This means understanding how your breasts normally appear and feel.  It also means doing regular breast self-exams. Let your health care provider know about any changes, no matter how small.  If you are in your 20s or 30s, you should have a clinical breast exam (CBE) by a health care provider every 1-3 years as  part of a regular health exam.  If you are 40 or older, have a CBE every year. Also consider having a breast X-ray (mammogram) every year.  If you have a family history of breast cancer, talk to your health care provider about genetic screening.  If you are at high risk for breast cancer, talk to your health care provider about having an MRI and a mammogram every year.  Breast cancer gene (BRCA) assessment is recommended for women who have family members with BRCA-related cancers. BRCA-related cancers  include:  Breast.  Ovarian.  Tubal.  Peritoneal cancers.  Results of the assessment will determine the need for genetic counseling and BRCA1 and BRCA2 testing. Cervical Cancer Your health care provider may recommend that you be screened regularly for cancer of the pelvic organs (ovaries, uterus, and vagina). This screening involves a pelvic examination, including checking for microscopic changes to the surface of your cervix (Pap test). You may be encouraged to have this screening done every 3 years, beginning at age 21.  For women ages 30-65, health care providers may recommend pelvic exams and Pap testing every 3 years, or they may recommend the Pap and pelvic exam, combined with testing for human papilloma virus (HPV), every 5 years. Some types of HPV increase your risk of cervical cancer. Testing for HPV may also be done on women of any age with unclear Pap test results.  Other health care providers may not recommend any screening for nonpregnant women who are considered low risk for pelvic cancer and who do not have symptoms. Ask your health care provider if a screening pelvic exam is right for you.  If you have had past treatment for cervical cancer or a condition that could lead to cancer, you need Pap tests and screening for cancer for at least 20 years after your treatment. If Pap tests have been discontinued, your risk factors (such as having a new sexual partner) need to be reassessed to determine if screening should resume. Some women have medical problems that increase the chance of getting cervical cancer. In these cases, your health care provider may recommend more frequent screening and Pap tests. Colorectal Cancer  This type of cancer can be detected and often prevented.  Routine colorectal cancer screening usually begins at 70 years of age and continues through 70 years of age.  Your health care provider may recommend screening at an earlier age if you have risk factors for  colon cancer.  Your health care provider may also recommend using home test kits to check for hidden blood in the stool.  A small camera at the end of a tube can be used to examine your colon directly (sigmoidoscopy or colonoscopy). This is done to check for the earliest forms of colorectal cancer.  Routine screening usually begins at age 50.  Direct examination of the colon should be repeated every 5-10 years through 70 years of age. However, you may need to be screened more often if early forms of precancerous polyps or small growths are found. Skin Cancer  Check your skin from head to toe regularly.  Tell your health care provider about any new moles or changes in moles, especially if there is a change in a mole's shape or color.  Also tell your health care provider if you have a mole that is larger than the size of a pencil eraser.  Always use sunscreen. Apply sunscreen liberally and repeatedly throughout the day.  Protect yourself by wearing long sleeves, pants,   a wide-brimmed hat, and sunglasses whenever you are outside. HEART DISEASE, DIABETES, AND HIGH BLOOD PRESSURE   High blood pressure causes heart disease and increases the risk of stroke. High blood pressure is more likely to develop in:  People who have blood pressure in the high end of the normal range (130-139/85-89 mm Hg).  People who are overweight or obese.  People who are African American.  If you are 18-39 years of age, have your blood pressure checked every 3-5 years. If you are 40 years of age or older, have your blood pressure checked every year. You should have your blood pressure measured twice--once when you are at a hospital or clinic, and once when you are not at a hospital or clinic. Record the average of the two measurements. To check your blood pressure when you are not at a hospital or clinic, you can use:  An automated blood pressure machine at a pharmacy.  A home blood pressure monitor.  If you  are between 55 years and 79 years old, ask your health care provider if you should take aspirin to prevent strokes.  Have regular diabetes screenings. This involves taking a blood sample to check your fasting blood sugar level.  If you are at a normal weight and have a low risk for diabetes, have this test once every three years after 70 years of age.  If you are overweight and have a high risk for diabetes, consider being tested at a younger age or more often. PREVENTING INFECTION  Hepatitis B  If you have a higher risk for hepatitis B, you should be screened for this virus. You are considered at high risk for hepatitis B if:  You were born in a country where hepatitis B is common. Ask your health care provider which countries are considered high risk.  Your parents were born in a high-risk country, and you have not been immunized against hepatitis B (hepatitis B vaccine).  You have HIV or AIDS.  You use needles to inject street drugs.  You live with someone who has hepatitis B.  You have had sex with someone who has hepatitis B.  You get hemodialysis treatment.  You take certain medicines for conditions, including cancer, organ transplantation, and autoimmune conditions. Hepatitis C  Blood testing is recommended for:  Everyone born from 1945 through 1965.  Anyone with known risk factors for hepatitis C. Sexually transmitted infections (STIs)  You should be screened for sexually transmitted infections (STIs) including gonorrhea and chlamydia if:  You are sexually active and are younger than 70 years of age.  You are older than 70 years of age and your health care provider tells you that you are at risk for this type of infection.  Your sexual activity has changed since you were last screened and you are at an increased risk for chlamydia or gonorrhea. Ask your health care provider if you are at risk.  If you do not have HIV, but are at risk, it may be recommended that you  take a prescription medicine daily to prevent HIV infection. This is called pre-exposure prophylaxis (PrEP). You are considered at risk if:  You are sexually active and do not regularly use condoms or know the HIV status of your partner(s).  You take drugs by injection.  You are sexually active with a partner who has HIV. Talk with your health care provider about whether you are at high risk of being infected with HIV. If you choose to begin   PrEP, you should first be tested for HIV. You should then be tested every 3 months for as long as you are taking PrEP.  PREGNANCY   If you are premenopausal and you may become pregnant, ask your health care provider about preconception counseling.  If you may become pregnant, take 400 to 800 micrograms (mcg) of folic acid every day.  If you want to prevent pregnancy, talk to your health care provider about birth control (contraception). OSTEOPOROSIS AND MENOPAUSE   Osteoporosis is a disease in which the bones lose minerals and strength with aging. This can result in serious bone fractures. Your risk for osteoporosis can be identified using a bone density scan.  If you are 34 years of age or older, or if you are at risk for osteoporosis and fractures, ask your health care provider if you should be screened.  Ask your health care provider whether you should take a calcium or vitamin D supplement to lower your risk for osteoporosis.  Menopause may have certain physical symptoms and risks.  Hormone replacement therapy may reduce some of these symptoms and risks. Talk to your health care provider about whether hormone replacement therapy is right for you.  HOME CARE INSTRUCTIONS   Schedule regular health, dental, and eye exams.  Stay current with your immunizations.   Do not use any tobacco products including cigarettes, chewing tobacco, or electronic cigarettes.  If you are pregnant, do not drink alcohol.  If you are breastfeeding, limit how  much and how often you drink alcohol.  Limit alcohol intake to no more than 1 drink per day for nonpregnant women. One drink equals 12 ounces of beer, 5 ounces of wine, or 1 ounces of hard liquor.  Do not use street drugs.  Do not share needles.  Ask your health care provider for help if you need support or information about quitting drugs.  Tell your health care provider if you often feel depressed.  Tell your health care provider if you have ever been abused or do not feel safe at home.   This information is not intended to replace advice given to you by your health care provider. Make sure you discuss any questions you have with your health care provider.   Document Released: 09/30/2010 Document Revised: 04/07/2014 Document Reviewed: 02/16/2013 Elsevier Interactive Patient Education [employer].

## 2015-02-07 NOTE — Progress Notes (Signed)
Pre visit review using our clinic review tool, if applicable. No additional management support is needed unless otherwise documented below in the visit note. 

## 2015-02-07 NOTE — Assessment & Plan Note (Signed)
Reviewed with patient. rec diet changes to improve LDL.

## 2015-02-07 NOTE — Assessment & Plan Note (Signed)
Chronic, stable. Continue paxil and lorazepam 1/2 tab bid.

## 2015-02-07 NOTE — Assessment & Plan Note (Signed)
Stable.       - Continue to monitor

## 2015-02-07 NOTE — Assessment & Plan Note (Signed)
Preventative protocols reviewed and updated unless pt declined. Discussed healthy diet and lifestyle.  

## 2015-02-07 NOTE — Assessment & Plan Note (Signed)
Discussed dietary sources of calcium. Reviewed cal/vit D supplementation.

## 2015-02-07 NOTE — Progress Notes (Signed)
BP 120/76 mmHg  Pulse 78  Temp(Src) 98.5 F (36.9 C) (Oral)  Ht 5' 2.75" (1.594 m)  Wt 164 lb (74.39 kg)  BMI 29.28 kg/m2  SpO2 96%   CC: medicare wellness visit Subjective:    Patient ID: Karen [name], female    DOB: 08/19/1945, 69 y.o.   MRN: [id number]  HPI: Karen [name] is a 69 y.o. female presenting on 02/07/2015 for Annual Exam   Hearing screen with audiologist - will get [place] records Vision screen with Dr Donnald Garre = 1 1 fall in last year - mechanical, tripped. No dizziness or premonitory sxs. No injury  Preventative: COLONOSCOPY Date: 12/2014 1 polyp - will request records today Collene Mares) Pending mammogram. Well woman - s/p hysterectomy for benign reasons. Ovaries removed.  Flu - 12/2014 Pneumovax - 2014. prevnar 2015 Td 1999. May have had another during her accident but unsure.  Zostavax 2012  Osteopenia Date: 11/2012 T score -2.4 AP spine  Advanced directives: thinks she has at home. Asked to bring me copy. Doesn't want prolonged life support. HCPOA is children but this has not been updated. Packet provided today. Seat belt use discussed Sunscreen use discussed. No changing moles on skin.  Caffeine: lots of pepsi  Lives alone - children live nearby.  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 Interim medical history since our last visit reviewed. Allergies and medications reviewed and updated. Current Outpatient Prescriptions on File Prior to Visit  Medication Sig  . aspirin EC 81 MG tablet Take 81 mg by mouth every Monday, Wednesday, and Friday.  . Calcium Carb-Cholecalciferol (CALCIUM-VITAMIN D) 600-400 MG-UNIT TABS Take 1 tablet by mouth 2 (two) times daily.  . cetirizine (ZYRTEC) 10 MG tablet Take 10 mg by mouth daily.  . cholecalciferol (VITAMIN D) 1000 UNITS tablet Take 1,000 Units by mouth  daily.  Marland Kitchen Cod Liver Oil 1000 MG CAPS Take 1 capsule by mouth daily.  Marland Kitchen LORazepam (ATIVAN) 1 MG tablet TAKE 1/2 TABLET BY MOUTH EVERY 8 HOURS AS NEEDED. MAY TAKE AN ADDITIONAL 1/2 TABLET IF NEEDED  . Multiple Vitamin (MULTIVITAMIN) tablet Take 1 tablet by mouth daily.  . Omega-3 Fatty Acids (FISH OIL) 1000 MG CAPS Take 1 capsule by mouth daily.  Marland Kitchen PARoxetine (PAXIL) 40 MG tablet TAKE 1 TABLET (40 MG TOTAL) BY MOUTH EVERY MORNING.  . vitamin E 200 UNIT capsule Take 400 Units by mouth daily.   No current facility-administered medications on file prior to visit.    Review of Systems  Constitutional: Negative for fever, chills, activity change, appetite change, fatigue and unexpected weight change.  HENT: Negative for hearing loss.   Eyes: Negative for visual disturbance.  Respiratory: Negative for cough, chest tightness, shortness of breath and wheezing.   Cardiovascular: Negative for chest pain, palpitations and leg swelling.  Gastrointestinal: Positive for constipation. Negative for nausea, vomiting, abdominal pain, diarrhea, blood in stool and abdominal distention.  Genitourinary: Negative for hematuria and difficulty urinating.  Musculoskeletal: Negative for myalgias, arthralgias and neck pain.  Skin: Negative for rash.  Neurological: Negative for dizziness, seizures, syncope and headaches.  Hematological: Negative for adenopathy. Does not bruise/bleed easily.  Psychiatric/Behavioral: Negative for dysphoric mood. The patient is not nervous/anxious.    Per HPI unless specifically indicated in ROS section     Objective:    BP 120/76 mmHg  Pulse 78  Temp(Src) 98.5 F (36.9 C) (Oral)  Ht 5' 2.75" (1.594 m)  Wt 164 lb (74.39 kg)  BMI 29.28 kg/m2  SpO2 96%  Wt Readings from Last 3 Encounters:  02/07/15 164 lb (74.39 kg)  01/05/15 165 lb 12 oz (75.184 kg)  09/21/14 163 lb 12.8 oz (74.299 kg)    Physical Exam  Constitutional: She is oriented to person, place, and time. She appears  well-developed and well-nourished. No distress.  HENT:  Head: Normocephalic and atraumatic.  Right Ear: Hearing, tympanic membrane, external ear and ear canal normal.  Left Ear: Hearing, tympanic membrane, external ear and ear canal normal.  Nose: Nose normal.  Mouth/Throat: Uvula is midline, oropharynx is clear and moist and mucous membranes are normal. No oropharyngeal exudate, posterior oropharyngeal edema or posterior oropharyngeal erythema.  Eyes: Conjunctivae and EOM are normal. Pupils are equal, round, and reactive to light. No scleral icterus.  Neck: Normal range of motion. Neck supple. Carotid bruit is not present. No thyromegaly present.  Cardiovascular: Normal rate, regular rhythm, normal heart sounds and intact distal pulses.   No murmur heard. Pulses:      Radial pulses are 2+ on the right side, and 2+ on the left side.  Pulmonary/Chest: Effort normal and breath sounds normal. No respiratory distress. She has no wheezes. She has no rales.  Breast exam - does not do at home. Pending mammo  Abdominal: Soft. Bowel sounds are normal. She exhibits no distension and no mass. There is no tenderness. There is no rebound and no guarding.  Genitourinary:  GYN - declined  Musculoskeletal: Normal range of motion. She exhibits no edema.  Lymphadenopathy:    She has no cervical adenopathy.  Neurological: She is alert and oriented to person, place, and time.  CN grossly intact, station and gait intact Recall 3/3 Calculation 4/5 serial 7s  Skin: Skin is warm and dry. No rash noted.  Psychiatric: She has a normal mood and affect. Her behavior is normal. Judgment and thought content normal.  Nursing note and vitals reviewed.  Results for orders placed or performed in visit on 01/31/15  Renal function panel  Result Value Ref Range   Sodium 141 135 - 145 mEq/L   Potassium 3.7 3.5 - 5.1 mEq/L   Chloride 105 96 - 112 mEq/L   CO2 27 19 - 32 mEq/L   Calcium 9.4 8.4 - 10.5 mg/dL   Albumin  4.0 3.5 - 5.2 g/dL   BUN 13 6 - 23 mg/dL   Creatinine, Ser 1.10 0.40 - 1.20 mg/dL   Glucose, Bld 93 70 - 99 mg/dL   Phosphorus 3.3 2.3 - 4.6 mg/dL   GFR 52.21 (L) >60.00 mL/min  LDL Cholesterol, Direct  Result Value Ref Range   Direct LDL 135.0 mg/dL  Vit D  25 hydroxy (rtn osteoporosis monitoring)  Result Value Ref Range   VITD 29.42 (L) 30.00 - 100.00 ng/mL  PTH, Intact and Calcium  Result Value Ref Range   PTH 39 14 - 64 pg/mL   Calcium 9.2 8.4 - 10.5 mg/dL      Assessment & Plan:  Hep C screen next visit Problem List Items Addressed This Visit    Osteopenia    Discussed dietary sources of calcium. Reviewed cal/vit D supplementation.      Medicare annual wellness visit, subsequent - Primary    I have personally reviewed the Medicare Annual Wellness questionnaire and have noted 1. The patient's medical and social history 2. Their use of alcohol, tobacco  or illicit drugs 3. Their current medications and supplements 4. The patient's functional ability including ADL's, fall risks, home safety risks and hearing or visual impairment. Cognitive function has been assessed and addressed as indicated.  5. Diet and physical activity 6. Evidence for depression or mood disorders The patients weight, height, BMI have been recorded in the chart. I have made referrals, counseling and provided education to the patient based on review of the above and I have provided the pt with a written personalized care plan for preventive services. Provider list updated.. See scanned questionairre as needed for further documentation. Reviewed preventative protocols and updated unless pt declined.       HLD (hyperlipidemia)    Reviewed with patient. rec diet changes to improve LDL.      Health maintenance examination    Preventative protocols reviewed and updated unless pt declined. Discussed healthy diet and lifestyle.       CKD (chronic kidney disease) stage 3, GFR 30-59 ml/min    Stable.  Continue to monitor.      Advanced care planning/counseling discussion    Advanced directives: thinks she has at home. Asked to bring me copy. Doesn't want prolonged life support. HCPOA is children but this has not been updated. Packet provided today.      Adjustment disorder with mixed anxiety and depressed mood    Chronic, stable. Continue paxil and lorazepam 1/2 tab bid.          Follow up plan: Return in about 4 months (around 06/07/2015), or as needed, for follow up visit.

## 2015-02-07 NOTE — Assessment & Plan Note (Signed)

## 2015-02-07 NOTE — Assessment & Plan Note (Signed)
Advanced directives: thinks she has at home. Asked to bring me copy. Doesn't want prolonged life support. HCPOA is children but this has not been updated. Packet provided today.

## 2015-02-12 ENCOUNTER — Ambulatory Visit
Admission: RE | Admit: 2015-02-12 | Discharge: 2015-02-12 | Disposition: A | Discharge status: Home / Self Care | Payer: Medicare PPO | Source: Ambulatory Visit

## 2015-02-12 DIAGNOSIS — Z1231 Encounter for screening mammogram for malignant neoplasm of breast: Secondary | ICD-10-CM

## 2015-02-13 LAB — HM MAMMOGRAPHY: HM Mammogram: NORMAL

## 2015-02-15 ENCOUNTER — Encounter: Payer: Self-pay | Admitting: *Deleted

## 2015-02-21 ENCOUNTER — Other Ambulatory Visit: Payer: Self-pay | Admitting: Family Medicine

## 2015-02-21 NOTE — Telephone Encounter (Signed)
Pt left v/m requesting refill lorazepam to [facility]. Last refilled # 60 on 12/25/14. Last annual exam on 02/07/15. Pt does not have enough med to last thru weekend and request refill today. Pt request cb when refilled.

## 2015-02-21 NOTE — Telephone Encounter (Signed)
Ok to refill 

## 2015-02-21 NOTE — Telephone Encounter (Signed)
Rx called in as directed. Pt notified. 

## 2015-02-21 NOTE — Telephone Encounter (Signed)
plz phone in. 

## 2015-03-05 ENCOUNTER — Telehealth: Payer: Self-pay | Admitting: Family Medicine

## 2015-03-05 DIAGNOSIS — H524 Presbyopia: Secondary | ICD-10-CM | POA: Diagnosis not present

## 2015-03-05 DIAGNOSIS — H04123 Dry eye syndrome of bilateral lacrimal glands: Secondary | ICD-10-CM | POA: Diagnosis not present

## 2015-03-05 NOTE — Telephone Encounter (Signed)
Patient was told to stop drinking Pepsi.  Patient wants to know what she can and cannot drink. She's not sure if the [name] was bothering her kidneys or the caffeine.

## 2015-03-05 NOTE — Telephone Encounter (Addendum)
Doesn't need to fully stop caffeine or dark sodas, but recommend limit this.  If wants soda, would suggest start with clear sodas like sprite or sierra mist or ginger ale. Best thing to drink is water, and milk ok for calcium.

## 2015-03-05 NOTE — Telephone Encounter (Signed)
Patient notified as instructed by telephone and verbalized understanding. 

## 2015-04-05 ENCOUNTER — Other Ambulatory Visit: Payer: Medicare PPO

## 2015-04-09 ENCOUNTER — Telehealth: Payer: Self-pay

## 2015-04-09 ENCOUNTER — Other Ambulatory Visit: Payer: Self-pay | Admitting: Family Medicine

## 2015-04-09 NOTE — Telephone Encounter (Signed)
Pt had question about refilling a med but did not leave name of med. Left v/m requesting cb with name of med.

## 2015-04-10 ENCOUNTER — Encounter: Payer: Medicare PPO | Admitting: Family Medicine

## 2015-04-10 NOTE — Telephone Encounter (Signed)
Rx called in as directed.   

## 2015-04-10 NOTE — Telephone Encounter (Signed)
plz phone in. 

## 2015-04-11 NOTE — Telephone Encounter (Signed)
Jaynell left v/m at 4:53 PM; pt read that paroxetine could cause tremors; pts hands are shaking slightly and pt wants to know if could be caused by paroxetine. Please advise. Dr Darnell Level out of office this afternoon.

## 2015-04-11 NOTE — Telephone Encounter (Signed)
Patient notified as instructed by telephone and verbalized understanding.  Patient stated that this has been going on for several months and most people probably are not aware of it, but she can tell when she holds a paper. Offered patient appointment with Dr. Darnell Level, which she declined stating that she will wait and talk with him about this at her appointment in March. Patient stated that if it gets worse before her upcoming appointment she will call and come in earlier.

## 2015-04-11 NOTE — Telephone Encounter (Signed)
It could but if this isn't a new med then it is less likely.  I wouldn't change anything yet.  Likely worth getting OV with PCP for eval.  Thanks.

## 2015-06-07 ENCOUNTER — Ambulatory Visit (INDEPENDENT_AMBULATORY_CARE_PROVIDER_SITE_OTHER): Payer: Medicare Other | Admitting: Family Medicine

## 2015-06-07 ENCOUNTER — Other Ambulatory Visit: Payer: Self-pay | Admitting: Family Medicine

## 2015-06-07 ENCOUNTER — Encounter: Payer: Self-pay | Admitting: Family Medicine

## 2015-06-07 VITALS — BP 128/82 | HR 84 | Temp 98.2°F | Wt 169.5 lb

## 2015-06-07 DIAGNOSIS — F4323 Adjustment disorder with mixed anxiety and depressed mood: Secondary | ICD-10-CM

## 2015-06-07 DIAGNOSIS — R251 Tremor, unspecified: Secondary | ICD-10-CM

## 2015-06-07 MED ORDER — PAROXETINE HCL 30 MG PO TABS: 30.0000 mg | ORAL_TABLET | Freq: Every day | ORAL | Status: DC

## 2015-06-07 MED ORDER — LORAZEPAM 0.5 MG PO TABS: 0.2500 mg | ORAL_TABLET | Freq: Two times a day (BID) | ORAL | Status: DC | PRN

## 2015-06-07 NOTE — Assessment & Plan Note (Signed)
Mild postural tremor noted today - ?med related. Will see how she does with lower doses of SSRI and benzo.

## 2015-06-07 NOTE — Progress Notes (Signed)
BP 128/82 mmHg  Pulse 84  Temp(Src) 98.2 F (36.8 C) (Oral)  Wt 169 lb 8 oz (76.885 kg)   CC: 4 mo f/u visit  Subjective:    Patient ID: Karen [name], female    DOB: 1945/12/10, 69 y.o.   MRN: [id number]  HPI: Karen [name] is a 70 y.o. female presenting on 06/07/2015 for Follow-up   Noticing tremors in hands.   Adjustment disorder - has decreased lorazepam to 1/2 tab bid.   Noticing odor to urine. No other UTI sxs like dysuria, urgency, frequency. Drinking less water than she should.   Relevant past medical, surgical, family and social history reviewed and updated as indicated. Interim medical history since our last visit reviewed. Allergies and medications reviewed and updated. Current Outpatient Prescriptions on File Prior to Visit  Medication Sig  . aspirin EC 81 MG tablet Take 81 mg by mouth every Monday, Wednesday, and Friday.  . Calcium Carb-Cholecalciferol (CALCIUM-VITAMIN D) 600-400 MG-UNIT TABS Take 1 tablet by mouth 2 (two) times daily.  . cetirizine (ZYRTEC) 10 MG tablet Take 10 mg by mouth daily.  . cholecalciferol (VITAMIN D) 1000 UNITS tablet Take 1,000 Units by mouth daily.  Marland Kitchen Cod Liver Oil 1000 MG CAPS Take 1 capsule by mouth daily.  . Multiple Vitamin (MULTIVITAMIN) tablet Take 1 tablet by mouth daily.  . Omega-3 Fatty Acids (FISH OIL) 1000 MG CAPS Take 1 capsule by mouth daily.  . vitamin E 200 UNIT capsule Take 400 Units by mouth daily.   No current facility-administered medications on file prior to visit.    Review of Systems Per HPI unless specifically indicated in ROS section     Objective:    BP 128/82 mmHg  Pulse 84  Temp(Src) 98.2 F (36.8 C) (Oral)  Wt 169 lb 8 oz (76.885 kg)  Wt Readings from Last 3 Encounters:  06/07/15 169 lb 8 oz (76.885 kg)  02/07/15 164 lb (74.39 kg)  01/05/15 165 lb 12 oz (75.184 kg)    Physical Exam  Constitutional: She is oriented to person, place, and time. She appears well-developed and well-nourished. No  distress.  HENT:  Mouth/Throat: No oropharyngeal exudate.  Cardiovascular: Normal rate, regular rhythm, normal heart sounds and intact distal pulses.   No murmur heard. Pulmonary/Chest: Effort normal and breath sounds normal. No respiratory distress. She has no wheezes. She has no rales.  Neurological: She is alert and oriented to person, place, and time.  Mild postural tremor bilateral hands. No resting or intention tremor noted  Skin: Skin is warm and dry. No rash noted.  Psychiatric: She has a normal mood and affect. Her behavior is normal. Thought content normal.  Nursing note and vitals reviewed.  Results for orders placed or performed in visit on 02/15/15  [facility] MAMMOGRAPHY  Result Value Ref Range   [facility] Mammogram Normal Birads 1-Repeat 1 year       Assessment & Plan:   Problem List Items Addressed This Visit    Tremor of both hands    Mild postural tremor noted today - ?med related. Will see how she does with lower doses of SSRI and benzo.      Adjustment disorder with mixed anxiety and depressed mood - Primary    Chronic, stable.  Pt intetested in trial of lower dose - will send in paxil 30mg  daily and decrease lorazepam to 0.25-0.5mg  BID PRN anxiety. Re assess at 4 mo f/u.          Follow up plan:  Return in about 4 months (around 10/07/2015), or as needed, for follow up visit.

## 2015-06-07 NOTE — Assessment & Plan Note (Signed)
Chronic, stable.  Pt intetested in trial of lower dose - will send in paxil 30mg  daily and decrease lorazepam to 0.25-0.5mg  BID PRN anxiety. Re assess at 4 mo f/u.

## 2015-06-07 NOTE — Patient Instructions (Signed)
I think you are doing well. Let's decrease paxil to 30mg  daily - new medicine sent to pharmacy Let's decrease lorazepam to 0.5mg  - may take 1/2 - 1 tablet twice daily as needed.

## 2015-06-07 NOTE — Progress Notes (Signed)
Pre visit review using our clinic review tool, if applicable. No additional management support is needed unless otherwise documented below in the visit note. 

## 2015-06-08 ENCOUNTER — Telehealth: Payer: Self-pay | Admitting: *Deleted

## 2015-06-08 NOTE — Telephone Encounter (Signed)
PLEASE NOTE: All timestamps contained within this report are represented as [place] Standard Time. CONFIDENTIALTY NOTICE: This fax transmission is intended only for the addressee. It contains information that is legally privileged, confidential or otherwise protected from use or disclosure. If you are not the intended recipient, you are strictly prohibited from reviewing, disclosing, copying using or disseminating any of this information or taking any action in reliance on or regarding this information. If you have received this fax in error, please notify us immediately by telephone so that we can arrange for its return to [place]. Phone: [phone], Toll-Free: [phone], Fax: [phone] Page: 1 of 1 Call Id: [id number] [facility] Patient Name: [facility] Haan Gender: Female DOB: 04/07/1945 Age: 70 Y 61 M Return Phone Number: [phone] (Primary), [phone] (Secondary) Address: [place] [address] Client [place] Night - Client Client Site [place] Physician Ria Bush Contact Type Call Who Is Calling Patient / Member / Family / Caregiver Call Type Triage / Clinical Relationship To Patient Self Return Phone Number [phone] (Primary) Chief Complaint Prescription Refill or Medication Request (non symptomatic) Reason for Call Symptomatic / Request for Health Information Initial Comment CBWN: Received another call for the patient, no change in symptoms. Caller states she doesn't have one of her prescriptions. Translation No Nurse Assessment Nurse: Luther Parody, RN, Malachy Mood Date/Time (Eastern Time): 06/07/2015 6:59:40 PM Confirm and document reason for call. If symptomatic, describe symptoms. You must click the next button to save text entered. ---Caller states that she was seen by her pcp today and her md was supposed  to call ativan 0.5 mg to the pharmacy but nothing was called in. She has been taking 1 mg daily and he is wanting her to cut the dose down. She seems to be confused as to whether she is going to be taking 0.5 mg or 0.25 mg. Has the patient traveled out of the country within the last 30 days? ---Not Applicable Does the patient have any new or worsening symptoms? ---No Please document clinical information provided and list any resource used. ---Advised that she should take the dose she has been taking and clarify the dose with the office in the am since I would unable to call in [place]. Verbalized understanding. Guidelines Guideline Title Affirmed Question Affirmed Notes Nurse Date/Time (Eastern Time) Disp. Time [name] Time) Disposition Final User 06/07/2015 6:54:27 PM Send To Call Back Waiting For Nurse Julien Girt 06/07/2015 7:14:02 PM Clinical Call Yes Luther Parody, RN, Malachy Mood

## 2015-06-08 NOTE — Telephone Encounter (Signed)
Printed prescription was given to patient at [facility] yesterday 3/9.  Patient to take 1/2 - 1 tablet (0.25 - 0.5 mg) twice daily as needed for anxiety.  Left message for patient to return call.

## 2015-06-08 NOTE — Telephone Encounter (Signed)
Patient returned call.  She confirms she does have the hard copy of the prescription and she will take that to the pharmacy today.  Directions confirmed with patient.

## 2015-06-11 ENCOUNTER — Telehealth: Payer: Self-pay

## 2015-06-11 NOTE — Telephone Encounter (Signed)
Would suggest she take lorazepam 0.5mg  1/2 tab BID instead of 1 tab daily as recommended by [facility].

## 2015-06-11 NOTE — Telephone Encounter (Signed)
Patient notified and verbalized understanding. 

## 2015-06-11 NOTE — Telephone Encounter (Signed)
PLEASE NOTE: All timestamps contained within this report are represented as [place] Standard Time. CONFIDENTIALTY NOTICE: This fax transmission is intended only for the addressee. It contains information that is legally privileged, confidential or otherwise protected from use or disclosure. If you are not the intended recipient, you are strictly prohibited from reviewing, disclosing, copying using or disseminating any of this information or taking any action in reliance on or regarding this information. If you have received this fax in error, please notify us immediately by telephone so that we can arrange for its return to [place]. Phone: [phone], Toll-Free: [phone], Fax: [phone] Page: 1 of 1 Call Id: [id number] [place] Patient Name: Karen [name] Gender: Female DOB: [birth year] Age: 70 Y 59 M Return Phone Number: [phone] (Primary), [phone] (Secondary) Address: [place] [address] Client [place] Night - Client Client Site [place] Physician Ria Bush Contact Type Call Who Is Calling Patient / Member / Family / Caregiver Call Type Triage / Clinical Relationship To Patient Self Return Phone Number [phone] (Primary) Chief Complaint Medication Question (non symptomatic) Reason for Call Medication Question Initial Comment Taking Lorazepam, dosage questions Translation No Nurse Assessment Nurse: Wynetta Emery, RN, Baker Janus Date/Time (Eastern Time): 06/08/2015 6:10:30 PM Confirm and document reason for call. If symptomatic, describe symptoms. You must click the next button to save text entered. ---Karen [name] is on lorazapam since husband was killed she was taking 1mg  po 1/2 tab every 8 hours take 1/2 initial tab as needed. seen MD and he wrote a RX fro 0.5mg  one tablet BID -- she is taking that amount now  and does not want to do that she wants to lower dose. what to do Has the patient traveled out of the country within the last 30 days? ---Not Applicable Does the patient have any new or worsening symptoms? ---No Please document clinical information provided and list any resource used. ---Nurse advised her if she wants to decrease it just take one tablet a day possibly at night she wants to get off of it. Guidelines Guideline Title Affirmed Question Affirmed Notes Nurse Date/Time (Eastern Time) Disp. Time [name] Time) Disposition Final User 06/08/2015 6:25:08 PM Clinical Call Yes Wynetta Emery, RN, Baker Janus

## 2015-06-12 ENCOUNTER — Telehealth: Payer: Self-pay | Admitting: Family Medicine

## 2015-06-12 NOTE — Telephone Encounter (Signed)
Patient would like to go back to the half a pill in the morning and a half a pill at night for her anxiety medication.  Please advise.

## 2015-06-12 NOTE — Telephone Encounter (Signed)
I told patient yesterday to do this.

## 2015-06-12 NOTE — Telephone Encounter (Signed)
Spoke with patient and told her again. She said she was "running around yesterday" and didn't remember talking to me (even though we talked for well over 5 minutes).

## 2015-07-23 ENCOUNTER — Other Ambulatory Visit: Payer: Self-pay | Admitting: Family Medicine

## 2015-07-23 NOTE — Telephone Encounter (Signed)
plz phone in. 

## 2015-07-23 NOTE — Telephone Encounter (Signed)
Ok to refill 

## 2015-07-24 ENCOUNTER — Other Ambulatory Visit: Payer: Self-pay | Admitting: Family Medicine

## 2015-07-24 NOTE — Telephone Encounter (Signed)
Lorazepam called into [facility].

## 2015-07-30 ENCOUNTER — Ambulatory Visit: Payer: Medicare Other | Admitting: Internal Medicine

## 2015-07-30 ENCOUNTER — Telehealth: Payer: Self-pay

## 2015-07-30 NOTE — Telephone Encounter (Signed)
Dr Darnell Level said amoxicillin 875 mg bid is appropriate; pt does feel better since started abx; pt will monitor condition and if no significant improvement after 2 days pt will cb to be seen. Pt is comfortable with that and will cb if needed.

## 2015-07-30 NOTE — Telephone Encounter (Addendum)
[facility];given amoxicillin 875 mg to take bid for 10 days.pt has prod cough with dark brown phlegm, head and chest congestion.No fever.pt not hurting in chest now when breathes. Pt scheduled appt with Avie Echevaria NP at 1:15 today in case Dr Darnell Level thinks pt needs to be rechecked. Pt is not sure if should be seen or not and wants cb prior to 1:15 appt with Dr Synthia Innocent opinion.

## 2015-07-30 NOTE — Telephone Encounter (Signed)
PLEASE NOTE: All timestamps contained within this report are represented as [place] Standard Time. CONFIDENTIALTY NOTICE: This fax transmission is intended only for the addressee. It contains information that is legally privileged, confidential or otherwise protected from use or disclosure. If you are not the intended recipient, you are strictly prohibited from reviewing, disclosing, copying using or disseminating any of this information or taking any action in reliance on or regarding this information. If you have received this fax in error, please notify us immediately by telephone so that we can arrange for its return to [place]. Phone: [phone], Toll-Free: [phone], Fax: [phone] Page: 1 of 2 Call Id: [id number] Fowler Patient Name: [facility] Gender: Female DOB: [birth year] Age: 70 Y 60 M 19 D Return Phone Number: [phone] (Primary), [phone] (Secondary) Address: [place] [address] Client [place] Night - Client Client Site [place] Physician Ria Bush - MD Contact Type Call Who Is Calling Patient / Member / Family / Caregiver Call Type Triage / Clinical Relationship To Patient Self Return Phone Number [phone] (Primary) Chief Complaint Chest Pain (non urgent symptoms) Reason for Call Symptomatic / Request for Health Information Initial Comment caller states she is very congested (chest) - has yellow phelgm- chest hurts when she coughs PreDisposition Home Care Translation No Nurse Assessment Nurse: Mills-Hernandez, RN, Izora Gala Date/Time (Eastern Time): 07/28/2015 12:25:55 PM Confirm and document reason for call. If symptomatic, describe symptoms. You must click the next button to save text entered. ---caller states she is very congested (chest) - has brown phelgmchest hurts when she  coughs. Has the patient traveled out of the country within the last 30 days? ---No Does the patient have any new or worsening symptoms? ---Yes Will a triage be completed? ---Yes Related visit to physician within the last 2 weeks? ---No Does the PT have any chronic conditions? (i.e. diabetes, asthma, etc.) ---No Is this a behavioral health or substance abuse call? ---No Guidelines Guideline Title Affirmed Question Affirmed Notes Nurse Date/Time (Eastern Time) Cough - Acute Productive Chest pain (Exception: MILD central chest pain, present only when coughing) Mills-Hernandez, RN, Izora Gala 07/28/2015 12:28:27 PM Disp. Time [name] Time) Disposition Final User 07/28/2015 12:32:11 PM Go to ED Now Yes Mills-Hernandez, RN, Cindee Salt Understands: Yes PLEASE NOTE: All timestamps contained within this report are represented as [place] Standard Time. CONFIDENTIALTY NOTICE: This fax transmission is intended only for the addressee. It contains information that is legally privileged, confidential or otherwise protected from use or disclosure. If you are not the intended recipient, you are strictly prohibited from reviewing, disclosing, copying using or disseminating any of this information or taking any action in reliance on or regarding this information. If you have received this fax in error, please notify us immediately by telephone so that we can arrange for its return to [place]. Phone: [phone], Toll-Free: [phone], Fax: [phone] Page: 2 of 2 Call Id: [id number] Disagree/Comply: Disagree Disagree/Comply Reason: Disagree with instructions Care Advice Given Per Guideline GO TO ED NOW: You need to be seen in the Emergency Department. Go to the ER at ___________ Page now. Drive carefully. DRIVING: Another adult should drive. BRING MEDICINES: * Please bring a list of your current medicines when you go to the Emergency Department (ER). * It is also a good idea to bring the pill  bottles too. This will help the doctor to make certain you are taking  the right medicines and the right dose. * Severe difficulty breathing occurs * Lips or face turns blue * Passes out or becomes confused. CARE ADVICE given per Cough - Acute Productive (Adult) guideline. Referrals GO TO FACILITY REFUSED

## 2015-08-20 ENCOUNTER — Other Ambulatory Visit: Payer: Self-pay | Admitting: Family Medicine

## 2015-08-20 NOTE — Telephone Encounter (Signed)
plz phone in. 

## 2015-08-20 NOTE — Telephone Encounter (Signed)
Last filled 07-24-15 #50 Last OV 06-07-15 Next OV 10-08-15

## 2015-08-21 NOTE — Telephone Encounter (Signed)
This was phoned in this morning.

## 2015-08-21 NOTE — Telephone Encounter (Signed)
Patient called.  Patient is out of her medication.  Please call patient when rx is called in.  Patient can be reached at [phone]. Patient took 1 pill yesterday.  Patient said she did well yesterday on 1 pill.  Patient said she'd like to cut back to 1 pill.

## 2015-08-21 NOTE — Telephone Encounter (Signed)
Rx called in as directed.   

## 2015-08-30 ENCOUNTER — Telehealth: Payer: Self-pay

## 2015-08-30 NOTE — Telephone Encounter (Signed)
Pt left v/m wanting Dr Danise Mina opinion about preventative measure for arteries called Life line screening. Life Line screening is online if need more info.

## 2015-08-30 NOTE — Telephone Encounter (Signed)
Won't harm her to undergo screening but I don't think it's worth the $ - I think it's $100+ and if something abnormal is found I would probably want to repeat with better imaging study.

## 2015-08-31 NOTE — Telephone Encounter (Signed)
Message left advising patient.  

## 2015-09-17 ENCOUNTER — Telehealth: Payer: Self-pay

## 2015-09-17 NOTE — Telephone Encounter (Signed)
Recommend evaluation in office for worsening cough despite antibiotics. Bring in her antibiotics she is currently taking.  Unfortunately I'm out of office rest of week, please see other available provider.  Thanks.

## 2015-09-17 NOTE — Telephone Encounter (Signed)
PLEASE NOTE: All timestamps contained within this report are represented as [place] Standard Time. CONFIDENTIALTY NOTICE: This fax transmission is intended only for the addressee. It contains information that is legally privileged, confidential or otherwise protected from use or disclosure. If you are not the intended recipient, you are strictly prohibited from reviewing, disclosing, copying using or disseminating any of this information or taking any action in reliance on or regarding this information. If you have received this fax in error, please notify us immediately by telephone so that we can arrange for its return to [place]. Phone: [phone], Toll-Free: [phone], Fax: [phone] Page: 1 of 3 Call Id: [id number] Ransom Patient Name: [facility] Gender: Female DOB: Sep 26, 1945 Age: 70 Y 40 M 8 D Return Phone Number: [phone] (Primary), [phone] (Secondary) Address: [place] [address] Client [facility] [place] Night - Client Client Site [place] Physician Ria Bush - MD Contact Type Call Who Is Calling Patient / Member / Family / Caregiver Call Type Triage / Clinical Relationship To Patient Self Return Phone Number [phone] (Primary) Chief Complaint CHEST PAIN (>=21 years) - pain, pressure, heaviness or tightness Reason for Call Symptomatic / Request for [place] states her chest hurts when she coughs. She had some green mucus. She is very congested, hurts when she coughs. PreDisposition Call Doctor Translation No Nurse Assessment Nurse: Malva Cogan, RN, Juliann Pulse Date/Time [name] Time): 09/16/2015 12:36:12 PM Confirm and document reason for call. If symptomatic, describe symptoms. You must click the next button to save text entered. ---Caller states that she had onset  of coughing yesterday after she mowed her yard, reports that her sputum is yellowish-green in color. Had onset of chest pain just related to coughing today. No fever. Has the patient traveled out of the country within the last 30 days? ---No Does the patient have any new or worsening symptoms? ---Yes Will a triage be completed? ---Yes Related visit to physician within the last 2 weeks? ---No Does the PT have any chronic conditions? (i.e. diabetes, asthma, etc.) ---Yes List chronic conditions. ---anxiety, depression Is this a behavioral health or substance abuse call? ---No Guidelines Guideline Title Affirmed Question Affirmed Notes Nurse Date/Time [name] Time) Chest Pain Chest pain(s) lasting a few seconds from coughing (all triage questions negative) Malva Cogan, RN, Juliann Pulse 09/16/2015 12:39:05 PM Disp. Time [name] Time) Disposition Final User 09/16/2015 12:31:55 PM Send to Urgent Bonita Quin, Melissa PLEASE NOTE: All timestamps contained within this report are represented as [place] Standard Time. CONFIDENTIALTY NOTICE: This fax transmission is intended only for the addressee. It contains information that is legally privileged, confidential or otherwise protected from use or disclosure. If you are not the intended recipient, you are strictly prohibited from reviewing, disclosing, copying using or disseminating any of this information or taking any action in reliance on or regarding this information. If you have received this fax in error, please notify us immediately by telephone so that we can arrange for its return to [place]. Phone: [phone], Toll-Free: [phone], Fax: [phone] Page: 2 of 3 Call Id: [id number] 09/16/2015 12:50:33 PM Home Care Yes Malva Cogan, RN, Gara Kroner Understands: Yes Disagree/Comply: Comply Care Advice Given Per Guideline HOME CARE: You should be able to treat this at home. REASSURANCE: Chest pains that occur with coughing come from the chest wall and  from irritation of the airways. They are usually not serious. PAIN MEDICINES: *  For pain relief, take acetaminophen, ibuprofen, or naproxen. * Use the lowest amount that makes your pain feel better. ACETAMINOPHEN (E.G., TYLENOL): * Take 650 mg (two 325 mg pills) by mouth every 4-6 hours as needed. Each Regular Strength Tylenol pill has 325 mg of acetaminophen. The most you should take each day is 3,250 mg (10 Regular Strength pills a day). * Another choice is to take 1,000 mg (two 500 mg pills) every 8 hours as needed. Each Extra Strength Tylenol pill has 500 mg of acetaminophen. The most you should take each day is 3,000 mg (6 Extra Strength pills a day). EXTRA NOTES: * Acetaminophen is thought to be safer than ibuprofen or naproxen for people over 67 years old. Acetaminophen is in many OTC and prescription medicines. It might be in more than one medicine that you are taking. You need to be careful and not take an overdose. An acetaminophen overdose can hurt the liver. * Before taking any medicine, read all the instructions on the package. COUGH MEDICINES: * OTC COUGH SYRUPS: The most common cough suppressant in OTC cough medications is dextromethorphan. Often the letters 'DM' appear in the name. * OTC COUGH DROPS: Cough drops can help a lot, especially for mild coughs. They reduce coughing by soothing your irritated throat and removing that tickle sensation in the back of the throat. Cough drops also have the advantage of portability - you can carry them with you. * HOME REMEDY - HARD CANDY: Hard candy works just as well as medicine-flavored OTC cough drops. Diabetics should use sugar-free candy. * HOME REMEDY - HONEY: This old home remedy has been shown to help decrease coughing at night. The adult dosage is 2 teaspoons (10 ml) at bedtime. Honey should not be given to infants under one year of age. OTC COUGH SYRUP - DEXTROMETHORPHAN: * Cough syrups containing the cough suppressant dextromethorphan  (DM) may help decrease your cough. Cough syrups work best for coughs that keep you awake at night. They can also sometimes help in the late stages of a respiratory infection when the cough is dry and hacking. They can be used along with cough drops. * Examples: Benylin, Robitussin DM, Vicks 44 Cough Relief * Read the package instructions for dosage, contraindications, and other important information. HUMIDIFIER: If the air is dry, use a humidifier in the bedroom. (Reason: dry air makes coughs worse) AVOID TOBACCO SMOKE: Smoking or being exposed to smoke makes coughs much worse. EXPECTED COURSE: You should see a doctor within 1 week if there is no improvement in this pain. CALL BACK IF: * Chest pain increases in frequency, duration or severity * Chest pain lasts over 5 minutes * Chest pains persist over 3 days * Difficulty breathing or unusual sweating occurs * Fever over 100.5 F (38.1 C) * You become worse. CARE ADVICE given per Chest Pain (Adult) guideline. Comments User: Olena Mater, RN Date/Time [name] Time): 09/16/2015 12:39:18 PM speaking in complete sentences now User: Olena Mater, RN Date/Time [name] Time): 09/16/2015 12:39:47 PM < 5" & occurs only when coughing User: Olena Mater, RN Date/Time [name] Time): 09/16/2015 12:42:38 PM just lasts a few seconds User: Olena Mater, RN Date/Time (Eastern Time): 09/16/2015 12:51:07 PM Caller talks very quickly & hard to control call because of this. User: Olena Mater, RN Date/Time [name] Time): 09/16/2015 12:52:32 PM Wanted to know if she could take some Augment that was prescribed for her end of April when she was seen in [facility] for similar symptoms & was pharmacy  made error in amount of med that was dispensed. Advised that she needs to ask her PCP about this tomorrow when office is open. PLEASE NOTE: All timestamps contained within this report are represented as [place] Standard Time. CONFIDENTIALTY NOTICE: This fax transmission  is intended only for the addressee. It contains information that is legally privileged, confidential or otherwise protected from use or disclosure. If you are not the intended recipient, you are strictly prohibited from reviewing, disclosing, copying using or disseminating any of this information or taking any action in reliance on or regarding this information. If you have received this fax in error, please notify us immediately by telephone so that we can arrange for its return to [place]. Phone: [phone], Toll-Free: [phone], Fax: [phone] Page: 3 of 3 Call Id: [id number]

## 2015-09-17 NOTE — Telephone Encounter (Addendum)
Pt left v/m; pt has amoxicillin 875 mg left from when seen from UC earlier in the year. Pt has 8 pills left; pt has already started taking the abx and has 2 days left of med. Pt now coughing up green phlegm and ran fever, 100.1 on 09/16/15; top of chest hurts when pt coughs. Pt wants to see Dr Darnell Level or see what Dr Darnell Level says pt should do.Please advise. [facility].

## 2015-09-17 NOTE — Telephone Encounter (Signed)
Message left advising patient to call and schedule appt.  

## 2015-09-18 NOTE — Telephone Encounter (Addendum)
Pt left v/m; pt wants to take amoxicillin that pt presently has; no fever today but some congestion with cough;pt does not want to schedule appt at this time. Pt request cb that this is OK. DR G out of office and not have access to computer.Please advise.

## 2015-09-18 NOTE — Telephone Encounter (Signed)
I agree with PCP, she needs evaluation.

## 2015-09-19 ENCOUNTER — Other Ambulatory Visit: Payer: Self-pay | Admitting: Family Medicine

## 2015-09-19 NOTE — Telephone Encounter (Signed)
Px written for call in   

## 2015-09-19 NOTE — Telephone Encounter (Signed)
Ok to refill in Dr. Synthia Innocent absence? Last filled 08/20/15 #50 0RF

## 2015-09-19 NOTE — Telephone Encounter (Signed)
Phoned in.

## 2015-09-19 NOTE — Telephone Encounter (Signed)
Left message on voicemail.

## 2015-09-20 ENCOUNTER — Ambulatory Visit (INDEPENDENT_AMBULATORY_CARE_PROVIDER_SITE_OTHER): Payer: Medicare Other | Admitting: Primary Care

## 2015-09-20 ENCOUNTER — Encounter: Payer: Self-pay | Admitting: Primary Care

## 2015-09-20 VITALS — BP 128/84 | HR 80 | Temp 98.2°F | Ht 62.75 in | Wt 166.0 lb

## 2015-09-20 DIAGNOSIS — R05 Cough: Secondary | ICD-10-CM | POA: Diagnosis not present

## 2015-09-20 DIAGNOSIS — R059 Cough, unspecified: Secondary | ICD-10-CM

## 2015-09-20 MED ORDER — AZITHROMYCIN 250 MG PO TABS: ORAL_TABLET | ORAL | Status: DC

## 2015-09-20 NOTE — Progress Notes (Signed)
Pre visit review using our clinic review tool, if applicable. No additional management support is needed unless otherwise documented below in the visit note. 
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
## 2015-09-20 NOTE — Patient Instructions (Signed)
Start Azithromycin antibiotics. Take 2 tablets by mouth today, then 1 tablet daily for 4 additional days.  Cough/Congestion: Try taking Mucinex DM. This will help loosen up the mucous in your chest. Ensure you take this medication with a full glass of water.  Your cough may linger after completion of antibiotics, but you should be feeling better overall.  It was a pleasure meeting you!

## 2015-10-08 ENCOUNTER — Encounter: Payer: Self-pay | Admitting: Family Medicine

## 2015-10-08 ENCOUNTER — Ambulatory Visit (INDEPENDENT_AMBULATORY_CARE_PROVIDER_SITE_OTHER): Payer: Medicare Other | Admitting: Family Medicine

## 2015-10-08 VITALS — BP 122/70 | HR 72 | Temp 98.2°F | Ht 63.0 in | Wt 169.2 lb

## 2015-10-08 DIAGNOSIS — F4323 Adjustment disorder with mixed anxiety and depressed mood: Secondary | ICD-10-CM

## 2015-10-08 MED ORDER — PAROXETINE HCL 20 MG PO TABS: 20.0000 mg | ORAL_TABLET | Freq: Every day | ORAL | Status: DC

## 2015-10-08 NOTE — Assessment & Plan Note (Signed)
Chronic, stable. Ok to continue slow taper of paxil - decrease to 20mg  daily. Discussed continued lorazepam PRN, last filled #50 last month.

## 2015-10-08 NOTE — Patient Instructions (Addendum)
I think it's ok to decrease paxil to 20mg  daily - new dose sent to pharmacy.  Continue 1/2 tablet lorazepam as needed anxiety.  Return in 4 months for physical.

## 2015-10-08 NOTE — Progress Notes (Signed)
Pre visit review using our clinic review tool, if applicable. No additional management support is needed unless otherwise documented below in the visit note. 

## 2015-10-08 NOTE — Progress Notes (Signed)
BP 122/70 mmHg  Pulse 72  Temp(Src) 98.2 F (36.8 C) (Oral)  Ht 5\' 3"  (1.6 m)  Wt 169 lb 4 oz (76.771 kg)  BMI 29.99 kg/m2   CC: 4 mo f/u visit  Subjective:    Patient ID: Karen [name], female    DOB: 11-12-1945, 70 y.o.   MRN: [id number]  HPI: Thai Gebre is a 70 y.o. female presenting on 10/08/2015 for Follow-up   Recent URI treat with amox (didn't finish course) then zpack for recurrent sxs. Persistent intermittent cough that is slowly resolving.   Adjustment disorder - currently on paxil 30mg  daily and ativan 0.5mg  1/2 tab BID PRN anxiety. Doing well. paxil was decreased from 40mg  last visit. Asks about continued decrease.   Upcoming trip to [place].  Enjoys doodlebug mini dauschund.  Looking forward to scupadine harvest.   Relevant past medical, surgical, family and social history reviewed and updated as indicated. Interim medical history since our last visit reviewed. Allergies and medications reviewed and updated. Current Outpatient Prescriptions on File Prior to Visit  Medication Sig  . aspirin EC 81 MG tablet Take 81 mg by mouth every Monday, Wednesday, and Friday.  . Calcium Carb-Cholecalciferol (CALCIUM-VITAMIN D) 600-400 MG-UNIT TABS Take 1 tablet by mouth 2 (two) times daily.  . cholecalciferol (VITAMIN D) 1000 UNITS tablet Take 1,000 Units by mouth daily.  Marland Kitchen Cod Liver Oil 1000 MG CAPS Take 1 capsule by mouth daily.  Marland Kitchen LORazepam (ATIVAN) 0.5 MG tablet TAKE 1 TABLET BY MOUTH TWICE A DAY AS NEEDED FOR ANXIETY  . Multiple Vitamin (MULTIVITAMIN) tablet Take 1 tablet by mouth daily.  . Omega-3 Fatty Acids (FISH OIL) 1000 MG CAPS Take 1 capsule by mouth daily.  . vitamin E 200 UNIT capsule Take 400 Units by mouth daily.   No current facility-administered medications on file prior to visit.    Review of Systems Per HPI unless specifically indicated in ROS section     Objective:    BP 122/70 mmHg  Pulse 72  Temp(Src) 98.2 F (36.8 C) (Oral)  Ht 5\' 3"  (1.6 m)   Wt 169 lb 4 oz (76.771 kg)  BMI 29.99 kg/m2  Wt Readings from Last 3 Encounters:  10/08/15 169 lb 4 oz (76.771 kg)  09/20/15 166 lb (75.297 kg)  06/07/15 169 lb 8 oz (76.885 kg)    Physical Exam  Constitutional: She appears well-developed and well-nourished. No distress.  HENT:  Mouth/Throat: Oropharynx is clear and moist. No oropharyngeal exudate.  Cardiovascular: Normal rate, regular rhythm, normal heart sounds and intact distal pulses.   No murmur heard. Pulmonary/Chest: Effort normal and breath sounds normal. No respiratory distress. She has no wheezes. She has no rales.  Musculoskeletal: She exhibits no edema.  Skin: Skin is warm and dry. No rash noted.  Psychiatric: She has a normal mood and affect. Her behavior is normal. Judgment and thought content normal.  Nursing note and vitals reviewed.  Results for orders placed or performed in visit on 02/15/15  HM MAMMOGRAPHY  Result Value Ref Range   HM Mammogram Normal Birads 1-Repeat 1 year       Assessment & Plan:   Problem List Items Addressed This Visit    Adjustment disorder with mixed anxiety and depressed mood - Primary    Chronic, stable. Ok to continue slow taper of paxil - decrease to 20mg  daily. Discussed continued lorazepam PRN, last filled #50 last month.           Follow up plan:  Return in about 4 months (around 02/08/2016), or as needed, for annual exam, prior fasting for blood work.  Ria Bush, MD

## 2015-10-13 ENCOUNTER — Telehealth: Payer: Self-pay

## 2015-10-13 NOTE — Telephone Encounter (Signed)
Patient is on the list for [employer] 2017 and may be a good candidate for an AWV in 2017. Please let me know if/when appt is scheduled.   

## 2015-10-18 ENCOUNTER — Other Ambulatory Visit: Payer: Self-pay | Admitting: Family Medicine

## 2015-10-19 NOTE — Telephone Encounter (Signed)
Rx called in as directed.   

## 2015-10-19 NOTE — Telephone Encounter (Signed)
Ok to refill 

## 2015-10-19 NOTE — Telephone Encounter (Signed)
plz phone in. 

## 2015-10-19 NOTE — Telephone Encounter (Signed)
Pt left v/m requesting refill lorazepam to [facility]; pt will be out of med on 10/21/15 and request refill done today. Last refilled # 50 on 09/19/15. Pt last seen 10/08/15.

## 2015-10-24 ENCOUNTER — Ambulatory Visit (INDEPENDENT_AMBULATORY_CARE_PROVIDER_SITE_OTHER): Payer: Medicare Other | Admitting: Family Medicine

## 2015-10-24 ENCOUNTER — Encounter: Payer: Self-pay | Admitting: Family Medicine

## 2015-10-24 VITALS — BP 114/76 | HR 88 | Temp 98.5°F | Wt 166.5 lb

## 2015-10-24 DIAGNOSIS — F4323 Adjustment disorder with mixed anxiety and depressed mood: Secondary | ICD-10-CM | POA: Diagnosis not present

## 2015-10-24 DIAGNOSIS — K649 Unspecified hemorrhoids: Secondary | ICD-10-CM | POA: Insufficient documentation

## 2015-10-24 NOTE — Assessment & Plan Note (Signed)
Pt states currently not bothersome so rectal exam not performed. Discussed measures to control symptoms including but not limited to importance of fiber and water in det as well as avoiding straining and prolonged time on commode.  Pt declines anusol HC.

## 2015-10-24 NOTE — Progress Notes (Signed)
   BP 114/76   Pulse 88   Temp 98.5 F (36.9 C) (Oral)   Wt 166 lb 8 oz (75.5 kg)   BMI 29.49 kg/m    CC: hemorrhoid Subjective:    Patient ID: Karen [name], female    DOB: [birth year], 70 y.o.   MRN: [id number]  HPI: Karen [name] is a 70 y.o. female presenting on 10/24/2015 for Hemorrhoids   Chronic constipation. She does take fiber supplement some. She also gets good amount of water daily.   Intermittent trouble with hemorrhoids. Some blood with wiping. She was having hemorrhoid flare over last several days but today symptoms have resolved.  Relevant past medical, surgical, family and social history reviewed and updated as indicated. Interim medical history since our last visit reviewed. Allergies and medications reviewed and updated. Current Outpatient Prescriptions on File Prior to Visit  Medication Sig  . aspirin EC 81 MG tablet Take 81 mg by mouth every Monday, Wednesday, and Friday.  . Calcium Carb-Cholecalciferol (CALCIUM-VITAMIN D) 600-400 MG-UNIT TABS Take 1 tablet by mouth 2 (two) times daily.  . cholecalciferol (VITAMIN D) 1000 UNITS tablet Take 1,000 Units by mouth daily.  Marland Kitchen Cod Liver Oil 1000 MG CAPS Take 1 capsule by mouth daily.  Marland Kitchen LORazepam (ATIVAN) 0.5 MG tablet TAKE 1 TABLET BY MOUTH TWICE A DAY AS NEEDED FOR ANXIETY  . Multiple Vitamin (MULTIVITAMIN) tablet Take 1 tablet by mouth daily.  . Omega-3 Fatty Acids (FISH OIL) 1000 MG CAPS Take 1 capsule by mouth daily.  Marland Kitchen PARoxetine (PAXIL) 20 MG tablet Take 1 tablet (20 mg total) by mouth daily.  . vitamin E 200 UNIT capsule Take 400 Units by mouth daily.   No current facility-administered medications on file prior to visit.     Review of Systems Per HPI unless specifically indicated in ROS section     Objective:    BP 114/76   Pulse 88   Temp 98.5 F (36.9 C) (Oral)   Wt 166 lb 8 oz (75.5 kg)   BMI 29.49 kg/m   Wt Readings from Last 3 Encounters:  10/24/15 166 lb 8 oz (75.5 kg)  10/08/15 169 lb 4 oz  (76.8 kg)  09/20/15 166 lb (75.3 kg)   Physical Exam  Constitutional: She appears well-developed and well-nourished. No distress.  Nursing note and vitals reviewed.      Assessment & Plan:   Problem List Items Addressed This Visit    Adjustment disorder with mixed anxiety and depressed mood    Continue paxil 20mg  daily Trial lower ativan dose to 0.25mg  BID (1/2 tab of 0.5mg  dose).       Hemorrhoid - Primary    Pt states currently not bothersome so rectal exam not performed. Discussed measures to control symptoms including but not limited to importance of fiber and water in det as well as avoiding straining and prolonged time on commode.  Pt declines anusol HC.        Other Visit Diagnoses   None.      Follow up plan: Return if symptoms worsen or fail to improve.  Ria Bush, MD

## 2015-10-24 NOTE — Assessment & Plan Note (Signed)
Continue paxil 20mg  daily Trial lower ativan dose to 0.25mg  BID (1/2 tab of 0.5mg  dose).

## 2015-10-24 NOTE — Patient Instructions (Addendum)
Try to back down on lorazepam to 1/2 tablet in the morning and 1/2 tablet at night. Let me know how you do with this.  Regularly take your fiber supplement.  Keep drinking plenty of water.   Hemorrhoids Hemorrhoids are swollen veins around the rectum or anus. There are two types of hemorrhoids:   Internal hemorrhoids. These occur in the veins just inside the rectum. They may poke through to the outside and become irritated and painful.  External hemorrhoids. These occur in the veins outside the anus and can be felt as a painful swelling or hard lump near the anus. CAUSES  Pregnancy.   Obesity.   Constipation or diarrhea.   Straining to have a bowel movement.   Sitting for long periods on the toilet.  Heavy lifting or other activity that caused you to strain.  Anal intercourse. SYMPTOMS   Pain.   Anal itching or irritation.   Rectal bleeding.   Fecal leakage.   Anal swelling.   One or more lumps around the anus.  DIAGNOSIS  Your caregiver may be able to diagnose hemorrhoids by visual examination. Other examinations or tests that may be performed include:   Examination of the rectal area with a gloved hand (digital rectal exam).   Examination of anal canal using a small tube (scope).   A blood test if you have lost a significant amount of blood.  A test to look inside the colon (sigmoidoscopy or colonoscopy). TREATMENT Most hemorrhoids can be treated at home. However, if symptoms do not seem to be getting better or if you have a lot of rectal bleeding, your caregiver may perform a procedure to help make the hemorrhoids get smaller or remove them completely. Possible treatments include:   Placing a rubber band at the base of the hemorrhoid to cut off the circulation (rubber band ligation).   Injecting a chemical to shrink the hemorrhoid (sclerotherapy).   Using a tool to burn the hemorrhoid (infrared light therapy).   Surgically removing the  hemorrhoid (hemorrhoidectomy).   Stapling the hemorrhoid to block blood flow to the tissue (hemorrhoid stapling).  HOME CARE INSTRUCTIONS   Eat foods with fiber, such as whole grains, beans, nuts, fruits, and vegetables. Ask your doctor about taking products with added fiber in them (fibersupplements).  Increase fluid intake. Drink enough water and fluids to keep your urine clear or pale yellow.   Exercise regularly.   Go to the bathroom when you have the urge to have a bowel movement. Do not wait.   Avoid straining to have bowel movements.   Keep the anal area dry and clean. Use wet toilet paper or moist towelettes after a bowel movement.   Medicated creams and suppositories may be used or applied as directed.   Only take over-the-counter or prescription medicines as directed by your caregiver.   Take warm sitz baths for 15-20 minutes, 3-4 times a day to ease pain and discomfort.   Place ice packs on the hemorrhoids if they are tender and swollen. Using ice packs between sitz baths may be helpful.   Put ice in a plastic bag.   Place a towel between your skin and the bag.   Leave the ice on for 15-20 minutes, 3-4 times a day.   Do not use a donut-shaped pillow or sit on the toilet for long periods. This increases blood pooling and pain.  SEEK MEDICAL CARE IF:  You have increasing pain and swelling that is not controlled by treatment  or medicine.  You have uncontrolled bleeding.  You have difficulty or you are unable to have a bowel movement.  You have pain or inflammation outside the area of the hemorrhoids. MAKE SURE YOU:  Understand these instructions.  Will watch your condition.  Will get help right away if you are not doing well or get worse.   This information is not intended to replace advice given to you by your health care provider. Make sure you discuss any questions you have with your health care provider.   Document Released: 03/14/2000  Document Revised: 03/03/2012 Document Reviewed: 01/20/2012 Elsevier Interactive Patient Education [employer].

## 2015-11-22 ENCOUNTER — Other Ambulatory Visit: Payer: Self-pay | Admitting: Family Medicine

## 2015-11-22 NOTE — Telephone Encounter (Signed)
Rx called in as directed.   

## 2015-11-22 NOTE — Telephone Encounter (Signed)
plz phone in. 

## 2015-11-22 NOTE — Telephone Encounter (Signed)
Ok to refill? Last filled 10/19/15 #50 0RF

## 2015-11-30 ENCOUNTER — Other Ambulatory Visit: Payer: Self-pay | Admitting: Family Medicine

## 2015-12-04 ENCOUNTER — Encounter: Payer: Self-pay | Admitting: Internal Medicine

## 2015-12-04 ENCOUNTER — Ambulatory Visit (INDEPENDENT_AMBULATORY_CARE_PROVIDER_SITE_OTHER): Payer: Medicare Other | Admitting: Internal Medicine

## 2015-12-04 ENCOUNTER — Telehealth: Payer: Self-pay

## 2015-12-04 VITALS — BP 118/72 | HR 84 | Temp 98.6°F | Wt 167.8 lb

## 2015-12-04 DIAGNOSIS — L03032 Cellulitis of left toe: Secondary | ICD-10-CM | POA: Diagnosis not present

## 2015-12-04 MED ORDER — CEPHALEXIN 500 MG PO CAPS: 500.0000 mg | ORAL_CAPSULE | Freq: Three times a day (TID) | ORAL | 0 refills | Status: DC

## 2015-12-04 NOTE — Telephone Encounter (Signed)
I spoke with pt and scheduled appt 12/04/15 at 3 pm with Avie Echevaria NP.
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
## 2015-12-04 NOTE — Patient Instructions (Signed)

## 2015-12-04 NOTE — Telephone Encounter (Signed)
PLEASE NOTE: All timestamps contained within this report are represented as [place] Standard Time. CONFIDENTIALTY NOTICE: This fax transmission is intended only for the addressee. It contains information that is legally privileged, confidential or otherwise protected from use or disclosure. If you are not the intended recipient, you are strictly prohibited from reviewing, disclosing, copying using or disseminating any of this information or taking any action in reliance on or regarding this information. If you have received this fax in error, please notify us immediately by telephone so that we can arrange for its return to [place]. Phone: [phone], Toll-Free: [phone], Fax: [phone] Page: 1 of 2 Call Id: [id number] [place] Patient Name: [facility] Gender: Female DOB: 1945/10/20 Age: 70 Y 62 M 26 D Return Phone Number: [phone] (Primary), [phone] (Secondary) Address: [place] [address] Client [facility] Night - Client Client Site [place] Physician Ria Bush - MD Contact Type Call Who Is Calling Patient / Member / Family / Caregiver Call Type Triage / Clinical Relationship To Patient Self Return Phone Number [phone] (Primary) Chief Complaint Toe Pain Reason for Call Symptomatic / Request for [place] states had a shoe that rubbed the top of her toe, then it had a pus filled sore, blue in color around the area. Opened it and soaked it in Epsom slat, peroxide, and alcohol. Wants to know is there anything else she can do. Is concerned about getting it infected and having to have it amputated. PreDisposition Home Care Translation No Nurse Assessment Nurse: Windle Guard, RN, Lesa Date/Time (Eastern Time): 12/03/2015 9:41:32 PM Confirm and document reason for call. If  symptomatic, describe symptoms. You must click the next button to save text entered. ---Caller states she has a sore on her toe Has the patient traveled out of the country within the last 30 days? ---Yes Where have you traveled? ([place] for [place] and Ebola guideline, [place], [place] for [employer]) ---[place] Does the patient have any new or worsening symptoms? ---Yes Will a triage be completed? ---Yes Related visit to physician within the last 2 weeks? ---No Does the PT have any chronic conditions? (i.e. diabetes, asthma, etc.) ---No Is this a behavioral health or substance abuse call? ---No Guidelines Guideline Title Affirmed Question Affirmed Notes Nurse Date/Time (Eastern Time) Sores [1] Small red streak or spreading redness (<2 inches; 5 cm) AND [2] no fever Conner, RN, Lesa 12/03/2015 9:44:12 PM PLEASE NOTE: All timestamps contained within this report are represented as [place] Standard Time. CONFIDENTIALTY NOTICE: This fax transmission is intended only for the addressee. It contains information that is legally privileged, confidential or otherwise protected from use or disclosure. If you are not the intended recipient, you are strictly prohibited from reviewing, disclosing, copying using or disseminating any of this information or taking any action in reliance on or regarding this information. If you have received this fax in error, please notify us immediately by telephone so that we can arrange for its return to [place]. Phone: [phone], Toll-Free: [phone], Fax: [phone] Page: 2 of 2 Call Id: [id number] [place]. Time [name] Time) Disposition Final User 12/03/2015 9:57:34 PM See Physician within 24 Hours Yes Conner, RN, Emmaline Kluver Caller Understands: Yes Disagree/Comply: Comply Care Advice Given Per Guideline SEE PHYSICIAN WITHIN 24 HOURS: * IF OFFICE WILL BE OPEN: You need to be seen within the next 24 hours. Call your doctor when  the office opens, and make an  appointment. CLEANSING: Wash the infected area with warm water and an antibacterial soap three times a day. ANTIBIOTIC OINTMENT: * Apply an OTC antibiotic ointment (e.g., Bacitracin) 3 times per day. * Cover the sore with a Band-Aid to prevent scratching and spread. * Repeat washing with antibacterial soap, ointment and Band-Aid three times a day. CALL BACK IF: * Fever occurs * You become worse. CARE ADVICE given per Sores (Adult) guideline. Referrals REFERRED TO PCP OFFICE

## 2015-12-05 ENCOUNTER — Telehealth: Payer: Self-pay

## 2015-12-05 ENCOUNTER — Ambulatory Visit: Payer: Medicare Other | Admitting: Internal Medicine

## 2015-12-05 NOTE — Telephone Encounter (Signed)
Agree. Pt should be on paxil 20mg  daily.

## 2015-12-05 NOTE — Telephone Encounter (Signed)
I'm not sure why 30mg  tablet was sent in or even requested. 20mg  was sent in on 10/08/15 #90 1RF. This is basically a 6 month supply. Refills wouldn't be needed at this time and 30mg  shouldn't have been sent in as med list and office note clearly state 20mg . Patient notified to disregard the 30mg  tablets and verbalized understanding.

## 2015-12-05 NOTE — Telephone Encounter (Signed)
Pt left v/m; pt received call from [facility] that rx ready for pickup. Pt went to pharmacy and rx was for paroxetine 30 mg. Pt said she was decreased to 20 mg and did not pick up rx. I spoke with Kayla at [facility] and [facility]etine 30 mg #90 x 1 on 11/30/15. On hx med list appears that [facility] sent electronical request for paroxetine 30 mg. Pt wants to know why paroxetine 30 mg was sent to pharmacy when pt has been decreased to 20 mg. 20 mg is on current med list.pt request cb.

## 2015-12-10 ENCOUNTER — Telehealth: Payer: Self-pay

## 2015-12-10 NOTE — Telephone Encounter (Signed)
PLEASE NOTE: All timestamps contained within this report are represented as [place] Standard Time. CONFIDENTIALTY NOTICE: This fax transmission is intended only for the addressee. It contains information that is legally privileged, confidential or otherwise protected from use or disclosure. If you are not the intended recipient, you are strictly prohibited from reviewing, disclosing, copying using or disseminating any of this information or taking any action in reliance on or regarding this information. If you have received this fax in error, please notify us immediately by telephone so that we can arrange for its return to [place]. Phone: [phone], Toll-Free: [phone], Fax: [phone] Page: 1 of 2 Call Id: [id number] [place] Patient Name: [facility] Gender: Female DOB: [birth year] Age: 70 Y 46 M Return Phone Number: [phone] (Primary), [phone] (Secondary) Address: [place] [address] Client [facility] Night - Client Client Site [place] Physician Ria Bush - MD Contact Type Call Who Is Calling Patient / Member / Family / Caregiver Call Type Triage / Clinical Relationship To Patient Self Return Phone Number [phone] (Primary) Chief Complaint Cough Reason for Call Symptomatic / Request for Health Information Initial Comment Caller was put on ABX for toe infection. wants to know if it will help with what is going on in her chest PreDisposition Call Doctor Translation No Nurse Assessment Nurse: Lillia Corporal, RN, Lenell Antu Date/Time [name] Time): 12/09/2015 12:20:31 PM Confirm and document reason for call. If symptomatic, describe symptoms. You must click the next button to save text entered. ---Caller was put on ABX for toe infection. It was prescribed 5 days ago. States she only took 9 doses (3 days).  Her last dose was this am. Wants to know if it will help with what is going on in her chest. States is cephalexin 500 mg capsule, 1 TID x 7days. States she has been feeling a pain on upper chest and throat. States she would wake in the morning and cough up chunks of discharge. States her respiratory issues began this past week. Has the patient traveled out of the country within the last 30 days? ---Not Applicable Does the patient have any new or worsening symptoms? ---Yes Will a triage be completed? ---Yes Related visit to physician within the last 2 weeks? ---No Does the PT have any chronic conditions? (i.e. diabetes, asthma, etc.) ---Yes List chronic conditions. ---anxiety, depression, Is this a behavioral health or substance abuse call? ---No Guidelines Guideline Title Affirmed Question Affirmed Notes Nurse Date/Time (Eastern Time) Cough - Acute Productive ALSO, mild central chest pain occurs only when coughing Lillia Corporal, RN, Lenell Antu 12/09/2015 12:32:54 PM PLEASE NOTE: All timestamps contained within this report are represented as [place] Standard Time. CONFIDENTIALTY NOTICE: This fax transmission is intended only for the addressee. It contains information that is legally privileged, confidential or otherwise protected from use or disclosure. If you are not the intended recipient, you are strictly prohibited from reviewing, disclosing, copying using or disseminating any of this information or taking any action in reliance on or regarding this information. If you have received this fax in error, please notify us immediately by telephone so that we can arrange for its return to [place]. Phone: [phone], Toll-Free: [phone], Fax: [phone] Page: 2 of 2 Call Id: [id number] Guidelines Guideline Title Affirmed Question Affirmed Notes Nurse Date/Time [name] Time) Cellulitis on Antibiotic Follow-up Call Cellulitis, questions about Leonia Reader 12/09/2015 12:42:29 PM Disp. Time  [name] Time)  Disposition Final User 12/09/2015 12:01:03 PM Attempt made - no message left Leonia Reader 12/09/2015 12:40:31 PM [place], RN, Lenell Antu 12/09/2015 [place], RN, Lenell Antu Caller Understands: Yes Disagree/Comply: Comply Caller Understands: Yes Disagree/Comply: Comply Care Advice Given Per Guideline HOME CARE: You should be able to treat this at home. REASSURANCE: - HOME REMEDY - HONEY: This old home remedy has been shown to help decrease coughing at night. The adult dosage is 2 teaspoons (10 ml) at bedtime. Honey should not be given to infants under one year of age. CALL BACK IF: * Fever over 103 F (39.4 C) * Difficulty breathing occurs * You become worse. CARE ADVICE given per Cough - Acute Productive (Adult) guideline. CALL BACK IF: * Chest pain increases or becomes constant. * Difficulty breathing occurs * You become worse. HOME CARE: You should be able to treat this at home. CELLULITIS - DEFINITION: * Cellulitis is a bacterial skin infection. CELLULITIS - SYMPTOMS: * Skin redness in area of infection. It may be difficult to see the red color in people with darkercolored skin. * Skin warmth in area of infection * Pain in area of infection * Tender to touch in area of infection * Mild skin swelling or puffiness in the area of the infection. CELLULITIS - TREATMENT: Cellulitis is treated with antibiotics. CALL BACK IF: * You become worse. CARE ADVICE given per Cellulitis on Antibiotic Follow-Up Call (Adult) guideline. Comments User: Arcola Jansky, RN Date/Time [name] Time): 12/09/2015 12:01:36 PM Both phone numbers attempted, caller did not have a voicemail available for messages. User: Arcola Jansky, RN Date/Time [name] Time): 12/09/2015 12:46:04 PM Teaching on management of toe skin infection (hygiene, use of comfortable walking, well- fitted shoes and socks to walk outdoors and avoiding picking at dead skin) discussed. Explained that ideally  antibiotics should be taken and finished as prescribed.

## 2015-12-10 NOTE — Telephone Encounter (Signed)
No; I was not able to reach pt by phone.

## 2015-12-10 NOTE — Telephone Encounter (Signed)
plz schedule next avail appt in office. Thanks.

## 2015-12-10 NOTE — Telephone Encounter (Signed)
Did you schedule an appt for her?

## 2015-12-10 NOTE — Telephone Encounter (Signed)
Spoke with patient and she said she is feeling much better now and doesn't need to be seen. She will call back if anything changes.

## 2015-12-10 NOTE — Telephone Encounter (Signed)
PLEASE NOTE: All timestamps contained within this report are represented as [place] Standard Time. CONFIDENTIALTY NOTICE: This fax transmission is intended only for the addressee. It contains information that is legally privileged, confidential or otherwise protected from use or disclosure. If you are not the intended recipient, you are strictly prohibited from reviewing, disclosing, copying using or disseminating any of this information or taking any action in reliance on or regarding this information. If you have received this fax in error, please notify us immediately by telephone so that we can arrange for its return to [place]. Phone: [phone], Toll-Free: [phone], Fax: [phone] Page: 1 of 1 Call Id: [id number] [place] Patient Name: [facility] Gender: Female DOB: 1946/01/27 Age: 70 Y 36 M Return Phone Number: [phone] (Primary), [phone] (Secondary) Address: [place] [address] Client [place] Night - Client Client Site [place] Physician Ria Bush - MD Contact Type Call Who Is Calling Patient / Member / Family / Caregiver Call Type Triage / Clinical Relationship To Patient Self Return Phone Number [phone] (Primary) Chief Complaint Cough Reason for Call Symptomatic / Request for [place] says, this morning she started a very productive cough. It hurts when she coughs. She does not have pain when she is not coughing. Low grade temp. Translation No Nurse Assessment Guidelines Guideline Title Affirmed Question Affirmed Notes Nurse Date/Time (Eastern Time) Disp. Time [name] Time) Disposition Final User 12/09/2015 3:27:18 PM Attempt made - no message left Idamae Lusher 12/09/2015 3:48:51 PM FINAL ATTEMPT MADE - message left Yes Donovan Kail, RN,  Barnetta Chapel

## 2015-12-11 ENCOUNTER — Ambulatory Visit (INDEPENDENT_AMBULATORY_CARE_PROVIDER_SITE_OTHER): Payer: Medicare Other | Admitting: Family Medicine

## 2015-12-11 ENCOUNTER — Encounter: Payer: Self-pay | Admitting: Family Medicine

## 2015-12-11 ENCOUNTER — Telehealth: Payer: Self-pay | Admitting: Family Medicine

## 2015-12-11 DIAGNOSIS — R059 Cough, unspecified: Secondary | ICD-10-CM

## 2015-12-11 DIAGNOSIS — R05 Cough: Secondary | ICD-10-CM

## 2015-12-11 NOTE — Telephone Encounter (Signed)
Pt called checking on what she can to about snoring

## 2015-12-11 NOTE — Progress Notes (Signed)
L 3rd toe improved on keflex.  Didn't have cough at that point.  Cough started about 2 days ago, discolored sputum. Chest was sore from coughing.  No fever today but had a fever yesterday and the day before.  No vomiting, no diarrhea.  No ear pain.  No ST.  Stuffy nose.  Less sputum today.  She feels better today.    Meds, vitals, and allergies reviewed.   ROS: Per HPI unless specifically indicated in ROS section   GEN: nad, alert and oriented HEENT: mucous membranes moist NECK: supple w/o LA CV: rrr.  PULM: ctab except for B coarse BS, no inc wob ABD: soft, +bs EXT: no edema SKIN: no acute rash

## 2015-12-11 NOTE — Patient Instructions (Signed)
Likely bronchitis, should get better.  Rest and fluids.  Update [place] as needed.

## 2015-12-11 NOTE — Progress Notes (Signed)
Pre visit review using our clinic review tool, if applicable. No additional management support is needed unless otherwise documented below in the visit note. 

## 2015-12-12 DIAGNOSIS — R05 Cough: Secondary | ICD-10-CM | POA: Insufficient documentation

## 2015-12-12 DIAGNOSIS — R059 Cough, unspecified: Secondary | ICD-10-CM | POA: Insufficient documentation

## 2015-12-12 NOTE — Assessment & Plan Note (Signed)
Presumed bronchitis. No focal decrease in breath sounds to suggest pneumonia. She is improved today compared to yesterday. Nontoxic. Already on Keflex. Continue as is with supportive care. Follow-up when necessary. Discussed with patient.

## 2015-12-12 NOTE — Telephone Encounter (Signed)
I would not address this until her current issue is resolved. If she still snoring after her cough and cold symptoms are resolved, then follow-up with PCP. Thanks.

## 2015-12-12 NOTE — Telephone Encounter (Signed)
Patient notified as instructed by telephone and verbalized understanding. 

## 2015-12-13 ENCOUNTER — Telehealth: Payer: Self-pay

## 2015-12-13 MED ORDER — GUAIFENESIN-CODEINE 100-10 MG/5ML PO SYRP: 5.0000 mL | ORAL_SOLUTION | Freq: Three times a day (TID) | ORAL | 0 refills | Status: DC | PRN

## 2015-12-13 NOTE — Telephone Encounter (Signed)
Patient advised.   Patient says she is not really bothered with a cough, just still has some congestion in her chest (bronchial tubes).  Patient will call if she needs Cheratussin.

## 2015-12-13 NOTE — Telephone Encounter (Signed)
Pt left v/m; pt was seen 12/11/15 pt has 2 pills of cephalexin and will finish that med today; pt still has a lot of congestion in chest with cough and pt wants to know if needs more abx or what to do. Pt request cb. [facility]. Pt will not be able to schedule appt on 12/14/15.

## 2015-12-13 NOTE — Telephone Encounter (Signed)
Noted. Thanks.

## 2015-12-13 NOTE — Telephone Encounter (Signed)
I would finish the abx and she can try cheratussin if needed, please call in if desired.  Thanks.

## 2015-12-20 ENCOUNTER — Ambulatory Visit: Payer: Medicare Other | Admitting: Family Medicine

## 2015-12-21 ENCOUNTER — Other Ambulatory Visit: Payer: Self-pay | Admitting: Family Medicine

## 2015-12-21 NOTE — Telephone Encounter (Signed)
Rx called in as directed.   

## 2015-12-21 NOTE — Telephone Encounter (Signed)
plz phone in. 

## 2015-12-21 NOTE — Telephone Encounter (Signed)
Ok to refill? Last filled 11/22/15 #[id number] 0RF

## 2016-01-04 ENCOUNTER — Ambulatory Visit: Payer: Medicare Other | Admitting: Family Medicine

## 2016-01-15 ENCOUNTER — Other Ambulatory Visit: Payer: Self-pay | Admitting: Family Medicine

## 2016-01-15 DIAGNOSIS — Z1231 Encounter for screening mammogram for malignant neoplasm of breast: Secondary | ICD-10-CM

## 2016-01-19 ENCOUNTER — Other Ambulatory Visit: Payer: Self-pay | Admitting: Family Medicine

## 2016-01-21 ENCOUNTER — Other Ambulatory Visit: Payer: Self-pay | Admitting: Family Medicine

## 2016-01-21 NOTE — Telephone Encounter (Signed)
Pt left v/m requesting cb with status of lorazepam refill; pt is out of med.

## 2016-01-21 NOTE — Telephone Encounter (Signed)
plz phone in. 

## 2016-01-21 NOTE — Telephone Encounter (Signed)
Rx called to pharmacy as instructed.  Left detailed message on voicemail that script has been called to the pharmacy (DPR).

## 2016-01-31 ENCOUNTER — Other Ambulatory Visit: Payer: Self-pay | Admitting: Family Medicine

## 2016-01-31 DIAGNOSIS — Z1159 Encounter for screening for other viral diseases: Secondary | ICD-10-CM

## 2016-01-31 DIAGNOSIS — N183 Chronic kidney disease, stage 3 unspecified: Secondary | ICD-10-CM

## 2016-01-31 DIAGNOSIS — E785 Hyperlipidemia, unspecified: Secondary | ICD-10-CM

## 2016-02-01 ENCOUNTER — Other Ambulatory Visit: Payer: Medicare Other

## 2016-02-01 ENCOUNTER — Other Ambulatory Visit (INDEPENDENT_AMBULATORY_CARE_PROVIDER_SITE_OTHER): Payer: Medicare Other

## 2016-02-01 ENCOUNTER — Telehealth: Payer: Self-pay

## 2016-02-01 DIAGNOSIS — N183 Chronic kidney disease, stage 3 unspecified: Secondary | ICD-10-CM

## 2016-02-01 DIAGNOSIS — E785 Hyperlipidemia, unspecified: Secondary | ICD-10-CM

## 2016-02-01 DIAGNOSIS — Z1159 Encounter for screening for other viral diseases: Secondary | ICD-10-CM

## 2016-02-01 LAB — LIPID PANEL
Cholesterol: 200 mg/dL (ref 0–200)
HDL: 57.9 mg/dL (ref >39.00)
LDL Cholesterol: 117 mg/dL — ABNORMAL HIGH (ref 0–99)
NonHDL: 142.06
Total CHOL/HDL Ratio: 3
Triglycerides: 123 mg/dL (ref 0.0–149.0)
VLDL: 24.6 mg/dL (ref 0.0–40.0)

## 2016-02-01 LAB — CBC WITH DIFFERENTIAL/PLATELET
Basophils Absolute: 0 10*3/uL (ref 0.0–0.1)
Basophils Relative: 0.7 % (ref 0.0–3.0)
Eosinophils Absolute: 0.1 10*3/uL (ref 0.0–0.7)
Eosinophils Relative: 2 % (ref 0.0–5.0)
HCT: 43.6 % (ref 36.0–46.0)
Hemoglobin: 14.8 g/dL (ref 12.0–15.0)
Lymphocytes Relative: 27.1 % (ref 12.0–46.0)
Lymphs Abs: 1.8 10*3/uL (ref 0.7–4.0)
MCHC: 33.8 g/dL (ref 30.0–36.0)
MCV: 87 fl (ref 78.0–100.0)
Monocytes Absolute: 0.5 10*3/uL (ref 0.1–1.0)
Monocytes Relative: 7 % (ref 3.0–12.0)
Neutro Abs: 4.3 10*3/uL (ref 1.4–7.7)
Neutrophils Relative %: 63.2 % (ref 43.0–77.0)
Platelets: 185 10*3/uL (ref 150.0–400.0)
RBC: 5.01 Mil/uL (ref 3.87–5.11)
RDW: 13.3 % (ref 11.5–15.5)
WBC: 6.8 10*3/uL (ref 4.0–10.5)

## 2016-02-01 LAB — RENAL FUNCTION PANEL
Albumin: 4.2 g/dL (ref 3.5–5.2)
BUN: 14 mg/dL (ref 6–23)
CO2: 26 mEq/L (ref 19–32)
Calcium: 9.5 mg/dL (ref 8.4–10.5)
Chloride: 106 mEq/L (ref 96–112)
Creatinine, Ser: 1.17 mg/dL (ref 0.40–1.20)
GFR: 48.48 mL/min — ABNORMAL LOW (ref >60.00)
Glucose, Bld: 102 mg/dL — ABNORMAL HIGH (ref 70–99)
Phosphorus: 3.1 mg/dL (ref 2.3–4.6)
Potassium: 3.9 mEq/L (ref 3.5–5.1)
Sodium: 140 mEq/L (ref 135–145)

## 2016-02-01 LAB — VITAMIN D 25 HYDROXY (VIT D DEFICIENCY, FRACTURES): VITD: 37.24 ng/mL (ref 30.00–100.00)

## 2016-02-01 NOTE — Telephone Encounter (Signed)
PLEASE NOTE: All timestamps contained within this report are represented as [place] Standard Time. CONFIDENTIALTY NOTICE: This fax transmission is intended only for the addressee. It contains information that is legally privileged, confidential or otherwise protected from use or disclosure. If you are not the intended recipient, you are strictly prohibited from reviewing, disclosing, copying using or disseminating any of this information or taking any action in reliance on or regarding this information. If you have received this fax in error, please notify us immediately by telephone so that we can arrange for its return to [place]. Phone: [phone], Toll-Free: [phone], Fax: [phone] Page: 1 of 1 Call Id: [id number] [place] Night - Client Nonclinical Telephone Record [place] Night - Client Client Site [place] Physician Ria Bush - MD Contact Type Call Who Is Calling Patient / Member / Family / Caregiver Caller Name [place] Phone Number [phone] Patient Name Karen [name] Call Type Message Only Information Provided Reason for Call Request to Reschedule Office Appointment Initial Comment Caller states she needs to reschedule her 815am appointment for blood work. Additional Comment She would like to come in later this afternoon if possible. Please call. Call Closed By: [place] [facility] Date/Time: 02/01/2016 5:41:18 AM (ET)

## 2016-02-01 NOTE — Telephone Encounter (Signed)
Unable to reach pt by either contact #; sent note to [facility] lab.

## 2016-02-02 LAB — HEPATITIS C ANTIBODY: HCV Ab: NEGATIVE

## 2016-02-08 ENCOUNTER — Ambulatory Visit (INDEPENDENT_AMBULATORY_CARE_PROVIDER_SITE_OTHER): Payer: Medicare Other | Admitting: Family Medicine

## 2016-02-08 ENCOUNTER — Encounter: Payer: Self-pay | Admitting: Family Medicine

## 2016-02-08 VITALS — BP 118/82 | HR 72 | Temp 98.4°F | Ht 62.75 in | Wt 164.0 lb

## 2016-02-08 DIAGNOSIS — E785 Hyperlipidemia, unspecified: Secondary | ICD-10-CM

## 2016-02-08 DIAGNOSIS — Z23 Encounter for immunization: Secondary | ICD-10-CM | POA: Diagnosis not present

## 2016-02-08 DIAGNOSIS — M8580 Other specified disorders of bone density and structure, unspecified site: Secondary | ICD-10-CM

## 2016-02-08 DIAGNOSIS — Z0000 Encounter for general adult medical examination without abnormal findings: Secondary | ICD-10-CM

## 2016-02-08 DIAGNOSIS — F4323 Adjustment disorder with mixed anxiety and depressed mood: Secondary | ICD-10-CM

## 2016-02-08 DIAGNOSIS — N183 Chronic kidney disease, stage 3 unspecified: Secondary | ICD-10-CM

## 2016-02-08 DIAGNOSIS — Z7189 Other specified counseling: Secondary | ICD-10-CM

## 2016-02-08 NOTE — Assessment & Plan Note (Addendum)
Advanced directives: thinks she has at home. Asked to bring me copy. Doesn't want prolonged life support, ok for reversible condition. HCPOA is Todd son then Donna daughter but this has not been updated. Packet provided today.   

## 2016-02-08 NOTE — Assessment & Plan Note (Signed)
Stable period. Continue to monitor.  

## 2016-02-08 NOTE — Assessment & Plan Note (Signed)
Continue paxil 20mg . Continues to taper ativan.

## 2016-02-08 NOTE — Progress Notes (Signed)
BP 118/82   Pulse 72   Temp 98.4 F (36.9 C) (Oral)   Ht 5' 2.75" (1.594 m)   Wt 164 lb (74.4 kg)   BMI 29.28 kg/m    CC: medicare wellness visit  Subjective:    Patient ID: Karen [name], female    DOB: [birth year], 70 y.o.   MRN: [id number]  HPI: Karen [name] is a 70 y.o. female presenting on 02/08/2016 for Annual Exam   Hearing screen passed Vision screen with Dr Bing Plume Denies depression No falls in last year  Preventative: COLONOSCOPY Date: 12/2014 1 polyp - will request records again today Collene Mares) Mammogram 01/2015 WNL  Well woman - s/p hysterectomy for benign reasons. Ovaries removed.  DEXA Date: 11/2012 T score -2.4 AP spine Osteopenia  Flu shot yearly Pneumovax 2014, prevnar 2015 Td 1999. May have had another during her accident but unsure. Zostavax 2012  Advanced directives: thinks she has at home. Asked to bring me copy. Doesn't want prolonged life support, ok for reversible condition. HCPOA is Sherren Mocha son then Butch Penny daughter but this has not been updated. Packet provided today.  Seat belt use discussed.  Sunscreen use discussed. No changing moles on skin.  Non smoker  Alcohol - none   Caffeine: lots of pepsi Lives alone - children live nearby.  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 Interim medical history since our last visit reviewed. Allergies and medications reviewed and updated. Current Outpatient Prescriptions on File Prior to Visit  Medication Sig  . aspirin EC 81 MG tablet Take 81 mg by mouth every Monday, Wednesday, and Friday.  . Calcium Carb-Cholecalciferol (CALCIUM-VITAMIN D) 600-400 MG-UNIT TABS Take 1 tablet by mouth 2 (two) times daily.  . cholecalciferol (VITAMIN D) 1000 UNITS tablet Take 1,000 Units by mouth daily.  Marland Kitchen Cod Liver Oil 1000 MG CAPS Take  1 capsule by mouth daily.  Marland Kitchen LORazepam (ATIVAN) 0.5 MG tablet TAKE 1 TABLET BY MOUTH TWICE A DAY AS NEEDED  . Multiple Vitamin (MULTIVITAMIN) tablet Take 1 tablet by mouth daily.  . Omega-3 Fatty Acids (FISH OIL) 1000 MG CAPS Take 1 capsule by mouth daily.  Marland Kitchen PARoxetine (PAXIL) 20 MG tablet Take 1 tablet (20 mg total) by mouth daily.  . vitamin E 200 UNIT capsule Take 400 Units by mouth daily.   No current facility-administered medications on file prior to visit.     Review of Systems  Constitutional: Negative for activity change, appetite change, chills, fatigue, fever and unexpected weight change.  HENT: Negative for hearing loss.   Eyes: Negative for visual disturbance.  Respiratory: Negative for cough, chest tightness, shortness of breath and wheezing.   Cardiovascular: Negative for chest pain, palpitations and leg swelling.  Gastrointestinal: Positive for constipation. Negative for abdominal distention, abdominal pain, blood in stool, diarrhea, nausea and vomiting.  Genitourinary: Negative for difficulty urinating and hematuria.  Musculoskeletal: Negative for arthralgias, myalgias and neck pain.  Skin: Negative for rash.  Neurological: Negative for dizziness, seizures, syncope and headaches.  Hematological: Negative for adenopathy. Does not bruise/bleed easily.  Psychiatric/Behavioral: Negative for dysphoric mood. The patient is nervous/anxious (chronic).    Per HPI unless specifically indicated in ROS section     Objective:    BP 118/82   Pulse 72   Temp 98.4 F (36.9 C) (Oral)   Ht 5' 2.75" (1.594  m)   Wt 164 lb (74.4 kg)   BMI 29.28 kg/m   Wt Readings from Last 3 Encounters:  02/08/16 164 lb (74.4 kg)  12/11/15 166 lb 12.8 oz (75.7 kg)  12/04/15 167 lb 12 oz (76.1 kg)    Physical Exam  Constitutional: She is oriented to person, place, and time. She appears well-developed and well-nourished. No distress.  HENT:  Head: Normocephalic and atraumatic.  Right Ear:  Hearing, tympanic membrane, external ear and ear canal normal.  Left Ear: Hearing, tympanic membrane, external ear and ear canal normal.  Nose: Nose normal.  Mouth/Throat: Uvula is midline, oropharynx is clear and moist and mucous membranes are normal. No oropharyngeal exudate, posterior oropharyngeal edema or posterior oropharyngeal erythema.  Eyes: Conjunctivae and EOM are normal. Pupils are equal, round, and reactive to light. No scleral icterus.  Neck: Normal range of motion. Neck supple. Carotid bruit is not present. No thyromegaly present.  Cardiovascular: Normal rate, regular rhythm, normal heart sounds and intact distal pulses.   No murmur heard. Pulses:      Radial pulses are 2+ on the right side, and 2+ on the left side.  Pulmonary/Chest: Effort normal and breath sounds normal. No respiratory distress. She has no wheezes. She has no rales.  Abdominal: Soft. Bowel sounds are normal. She exhibits no distension and no mass. There is no tenderness. There is no rebound and no guarding.  Musculoskeletal: Normal range of motion. She exhibits no edema.  Lymphadenopathy:    She has no cervical adenopathy.  Neurological: She is alert and oriented to person, place, and time.  CN grossly intact, station and gait intact Recall 3/3  Calculation D-L-R-[id number]-W  Skin: Skin is warm and dry. No rash noted.  Psychiatric: She has a normal mood and affect. Her behavior is normal. Judgment and thought content normal.  Nursing note and vitals reviewed.  Results for orders placed or performed in visit on 02/01/16  CBC with Differential/Platelet  Result Value Ref Range   WBC 6.8 4.0 - 10.5 K/uL   RBC 5.01 3.87 - 5.11 Mil/uL   Hemoglobin 14.8 12.0 - 15.0 g/dL   HCT 43.6 36.0 - 46.0 %   MCV 87.0 78.0 - 100.0 fl   MCHC 33.8 30.0 - 36.0 g/dL   RDW 13.3 11.5 - 15.5 %   Platelets 185.0 150.0 - 400.0 K/uL   Neutrophils Relative % 63.2 43.0 - 77.0 %   Lymphocytes Relative 27.1 12.0 - 46.0 %   Monocytes  Relative 7.0 3.0 - 12.0 %   Eosinophils Relative 2.0 0.0 - 5.0 %   Basophils Relative 0.7 0.0 - 3.0 %   Neutro Abs 4.3 1.4 - 7.7 K/uL   Lymphs Abs 1.8 0.7 - 4.0 K/uL   Monocytes Absolute 0.5 0.1 - 1.0 K/uL   Eosinophils Absolute 0.1 0.0 - 0.7 K/uL   Basophils Absolute 0.0 0.0 - 0.1 K/uL  Renal function panel  Result Value Ref Range   Sodium 140 135 - 145 mEq/L   Potassium 3.9 3.5 - 5.1 mEq/L   Chloride 106 96 - 112 mEq/L   CO2 26 19 - 32 mEq/L   Calcium 9.5 8.4 - 10.5 mg/dL   Albumin 4.2 3.5 - 5.2 g/dL   BUN 14 6 - 23 mg/dL   Creatinine, Ser 1.17 0.40 - 1.20 mg/dL   Glucose, Bld 102 (H) 70 - 99 mg/dL   Phosphorus 3.1 2.3 - 4.6 mg/dL   GFR 48.48 (L) >60.00 mL/min  Lipid panel  Result Value Ref Range   Cholesterol 200 0 - 200 mg/dL   Triglycerides 123.0 0.0 - 149.0 mg/dL   HDL 57.90 >39.00 mg/dL   VLDL 24.6 0.0 - 40.0 mg/dL   LDL Cholesterol 117 (H) 0 - 99 mg/dL   Total CHOL/HDL Ratio 3    NonHDL 142.06   VITAMIN D 25 Hydroxy (Vit-D Deficiency, Fractures)  Result Value Ref Range   VITD 37.24 30.00 - 100.00 ng/mL  Hepatitis C antibody  Result Value Ref Range   HCV Ab NEGATIVE NEGATIVE      Assessment & Plan:   Problem List Items Addressed This Visit    Adjustment disorder with mixed anxiety and depressed mood    Continue paxil 20mg . Continues to taper ativan.       Advanced care planning/counseling discussion    Advanced directives: thinks she has at home. Asked to bring me copy. Doesn't want prolonged life support, ok for reversible condition. HCPOA is Sherren Mocha son then Butch Penny daughter but this has not been updated. Packet provided today.       CKD (chronic kidney disease) stage 3, GFR 30-59 ml/min    Stable period. Continue to monitor.      Health maintenance examination    Preventative protocols reviewed and updated unless pt declined. Discussed healthy diet and lifestyle.       HLD (hyperlipidemia)    Chronic stable off meds.  ASCVD 10 yr risk = 8.1%. Pt  declines statin. rec mediterranean diet.      Medicare annual wellness visit, subsequent - Primary    I have personally reviewed the Medicare Annual Wellness questionnaire and have noted 1. The patient's medical and social history 2. Their use of alcohol, tobacco or illicit drugs 3. Their current medications and supplements 4. The patient's functional ability including ADL's, fall risks, home safety risks and hearing or visual impairment. Cognitive function has been assessed and addressed as indicated.  5. Diet and physical activity 6. Evidence for depression or mood disorders The patients weight, height, BMI have been recorded in the chart. I have made referrals, counseling and provided education to the patient based on review of the above and I have provided the pt with a written personalized care plan for preventive services. Provider list updated.. See scanned questionairre as needed for further documentation. Reviewed preventative protocols and updated unless pt declined.       Osteopenia    Continue calcium , vit D and regular walking.           Follow up plan: Return in about 4 months (around 06/07/2016) for follow up visit.  Ria Bush, MD

## 2016-02-08 NOTE — Progress Notes (Signed)
Pre visit review using our clinic review tool, if applicable. No additional management support is needed unless otherwise documented below in the visit note. 

## 2016-02-08 NOTE — Assessment & Plan Note (Signed)
Preventative protocols reviewed and updated unless pt declined. Discussed healthy diet and lifestyle.  

## 2016-02-08 NOTE — Assessment & Plan Note (Signed)
Chronic stable off meds.  ASCVD 10 yr risk = 8.1%. Pt declines statin. rec mediterranean diet.

## 2016-02-08 NOTE — Assessment & Plan Note (Signed)

## 2016-02-08 NOTE — Patient Instructions (Addendum)
Flu shot today.  Sign release for colonoscopy records from Dr Collene Mares 12/2014.  Advanced directive packet provided today  Check on living will at home.  Increase water to keep kidneys well hydrated. I recommend mediterranean diet. See below.  Return as needed or in 3-4 months for follow up.       Mediterranean Diet  Why follow it? Research shows. . Those who follow the Mediterranean diet have a reduced risk of heart disease  . The diet is associated with a reduced incidence of Parkinson's and Alzheimer's diseases . People following the diet may have longer life expectancies and lower rates of chronic diseases  . The Dietary Guidelines for Americans recommends the Mediterranean diet as an eating plan to promote health and prevent disease  What Is the Mediterranean Diet?  . Healthy eating plan based on typical foods and recipes of Mediterranean-style cooking . The diet is primarily a plant based diet; these foods should make up a majority of meals   Starches - Plant based foods should make up a majority of meals - They are an important sources of vitamins, minerals, energy, antioxidants, and fiber - Choose whole grains, foods high in fiber and minimally processed items  - Typical grain sources include wheat, oats, barley, corn, brown rice, bulgar, farro, millet, polenta, couscous  - Various types of beans include chickpeas, lentils, fava beans, black beans, white beans   Fruits  Veggies - Large quantities of antioxidant rich fruits & veggies; 6 or more servings  - Vegetables can be eaten raw or lightly drizzled with oil and cooked  - Vegetables common to the traditional Mediterranean Diet include: artichokes, arugula, beets, broccoli, brussel sprouts, cabbage, carrots, celery, collard greens, cucumbers, eggplant, kale, leeks, lemons, lettuce, mushrooms, okra, onions, peas, peppers, potatoes, pumpkin, radishes, rutabaga, shallots, spinach, sweet potatoes, turnips, zucchini - Fruits common to the  Mediterranean Diet include: apples, apricots, avocados, cherries, clementines, dates, figs, grapefruits, grapes, melons, nectarines, oranges, peaches, pears, pomegranates, strawberries, tangerines  Fats - Replace butter and margarine with healthy oils, such as olive oil, canola oil, and tahini  - Limit nuts to no more than a handful a day  - Nuts include walnuts, almonds, pecans, pistachios, pine nuts  - Limit or avoid candied, honey roasted or heavily salted nuts - Olives are central to the [employer] - can be eaten whole or used in a variety of dishes   Meats Protein - Limiting red meat: no more than a few times a month - When eating red meat: choose lean cuts and keep the portion to the size of deck of cards - Eggs: approx. 0 to 4 times a week  - Fish and lean poultry: at least 2 a week  - Healthy protein sources include, chicken, [place], lean beef, lamb - Increase intake of seafood such as tuna, salmon, trout, mackerel, shrimp, scallops - Avoid or limit high fat processed meats such as sausage and bacon  Dairy - Include moderate amounts of low fat dairy products  - Focus on healthy dairy such as fat free yogurt, skim milk, low or reduced fat cheese - Limit dairy products higher in fat such as whole or 2% milk, cheese, ice cream  Alcohol - Moderate amounts of red wine is ok  - No more than 5 oz daily for women (all ages) and men older than age 7  - No more than 10 oz of wine daily for men younger than 92  Other - Limit sweets and other desserts  -  Use herbs and spices instead of salt to flavor foods  - Herbs and spices common to the traditional Mediterranean Diet include: basil, bay leaves, chives, cloves, cumin, fennel, garlic, lavender, marjoram, mint, oregano, parsley, pepper, rosemary, sage, savory, sumac, tarragon, thyme   It's not just a diet, it's a lifestyle:  . The Mediterranean diet includes lifestyle factors typical of those in the region  . Foods, drinks and meals are  best eaten with others and savored . Daily physical activity is important for overall good health . This could be strenuous exercise like running and aerobics . This could also be more leisurely activities such as walking, housework, yard-work, or taking the stairs . Moderation is the key; a balanced and healthy diet accommodates most foods and drinks . Consider portion sizes and frequency of consumption of certain foods   Meal Ideas & Options:  . Breakfast:  o Whole wheat toast or whole wheat English muffins with peanut butter & hard boiled egg o Steel cut oats topped with apples & cinnamon and skim milk  o Fresh fruit: banana, strawberries, melon, berries, peaches  o Smoothies: strawberries, bananas, greek yogurt, peanut butter o Low fat greek yogurt with blueberries and granola  o Egg white omelet with spinach and mushrooms o Breakfast couscous: whole wheat couscous, apricots, skim milk, cranberries  . Sandwiches:  o Hummus and grilled vegetables (peppers, zucchini, squash) on whole wheat bread   o Grilled chicken on whole wheat pita with lettuce, tomatoes, cucumbers or tzatziki  o Tuna salad on whole wheat bread: tuna salad made with greek yogurt, olives, red peppers, capers, green onions o Garlic rosemary lamb pita: lamb sauted with garlic, rosemary, salt & pepper; add lettuce, cucumber, greek yogurt to pita - flavor with lemon juice and black pepper  . Seafood:  o Mediterranean grilled salmon, seasoned with garlic, basil, parsley, lemon juice and black pepper o Shrimp, lemon, and spinach whole-grain pasta salad made with low fat greek yogurt  o Seared scallops with lemon orzo  o Seared tuna steaks seasoned salt, pepper, coriander topped with tomato mixture of olives, tomatoes, olive oil, minced garlic, parsley, green onions and cappers  . Meats:  o Herbed greek chicken salad with kalamata olives, cucumber, feta  o Red bell peppers stuffed with spinach, bulgur, lean ground beef (or  lentils) & topped with feta   o Kebabs: skewers of chicken, tomatoes, onions, zucchini, squash  o [place] burgers: made with red onions, mint, dill, lemon juice, feta cheese topped with roasted red peppers . Vegetarian o Cucumber salad: cucumbers, artichoke hearts, celery, red onion, feta cheese, tossed in olive oil & lemon juice  o Hummus and whole grain pita points with a greek salad (lettuce, tomato, feta, olives, cucumbers, red onion) o Lentil soup with celery, carrots made with vegetable broth, garlic, salt and pepper  o Tabouli salad: parsley, bulgur, mint, scallions, cucumbers, tomato, radishes, lemon juice, olive oil, salt and pepper.

## 2016-02-08 NOTE — Assessment & Plan Note (Signed)
Continue calcium , vit D and regular walking.

## 2016-02-08 NOTE — Addendum Note (Signed)
Addended by: Royann Shivers A on: 02/08/2016 11:33 AM   Modules accepted: Orders

## 2016-02-12 ENCOUNTER — Telehealth: Payer: Self-pay

## 2016-02-12 NOTE — Telephone Encounter (Addendum)
Pt left v/m and wants Dr Darnell Level to research Shaklee mindworks med. Pt wants to verify it will be OK with Dr Darnell Level that pt take this med for mind sharpness. Pt request cb. Copy of med from internet is in Dr Synthia Innocent inbox. Pt also has question about paperwork given at end of visit.

## 2016-02-13 ENCOUNTER — Ambulatory Visit
Admission: RE | Admit: 2016-02-13 | Discharge: 2016-02-13 | Disposition: A | Discharge status: Home / Self Care | Payer: Medicare Other | Source: Ambulatory Visit | Attending: Family Medicine | Admitting: Family Medicine

## 2016-02-13 ENCOUNTER — Telehealth: Payer: Self-pay

## 2016-02-13 ENCOUNTER — Telehealth: Payer: Self-pay | Admitting: Family Medicine

## 2016-02-13 DIAGNOSIS — Z1231 Encounter for screening mammogram for malignant neoplasm of breast: Secondary | ICD-10-CM

## 2016-02-13 NOTE — Telephone Encounter (Signed)
PLEASE NOTE: All timestamps contained within this report are represented as [place] Standard Time. CONFIDENTIALTY NOTICE: This fax transmission is intended only for the addressee. It contains information that is legally privileged, confidential or otherwise protected from use or disclosure. If you are not the intended recipient, you are strictly prohibited from reviewing, disclosing, copying using or disseminating any of this information or taking any action in reliance on or regarding this information. If you have received this fax in error, please notify us immediately by telephone so that we can arrange for its return to [place]. Phone: [phone], Toll-Free: [phone], Fax: [phone] Page: 1 of 1 Call Id: [id number] [place] Patient Name: [facility] Gender: Female DOB: Nov 13, 1945 Age: 70 Y 11 M 5 D Return Phone Number: [phone] (Primary) Address: City/State/Zip: [place] [address] [facility] - Client Client Site [place] - Day Physician [facility] - PHYSICIAN, NOT LISTED- MD Contact Type Call Who Is Calling Patient / Member / Family / Caregiver Call Type Triage / Clinical Relationship To Patient Self Return Phone Number [phone] (Primary) Chief Complaint Health information question (non symptomatic) Reason for Call Medication Question Initial Comment Caller has blood work done and wants to talk to someone about her results Appointment Disposition EMR Appointment Not Necessary Info pasted into Epic No Translation No Nurse Assessment Nurse: Einar Gip, RN, Neoma Laming Date/Time (Eastern Time): 02/12/2016 8:53:49 PM Confirm and document reason for call. If symptomatic, describe symptoms. You must click the next button to save text entered. ---Caller denies symptoms for triage. Caller states her filtration rate is of concern. Advised  caller that I am unable to see her lab results and advised caller to discuss with PCP. Caller verbalized understanding. Has the patient traveled out of the country within the last 30 days? ---Not Applicable Does the patient have any new or worsening symptoms? ---No Guidelines Guideline Title Affirmed Question Affirmed Notes Nurse Date/Time (Eastern Time) Disp. Time [name] Time) Disposition Final User 02/12/2016 7:04:53 PM Send To Clinical Follow Up Gaylord Shih 02/12/2016 9:04:34 PM Clinical Call Yes Einar Gip, RN, Neoma Laming

## 2016-02-13 NOTE — Telephone Encounter (Signed)
Pt requesting call back regarding labs Thank you

## 2016-02-13 NOTE — Telephone Encounter (Signed)
Patient calling to ask if she could still eat "sweets". Advised to eat them in moderation and try to limit them.

## 2016-02-13 NOTE — Telephone Encounter (Signed)
Patient called Karen [name] and the office and left messages both places. Closing this note.

## 2016-02-14 ENCOUNTER — Other Ambulatory Visit: Payer: Self-pay | Admitting: Family Medicine

## 2016-02-14 NOTE — Telephone Encounter (Signed)
Rx called in as directed.   

## 2016-02-14 NOTE — Telephone Encounter (Signed)
plz phone in. 

## 2016-02-14 NOTE — Telephone Encounter (Signed)
Pt left voicemail at Triage, she said she will be out of meds on Sunday and is requesting Rx to be filled

## 2016-02-14 NOTE — Telephone Encounter (Signed)
Ok to refill? Last filled 01/21/16 #50 0RF

## 2016-02-18 ENCOUNTER — Encounter: Payer: Self-pay | Admitting: *Deleted

## 2016-02-18 LAB — HM MAMMOGRAPHY

## 2016-02-18 NOTE — Telephone Encounter (Signed)
Ok to take - main ingredient seems to be B12 vitamin. Unsure how helpful it will be but should not cause any harm. Don't recommend if it's very expensive.

## 2016-02-19 NOTE — Telephone Encounter (Signed)
Left detailed message.   

## 2016-02-26 ENCOUNTER — Telehealth: Payer: Self-pay

## 2016-02-26 NOTE — Telephone Encounter (Signed)
Patient calls to ask further about what types of drinks she should have and what exactly is in soda that she is to avoid?  In particular, can she have sweet tea?  She said that she discussed this with Dr. Darnell Level at her last office visit.  I read through note and not exactly sure how to communicate exactly what her individual limitations are in regards to these foods other than the general unhealthy nature of sodas and too much sugary drinks are not recommended and water is encouraged.   Please advise as to what individualized recommendations to give patient in regards to her fluid intake.

## 2016-02-27 NOTE — Telephone Encounter (Signed)
Patient notified

## 2016-02-27 NOTE — Telephone Encounter (Signed)
Agree - avoiding sodas and sweet teat as she's able to and as reasonable due to increased sugar content and acidity of dark carbonated beverages. Ok to drink here and there, but don't recommend as regular part of diet. Water is better for her kidneys.

## 2016-03-14 ENCOUNTER — Other Ambulatory Visit: Payer: Self-pay | Admitting: Family Medicine

## 2016-03-17 NOTE — Telephone Encounter (Signed)
plz phone in. 

## 2016-03-17 NOTE — Telephone Encounter (Signed)
Pt left v/m that pt is out of lorazepam and request sent to [place]. Medication phoned to v/m at [place] as instructed. Pt notified done.

## 2016-03-21 ENCOUNTER — Ambulatory Visit (INDEPENDENT_AMBULATORY_CARE_PROVIDER_SITE_OTHER): Payer: Medicare Other | Admitting: Family Medicine

## 2016-03-21 ENCOUNTER — Encounter: Payer: Self-pay | Admitting: Family Medicine

## 2016-03-21 VITALS — BP 120/72 | HR 65 | Wt 163.0 lb

## 2016-03-21 DIAGNOSIS — R0683 Snoring: Secondary | ICD-10-CM | POA: Diagnosis not present

## 2016-03-21 MED ORDER — FLUTICASONE PROPIONATE 50 MCG/ACT NA SUSP: 2.0000 | Freq: Every day | NASAL | 2 refills | Status: DC

## 2016-03-21 NOTE — Assessment & Plan Note (Signed)
Without significant concerns for OSA - ESS score of 1-2. Anticipate more allergic congestion related. rec nasal strips, nasal saline. If no better, trial flonase (Rx printed today). Pt agrees with plan.

## 2016-03-21 NOTE — Progress Notes (Signed)
BP 120/72   Pulse 65   Wt 163 lb (73.9 kg)   SpO2 94%   BMI 29.10 kg/m    CC: discuss snoring Subjective:    Patient ID: Karen [name], female    DOB: 1946-03-03, 70 y.o.   MRN: [id number]  HPI: Karen [name] is a 70 y.o. female presenting on 03/21/2016 for Snoring   Son Sherren Mocha has complained of her snoring. No witnessed apneic events. No daytime somnolence. No significant sleepiness. No PNdyspnea or orthopnea.   She does have nasal congestion despite daily claritin. No epistaxis.   Recently her pet ducks died - death by coyote or fox. Pt upset over this.   Relevant past medical, surgical, family and social history reviewed and updated as indicated. Interim medical history since our last visit reviewed. Allergies and medications reviewed and updated. Current Outpatient Prescriptions on File Prior to Visit  Medication Sig  . aspirin EC 81 MG tablet Take 81 mg by mouth every Monday, Wednesday, and Friday.  . Calcium Carb-Cholecalciferol (CALCIUM-VITAMIN D) 600-400 MG-UNIT TABS Take 1 tablet by mouth 2 (two) times daily.  . cholecalciferol (VITAMIN D) 1000 UNITS tablet Take 1,000 Units by mouth daily.  Marland Kitchen Cod Liver Oil 1000 MG CAPS Take 1 capsule by mouth daily.  Marland Kitchen LORazepam (ATIVAN) 0.5 MG tablet TAKE 1 TABLET BY MOUTH TWICE DAILY AS NEEDED  . Multiple Vitamin (MULTIVITAMIN) tablet Take 1 tablet by mouth daily.  . Omega-3 Fatty Acids (FISH OIL) 1000 MG CAPS Take 1 capsule by mouth daily.  Marland Kitchen PARoxetine (PAXIL) 20 MG tablet Take 1 tablet (20 mg total) by mouth daily.  . vitamin E 200 UNIT capsule Take 400 Units by mouth daily.   No current facility-administered medications on file prior to visit.     Review of Systems Per HPI unless specifically indicated in ROS section     Objective:    BP 120/72   Pulse 65   Wt 163 lb (73.9 kg)   SpO2 94%   BMI 29.10 kg/m   Wt Readings from Last 3 Encounters:  03/21/16 163 lb (73.9 kg)  02/08/16 164 lb (74.4 kg)  12/11/15 166 lb 12.8  oz (75.7 kg)    Physical Exam  Constitutional: She appears well-developed and well-nourished. No distress.  HENT:  Head: Normocephalic and atraumatic.  Right Ear: Hearing, tympanic membrane, external ear and ear canal normal.  Left Ear: Hearing, tympanic membrane, external ear and ear canal normal.  Nose: Mucosal edema and rhinorrhea present.  Mouth/Throat: Uvula is midline and mucous membranes are normal. Posterior oropharyngeal erythema (mild) present. No oropharyngeal exudate, posterior oropharyngeal edema or tonsillar abscesses.  Eyes: Conjunctivae and EOM are normal. Pupils are equal, round, and reactive to light. No scleral icterus.  Neck: Normal range of motion. Neck supple.  Lymphadenopathy:    She has no cervical adenopathy.  Skin: Skin is warm and dry. No rash noted.  Nursing note and vitals reviewed.  Results for orders placed or performed in visit on 02/18/16  HM MAMMOGRAPHY  Result Value Ref Range   HM Mammogram 0-4 Bi-Rad 0-4 Bi-Rad, Self Reported Normal   Lab Results  Component Value Date   CREATININE 1.17 02/01/2016       Assessment & Plan:   Problem List Items Addressed This Visit    Snoring - Primary    Without significant concerns for OSA - ESS score of 1-2. Anticipate more allergic congestion related. rec nasal strips, nasal saline. If no better, trial flonase (Rx printed  today). Pt agrees with plan.          Follow up plan: Return if symptoms worsen or fail to improve.  Ria Bush, MD

## 2016-03-21 NOTE — Patient Instructions (Addendum)
Try breathe-right nasal strips Use nasal saline spray If no better with this, you can try nasal steroid (Rx printed out today).

## 2016-03-30 ENCOUNTER — Other Ambulatory Visit: Payer: Self-pay | Admitting: Family Medicine

## 2016-04-07 ENCOUNTER — Telehealth: Payer: Self-pay

## 2016-04-07 NOTE — Telephone Encounter (Signed)
Pt left vm that previously Dr Darnell Level had advised pt to stop drinking Pepsi. Pt does not want to drink water all the time. Pt wants to know if she can have any Pepsis at all and if so how many a day she can have. Pt request cb.

## 2016-04-08 ENCOUNTER — Other Ambulatory Visit: Payer: Self-pay | Admitting: Family Medicine

## 2016-04-08 NOTE — Telephone Encounter (Signed)
Recommend regular water intake as healthiest liquid, along with milk for calcium. May drink soda sparingly (and if she drinks soda would prefer she drink clear sodas like ginger ale or sprite).  These are my recommendations - she can use her criteria and decide how much soda she drinks.

## 2016-04-08 NOTE — Telephone Encounter (Signed)
Patient notified as instructed by telephone and verbalized understanding. 

## 2016-04-09 NOTE — Telephone Encounter (Signed)
Ok to refill? Last filled 03/17/16 #50 0RF

## 2016-04-10 NOTE — Telephone Encounter (Signed)
Pt left v/m requesting status of lorazepam refill; pt will be out of med 04/11/16. Pt request cb when completed.

## 2016-04-10 NOTE — Telephone Encounter (Signed)
plz phone in. 

## 2016-04-10 NOTE — Telephone Encounter (Signed)
Rx called in as directed.   

## 2016-04-30 ENCOUNTER — Ambulatory Visit: Payer: Medicare Other | Admitting: Primary Care

## 2016-05-07 ENCOUNTER — Other Ambulatory Visit: Payer: Self-pay | Admitting: Family Medicine

## 2016-05-07 NOTE — Telephone Encounter (Signed)
Rx called in as directed.   

## 2016-05-07 NOTE — Telephone Encounter (Signed)
plz phone in. 

## 2016-05-07 NOTE — Telephone Encounter (Signed)
Ok to refill? Last filled 04/10/16 #50 0RF

## 2016-05-12 ENCOUNTER — Encounter: Payer: Self-pay | Admitting: Family Medicine

## 2016-05-12 ENCOUNTER — Ambulatory Visit (INDEPENDENT_AMBULATORY_CARE_PROVIDER_SITE_OTHER): Payer: Medicare Other | Admitting: Family Medicine

## 2016-05-12 DIAGNOSIS — F4323 Adjustment disorder with mixed anxiety and depressed mood: Secondary | ICD-10-CM

## 2016-05-12 NOTE — Patient Instructions (Addendum)
Try cutting down some on lorazepam, but don't fully stop suddenly. 1/2-1 tablet at a time as needed.  Continue current medicines.  Ok to continue [employer].  Return as needed or in 3 months for follow up visit.

## 2016-05-12 NOTE — Assessment & Plan Note (Signed)
Residual muscle aches consistent with trap tightness/spasm - reassured. Pt declines muscle relaxant at this time.

## 2016-05-12 NOTE — Progress Notes (Signed)
Pre visit review using our clinic review tool, if applicable. No additional management support is needed unless otherwise documented below in the visit note. 

## 2016-05-12 NOTE — Assessment & Plan Note (Addendum)
Continue paxil 20mg  daily. Continue ativan 0.5mg  BID PRN. Discussed benzos and possible dependence/tolerance and habit forming nature of med. However they have been very helpful for her anxiety. Discussed trial 1/2 tab at a time to see if effective.
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
## 2016-05-15 ENCOUNTER — Telehealth: Payer: Self-pay
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
## 2016-05-16 NOTE — Telephone Encounter (Signed)
Patient notified and declined muscle relaxer at this time. She will use heating pad and tylenol as needed.

## 2016-06-12 ENCOUNTER — Other Ambulatory Visit: Payer: Self-pay | Admitting: Family Medicine

## 2016-06-12 NOTE — Telephone Encounter (Signed)
Rx called in as directed.   

## 2016-06-12 NOTE — Telephone Encounter (Signed)
Ok to refill? Last filled 05/07/16 #50 0RF

## 2016-06-12 NOTE — Telephone Encounter (Signed)
plz phone in. 

## 2016-07-11 ENCOUNTER — Telehealth: Payer: Self-pay

## 2016-07-11 NOTE — Telephone Encounter (Signed)
Shingles is contagious to contact - esp if fluid filled blisters present. As blisters dry out they become less contagious. Shoul do ok as long as daughter keeps shingles rash covered.

## 2016-07-11 NOTE — Telephone Encounter (Signed)
Pt left v/m wanting to know if shingles are contagious; pts daughter has shingles and pt is supposed to go somewhere with her daughter and wants to know if safe to go and not get shingles.Please advise.

## 2016-07-14 ENCOUNTER — Other Ambulatory Visit: Payer: Self-pay | Admitting: Family Medicine

## 2016-07-14 NOTE — Telephone Encounter (Signed)
Message left advising patient.  

## 2016-07-15 ENCOUNTER — Telehealth: Payer: Self-pay | Admitting: Family Medicine

## 2016-07-15 ENCOUNTER — Telehealth: Payer: Self-pay

## 2016-07-15 NOTE — Telephone Encounter (Signed)
Ok to refill? Last filled 06/12/16 #50 0RF

## 2016-07-15 NOTE — Telephone Encounter (Signed)
Pt left v/m; pt had bone density 01/16/2013 and pt wants to know when should have next bone density. Pt request cb.

## 2016-07-15 NOTE — Telephone Encounter (Signed)
plz phone in. 

## 2016-07-16 NOTE — Telephone Encounter (Addendum)
Patient is calling asking about her refill.  Please call patient back at [phone]. Patient is out of medication.

## 2016-07-16 NOTE — Telephone Encounter (Signed)
Rx called in as directed.   

## 2016-07-16 NOTE — Telephone Encounter (Signed)
Rx called in and message left notifying patient.

## 2016-07-17 NOTE — Telephone Encounter (Signed)
Pt notified of Dr. Synthia Innocent comments and instructions and verbalized understanding

## 2016-07-17 NOTE — Telephone Encounter (Signed)
Recommend next DEXA scan 2019.  In interim, continue calcium/vit D, and regular weight bearing exercise (ie walking)

## 2016-07-30 ENCOUNTER — Ambulatory Visit (INDEPENDENT_AMBULATORY_CARE_PROVIDER_SITE_OTHER): Payer: Medicare Other | Admitting: Internal Medicine

## 2016-07-30 ENCOUNTER — Ambulatory Visit: Payer: Medicare Other | Admitting: Primary Care

## 2016-07-30 ENCOUNTER — Encounter: Payer: Self-pay | Admitting: Internal Medicine

## 2016-07-30 ENCOUNTER — Telehealth: Payer: Self-pay

## 2016-07-30 VITALS — BP 112/64 | HR 88 | Temp 98.1°F | Wt 163.5 lb

## 2016-07-30 DIAGNOSIS — Z23 Encounter for immunization: Secondary | ICD-10-CM

## 2016-07-30 DIAGNOSIS — W5321XA Bitten by squirrel, initial encounter: Secondary | ICD-10-CM | POA: Diagnosis not present

## 2016-07-30 DIAGNOSIS — L03011 Cellulitis of right finger: Secondary | ICD-10-CM | POA: Diagnosis not present

## 2016-07-30 MED ORDER — CEFTRIAXONE SODIUM 1 G IJ SOLR
1.0000 g | Freq: Once | INTRAMUSCULAR | Status: AC
  Administered 2016-07-30: 1 g via INTRAMUSCULAR

## 2016-07-30 MED ORDER — AMOXICILLIN-POT CLAVULANATE 875-125 MG PO TABS: 1.0000 | ORAL_TABLET | Freq: Two times a day (BID) | ORAL | 0 refills | Status: DC
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
## 2016-07-30 NOTE — Addendum Note (Signed)
Addended by: Pilar Grammes on: 07/30/2016 01:00 PM   Modules accepted: Orders

## 2016-07-30 NOTE — Telephone Encounter (Signed)
Pt has appt with Allie Bossier NP 07/30/16 at 12:15.

## 2016-07-30 NOTE — Telephone Encounter (Signed)
Noted  

## 2016-07-30 NOTE — Telephone Encounter (Signed)
PLEASE NOTE: All timestamps contained within this report are represented as [place] Standard Time. CONFIDENTIALTY NOTICE: This fax transmission is intended only for the addressee. It contains information that is legally privileged, confidential or otherwise protected from use or disclosure. If you are not the intended recipient, you are strictly prohibited from reviewing, disclosing, copying using or disseminating any of this information or taking any action in reliance on or regarding this information. If you have received this fax in error, please notify us immediately by telephone so that we can arrange for its return to [place]. Phone: [phone], Toll-Free: [phone], Fax: [phone] Page: 1 of 1 Call Id: [id number] [place] Patient Name: Karen [name] Gender: Female DOB: 03-17-1946 Age: 71 Y 80 M 21 D Return Phone Number: [phone] (Primary), [phone] (Secondary) City/State/Zip: [place] [address] Client Brogan Night - Client Client Site [place] Physician Ria Bush - MD Who Is Calling Patient / Member / Family / Caregiver Call Type Triage / Clinical Relationship To Patient Self Return Phone Number [phone] (Secondary) Chief Complaint Animal Bite Reason for Call Symptomatic / Request for [place] states that she was bitten by a squirrel on her thumb while trying to get the squirrel to safety from her dog. Caller states that she has put alcohol and peroxide on the bite and bandaged it. Nurse Assessment Nurse: Ysidro Evert, RN, Levada Dy Date/Time (Eastern Time): 07/29/2016 7:52:25 PM Confirm and document reason for call. If symptomatic, describe symptoms. ---Caller states she bitten by a squirrel. It broke the skin Does the PT have any chronic conditions? (i.e. diabetes, asthma,  etc.) ---No Guidelines Guideline Title Affirmed Question Animal Bite [1] Any break in skin (e.g., cut, puncture or scratch) AND [2] last tetanus shot > 5 years ago Disp. Time [name] Time) Disposition Final User 07/29/2016 7:58:44 PM See PCP When Office is Open (within 3 days) Yes Ysidro Evert, RN, [facility] Advice Given Per Guideline SEE PCP WITHIN 3 DAYS: * You need to be seen within 2 or 3 days. Call your doctor during regular office hours and make an appointment. An urgent care center is often the best source of care if your doctor's office is closed or you can't get an appointment. NOTE: If office will be open tomorrow, tell caller to call then, not in 3 days. TETANUS SHOT: * You will probably need a tetanus booster shot. * Try to get it within 3 days. CLEANSE THE WOUND: * Wash all minor wounds immediately with soap and water for 5 minutes. * Flush vigorously under faucet (Reason: can prevent many wound infections). * Scrub wound enough to make it re-bleed a little (Reason: to help with cleaning out the wound). APPLY ANTIBIOTIC OINTMENT: Apply an antibiotic ointment (OTC) to the wound three times a day for 3 days. RABIES RISK - SMALL RODENTS AND RABBITS: * Small rodents (e.g., squirrels, chipmunks, rats, mice, hamsters, [place] pigs, and gerbils) have not been known to transmit rabies to humans. REPORTING WILD ANIMAL BITES: You should report this to the local public health department or animal control center. CALL BACK IF: * Fever occurs * Wound begins to look infected (pus, redness, red streaks) * You become worse. CARE ADVICE given per Animal Bite (Adult) guideline.

## 2016-07-30 NOTE — Patient Instructions (Signed)
Animal Bite Animal bite wounds can get infected. It is important to get proper medical treatment. Ask your doctor if you need rabies treatment. Follow these instructions at home: Wound care   Follow instructions from your doctor about how to take care of your wound. Make sure you:  Wash your hands with soap and water before you change your bandage (dressing). If you cannot use soap and water, use hand sanitizer.  Change your bandage as told by your doctor.  Leave stitches (sutures), skin glue, or skin tape (adhesive) strips in place. They may need to stay in place for 2 weeks or longer. If tape strips get loose and curl up, you may trim the loose edges. Do not remove tape strips completely unless your doctor says it is okay.  Check your wound every day for signs of infection. Watch for:  Redness, swelling, or pain that gets worse.  Fluid, blood, or pus. General instructions   Take or apply over-the-counter and prescription medicines only as told by your doctor.  If you were prescribed an antibiotic, take or apply it as told by your doctor. Do not stop using the antibiotic even if your condition improves.  Keep the injured area raised (elevated) above the level of your heart while you are sitting or lying down.  If directed, apply ice to the injured area.  Put ice in a plastic bag.  Place a towel between your skin and the bag.  Leave the ice on for 20 minutes, 2-3 times per day.  Keep all follow-up visits as told by your doctor. This is important. Contact a doctor if:  You have redness, swelling, or pain that gets worse.  You have a general feeling of sickness (malaise).  You feel sick to your stomach (nauseous).  You throw up (vomit).  You have pain that does not get better. Get help right away if:  You have a red streak going away from your wound.  You have fluid, blood, or pus coming from your wound.  You have a fever or chills.  You have trouble moving your  injured area.  You have numbness or tingling anywhere on your body. This information is not intended to replace advice given to you by your health care provider. Make sure you discuss any questions you have with your health care provider. Document Released: 03/17/2005 Document Revised: 08/23/2015 Document Reviewed: 08/02/2014 Elsevier Interactive Patient Education  2017 Elsevier Inc.  

## 2016-08-01 ENCOUNTER — Telehealth: Payer: Self-pay

## 2016-08-01 NOTE — Telephone Encounter (Signed)
Pt said that the pharmacy let her know she had an abx to pick up and pt said she had not seen Dr Darnell Level for a while and why would she need to pick up an abx. I asked Hina if she was seen for a squirrel bite; she exclaimed yes I was and is that why I need to take an abx? I advised pt yes and pt assured me she would go pick up abx now. FYI to Dr Darnell Level.

## 2016-08-01 NOTE — Telephone Encounter (Signed)
Noted  

## 2016-08-04 ENCOUNTER — Telehealth: Payer: Self-pay | Admitting: *Deleted

## 2016-08-04 NOTE — Telephone Encounter (Signed)
[facility] Patient Name: [facility] Dykema Gender: Female DOB: 1945/12/15 Age: 71 Y 25 M 25 D Return Phone Number: [phone] (Primary), [phone] (Secondary) City/State/Zip: [place] [address] Client Boronda Night - Client Client Site [place] Physician Ria Bush - MD Who Is Calling Patient / Member / Family / Caregiver Call Type Triage / Clinical Relationship To Patient Self Return Phone Number [phone] (Secondary) Chief Complaint Medication reaction Reason for Call Symptomatic / Request for Health Information Initial Comment Caller was given amoxicillin for a squirrel bite & now has diarrhea. Wants to know to if she should keep taking it & if the abx is what is causing her diarrhea No Triage Reason Patient declined Nurse Assessment Nurse: Laurance Flatten, RN, Geni Bers Date/Time (Eastern Time): 08/02/2016 4:31:12 PM Confirm and document reason for call. If symptomatic, describe symptoms. ---Caller was given amoxicillin for a squirrel bite & now has diarrhea. Wants to know to if she should keep taking it & if the abx is what is causing her diarrhea. Select reason for no triage. ---Patient declined Please document clinical information provided and list any resource used. ---Informed caller that diarrhea is a common side effect of this medication. Instructed to continue taking antibiotic as instructed by her physician. Provided the following information: Drink enough fluids. To counter a mild loss of fluids from diarrhea, drink more water. For a more-severe loss, drink fluids that contain water, sugar and salt. Try broth or watered fruit juice. Avoid beverages that are high in sugar or contain alcohol or caffeine, such as coffee, tea and colas, which can worsen your symptoms. Choose soft, easy-to-digest foods. These  include applesauce, bananas and rice. Avoid high-fiber foods such as beans, nuts and vegetables. Once your symptoms resolve, your can return to your normal diet. Consider taking probiotics. Microorganisms such as acidophilus help restore a healthy balance to the intestinal tract by boosting the level of good bacteria. (Ref. WebMD) Guidelines Guideline Title Affirmed Question Disp. Time [name] Time) Disposition Final User 08/02/2016 4:38:59 PM Clinical Call Yes Laurance Flatten, RN, Geni Bers

## 2016-08-08 ENCOUNTER — Ambulatory Visit (INDEPENDENT_AMBULATORY_CARE_PROVIDER_SITE_OTHER): Payer: Medicare Other | Admitting: Family Medicine

## 2016-08-08 ENCOUNTER — Encounter: Payer: Self-pay | Admitting: Family Medicine

## 2016-08-08 ENCOUNTER — Encounter: Payer: Self-pay | Admitting: Radiology

## 2016-08-08 VITALS — BP 114/70 | HR 80 | Temp 98.1°F | Wt 163.5 lb

## 2016-08-08 DIAGNOSIS — B001 Herpesviral vesicular dermatitis: Secondary | ICD-10-CM | POA: Diagnosis not present

## 2016-08-08 DIAGNOSIS — F4323 Adjustment disorder with mixed anxiety and depressed mood: Secondary | ICD-10-CM | POA: Diagnosis not present

## 2016-08-08 DIAGNOSIS — N183 Chronic kidney disease, stage 3 unspecified: Secondary | ICD-10-CM

## 2016-08-08 LAB — RENAL FUNCTION PANEL
Albumin: 4 g/dL (ref 3.5–5.2)
BUN: 19 mg/dL (ref 6–23)
CO2: 28 mEq/L (ref 19–32)
Calcium: 9.2 mg/dL (ref 8.4–10.5)
Chloride: 106 mEq/L (ref 96–112)
Creatinine, Ser: 1.16 mg/dL (ref 0.40–1.20)
GFR: 48.89 mL/min — ABNORMAL LOW (ref >60.00)
Glucose, Bld: 196 mg/dL — ABNORMAL HIGH (ref 70–99)
Phosphorus: 3.7 mg/dL (ref 2.3–4.6)
Potassium: 3.8 mEq/L (ref 3.5–5.1)
Sodium: 141 mEq/L (ref 135–145)

## 2016-08-08 NOTE — Progress Notes (Signed)
BP 114/70 (BP Location: Left Arm, Patient Position: Sitting, Cuff Size: Normal)   Pulse 80   Temp 98.1 F (36.7 C) (Oral)   Wt 163 lb 8 oz (74.2 kg)   SpO2 97%   BMI 29.19 kg/m    CC: 3 mo f/u visit Subjective:    Patient ID: Carman Auxier, female    DOB: 04-24-1945, 71 y.o.   MRN: [id number]  HPI: Cale Decarolis is a 71 y.o. female presenting on 08/08/2016 for Follow-up (3 month f/u)   Recently seen here with squirrel bite treated with CTX and then augmentin course. Tdap was updated. She continues augmentin.   Avoids dark sodas. Drinks well water. Avoids NSAIDs.  Mood disorder/adjustment disorder - stable on paxil 20mg  daily with lorazepam 0.5mg  BID PRN #50.   Hemorrhoids are acting up.   Relevant past medical, surgical, family and social history reviewed and updated as indicated. Interim medical history since our last visit reviewed. Allergies and medications reviewed and updated. Outpatient Medications Prior to Visit  Medication Sig Dispense Refill  . amoxicillin-clavulanate (AUGMENTIN) 875-125 MG tablet Take 1 tablet by mouth 2 (two) times daily. 20 tablet 0  . aspirin EC 81 MG tablet Take 81 mg by mouth every Monday, Wednesday, and Friday.    . Calcium Carb-Cholecalciferol (CALCIUM-VITAMIN D) 600-400 MG-UNIT TABS Take 1 tablet by mouth 2 (two) times daily.    . cholecalciferol (VITAMIN D) 1000 UNITS tablet Take 1,000 Units by mouth daily.    Marland Kitchen Cod Liver Oil 1000 MG CAPS Take 1 capsule by mouth daily.    . fluticasone (FLONASE) 50 MCG/ACT nasal spray Place 2 sprays into both nostrils daily. 16 g 2  . loratadine (CLARITIN) 10 MG tablet Take 10 mg by mouth daily.    Marland Kitchen LORazepam (ATIVAN) 0.5 MG tablet TAKE 1 TABLET BY MOUTH TWICE DAILY AS NEEDED 50 tablet 0  . Multiple Vitamin (MULTIVITAMIN) tablet Take 1 tablet by mouth daily.    . Omega-3 Fatty Acids (FISH OIL) 1000 MG CAPS Take 1 capsule by mouth daily.    Marland Kitchen PARoxetine (PAXIL) 20 MG tablet TAKE 1 TABLET (20 MG TOTAL) BY  MOUTH DAILY. 90 tablet 1  . vitamin E 200 UNIT capsule Take 400 Units by mouth daily.     No facility-administered medications prior to visit.      Per HPI unless specifically indicated in ROS section below Review of Systems     Objective:    BP 114/70 (BP Location: Left Arm, Patient Position: Sitting, Cuff Size: Normal)   Pulse 80   Temp 98.1 F (36.7 C) (Oral)   Wt 163 lb 8 oz (74.2 kg)   SpO2 97%   BMI 29.19 kg/m   Wt Readings from Last 3 Encounters:  08/08/16 163 lb 8 oz (74.2 kg)  07/30/16 163 lb 8 oz (74.2 kg)  05/12/16 161 lb 12 oz (73.4 kg)    Physical Exam  Constitutional: She appears well-developed and well-nourished. No distress.  HENT:  Mouth/Throat: Oropharynx is clear and moist. No oropharyngeal exudate.  Neck: Normal range of motion. Neck supple.  Cardiovascular: Normal rate, regular rhythm, normal heart sounds and intact distal pulses.   No murmur heard. Pulmonary/Chest: Effort normal and breath sounds normal. No respiratory distress. She has no wheezes. She has no rales.  Musculoskeletal: She exhibits no edema.  Lymphadenopathy:    She has no cervical adenopathy.  Skin: Skin is warm and dry. No rash noted.  Psychiatric: She has a normal mood and  affect.  Nursing note and vitals reviewed.  Results for orders placed or performed in visit on 02/18/16  HM MAMMOGRAPHY  Result Value Ref Range   HM Mammogram 0-4 Bi-Rad 0-4 Bi-Rad, Self Reported Normal      Assessment & Plan:   Problem List Items Addressed This Visit    Adjustment disorder with mixed anxiety and depressed mood    Chronic, stable. Continue current regimen of paxil 20mg  daily, lorazepam 0.5mg  am/0.25mg  pm.  Update UDS today.       CKD (chronic kidney disease) stage 3, GFR 30-59 ml/min - Primary    Stable period. She has made conscious effort to stop dark sodas. Avoids NSAIDs, stays well hydrated. Update labs today.       Relevant Orders   Renal function panel   Fever blister     Not bothersome. Declines further management.           Follow up plan: Return in about 3 months (around 11/08/2016), or if symptoms worsen or fail to improve, for follow up visit.  Ria Bush, MD

## 2016-08-08 NOTE — Patient Instructions (Addendum)
Labs today.  Update urine drug screen today.  Continue paxil 20mg  daily and lorazepam 1/2-1 tablet twice daily as needed.  You are doing well today.  Return as needed or in 3 months for follow up visit.  Wear sunscreen when you're outside.  May use OTC cortisone-10 cream to left shoulder.

## 2016-08-08 NOTE — Assessment & Plan Note (Addendum)
Chronic, stable. Continue current regimen of paxil 20mg  daily, lorazepam 0.5mg  am/0.25mg  pm.  Update UDS today.

## 2016-08-08 NOTE — Assessment & Plan Note (Addendum)
Stable period. She has made conscious effort to stop dark sodas. Avoids NSAIDs, stays well hydrated. Update labs today.

## 2016-08-08 NOTE — Assessment & Plan Note (Signed)
Not bothersome. Declines further management.

## 2016-08-11 ENCOUNTER — Encounter: Payer: Self-pay | Admitting: *Deleted

## 2016-08-11 ENCOUNTER — Encounter: Payer: Self-pay | Admitting: Family Medicine

## 2016-08-13 ENCOUNTER — Other Ambulatory Visit: Payer: Self-pay | Admitting: *Deleted

## 2016-08-13 NOTE — Telephone Encounter (Signed)
Last office visit 08/08/2016.  Last refilled 07/15/2016 for #50 with no refills.  Ok to refill?

## 2016-08-14 MED ORDER — LORAZEPAM 0.5 MG PO TABS: 0.5000 mg | ORAL_TABLET | Freq: Two times a day (BID) | ORAL | 0 refills | Status: DC | PRN

## 2016-08-14 NOTE — Telephone Encounter (Signed)
Lorazepam called into [id number] - [place], [place] - [address] Phone: [phone] 

## 2016-08-14 NOTE — Telephone Encounter (Signed)
plz phone in. 

## 2016-09-06 ENCOUNTER — Other Ambulatory Visit: Payer: Self-pay | Admitting: Family Medicine

## 2016-09-08 ENCOUNTER — Other Ambulatory Visit: Payer: Self-pay | Admitting: Family Medicine

## 2016-09-08 NOTE — Telephone Encounter (Signed)
plz phone in. 

## 2016-09-08 NOTE — Telephone Encounter (Signed)
Medication called in per 09/06/16 phone note.

## 2016-09-08 NOTE — Telephone Encounter (Signed)
Pt left v/m requesting status of lorazepam to [facility] [place]. Left v/m for pt to ck with pharmacy in 2 hrs. Medication phoned to Vicente Males at [facility] as instructed.

## 2016-09-08 NOTE — Telephone Encounter (Signed)
Rx called to pharmacy, left on voicemail.

## 2016-09-08 NOTE — Telephone Encounter (Signed)
OK to refill

## 2016-09-18 ENCOUNTER — Telehealth: Payer: Self-pay

## 2016-09-18 NOTE — Telephone Encounter (Signed)
Pt was sitting at desk looking at iphone; sometime after that pt developed low back pain; hurts whether moving or not; if sitting still has dull pain now 2 - 3 pain level. No UTI symptoms; pt has taken 3 baby ASA that helped slightly. Dr Darnell Level said tonight pt can take Tylenol 500 mg 1-2 tabs po bid prn, heating pad at 15 min intervals at a time and not on direct skin; gentle stretching. If pt still having problem on 09/19/16 pt to call to be seen . Pt advised as instructed and voiced understanding. ptwill only see Dr Darnell Level and Dr Darnell Level only had one appt left for 09/19/16. Scheduled pt for Dr Darnell Level on 09/19/16 at 3 pm, If in AM pt does not have any pain she will call and cancel appt but if still pain she will keep appt. FYI to Dr Darnell Level.

## 2016-09-19 ENCOUNTER — Ambulatory Visit: Payer: Medicare Other | Admitting: Family Medicine

## 2016-09-19 ENCOUNTER — Telehealth: Payer: Self-pay | Admitting: Family Medicine

## 2016-09-19 NOTE — Telephone Encounter (Signed)
Patient did not come in for their appointment today for back pain Please let me know if patient needs to be contacted immediately for follow up or no follow up needed. Do you want to charge the NSF?

## 2016-09-19 NOTE — Telephone Encounter (Signed)
No f/u needed.  Pt no showed.  Will charge.

## 2016-09-22 ENCOUNTER — Ambulatory Visit (INDEPENDENT_AMBULATORY_CARE_PROVIDER_SITE_OTHER)
Admission: RE | Admit: 2016-09-22 | Discharge: 2016-09-22 | Disposition: A | Discharge status: Home / Self Care | Payer: Medicare Other | Source: Ambulatory Visit | Attending: Family Medicine | Admitting: Family Medicine

## 2016-09-22 ENCOUNTER — Ambulatory Visit (INDEPENDENT_AMBULATORY_CARE_PROVIDER_SITE_OTHER): Payer: Medicare Other | Admitting: Family Medicine

## 2016-09-22 ENCOUNTER — Encounter: Payer: Self-pay | Admitting: Family Medicine

## 2016-09-22 VITALS — BP 120/72 | HR 70 | Temp 98.3°F | Wt 160.5 lb

## 2016-09-22 DIAGNOSIS — M545 Low back pain: Secondary | ICD-10-CM | POA: Diagnosis not present
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
## 2016-09-22 NOTE — Patient Instructions (Signed)
Go to the lab on the way out.  We'll contact you with your xray report. Keep taking tylenol and use ice in the meantime.  Take care.  Glad to see you.

## 2016-09-23 ENCOUNTER — Telehealth: Payer: Self-pay | Admitting: Family Medicine

## 2016-09-23 DIAGNOSIS — M545 Low back pain, unspecified: Secondary | ICD-10-CM | POA: Insufficient documentation

## 2016-09-23 NOTE — Telephone Encounter (Signed)
Noted. Thank you.  Can we cancel last week's no show fee? Thank you.
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
## 2016-09-23 NOTE — Telephone Encounter (Signed)
Patient called to let Dr.G know she forgot about her appointment with him last week.  She didn't write the appointment down and she was very busy that day.  Patient said she would have like to have seen Dr.G yesterday, but he wasn't available.  She wanted to apologize for missing the appointment.

## 2016-09-27 ENCOUNTER — Other Ambulatory Visit: Payer: Self-pay | Admitting: Family Medicine

## 2016-10-02 ENCOUNTER — Other Ambulatory Visit: Payer: Self-pay | Admitting: Family Medicine

## 2016-10-03 NOTE — Telephone Encounter (Signed)
Canceled no show fee / lt

## 2016-10-03 NOTE — Telephone Encounter (Signed)
Plz phone in

## 2016-10-03 NOTE — Telephone Encounter (Signed)
Rx called in to requested pharmacy 

## 2016-10-23 ENCOUNTER — Ambulatory Visit: Payer: Medicare Other | Admitting: Internal Medicine

## 2016-10-24 ENCOUNTER — Ambulatory Visit: Payer: Medicare Other | Admitting: Family Medicine

## 2016-10-28 ENCOUNTER — Other Ambulatory Visit: Payer: Self-pay | Admitting: Family Medicine

## 2016-10-28 NOTE — Telephone Encounter (Signed)
Received refill electronically Last refill 10/03/16 #50 Last office visit 09/22/16/acute

## 2016-10-28 NOTE — Telephone Encounter (Signed)
plz phone in. 

## 2016-10-28 NOTE — Telephone Encounter (Signed)
Done

## 2016-10-29 ENCOUNTER — Other Ambulatory Visit: Payer: Self-pay | Admitting: Family Medicine

## 2016-10-29 DIAGNOSIS — Z1231 Encounter for screening mammogram for malignant neoplasm of breast: Secondary | ICD-10-CM

## 2016-11-07 ENCOUNTER — Ambulatory Visit (INDEPENDENT_AMBULATORY_CARE_PROVIDER_SITE_OTHER): Payer: Medicare Other | Admitting: Family Medicine

## 2016-11-07 ENCOUNTER — Encounter: Payer: Self-pay | Admitting: Family Medicine

## 2016-11-07 VITALS — BP 104/74 | HR 62 | Temp 98.2°F | Resp 16 | Wt 163.8 lb

## 2016-11-07 DIAGNOSIS — F4323 Adjustment disorder with mixed anxiety and depressed mood: Secondary | ICD-10-CM

## 2016-11-07 DIAGNOSIS — K649 Unspecified hemorrhoids: Secondary | ICD-10-CM | POA: Diagnosis not present

## 2016-11-07 DIAGNOSIS — N183 Chronic kidney disease, stage 3 unspecified: Secondary | ICD-10-CM

## 2016-11-07 NOTE — Assessment & Plan Note (Signed)
Stable period. Update labs next visit.

## 2016-11-07 NOTE — Progress Notes (Signed)
BP 104/74   Pulse 62   Temp 98.2 F (36.8 C) (Oral)   Resp 16   Wt 163 lb 12.8 oz (74.3 kg)   SpO2 95%   BMI 29.25 kg/m    CC: 3 mo f/u visit Subjective:    Patient ID: Karen [name], female    DOB: 06-21-1945, 71 y.o.   MRN: [id number]  HPI: Karen [name] is a 71 y.o. female presenting on 11/07/2016 for Chronic Kidney Disease and Hemorrhoids (would like an Rx for her hemorrhoids )   LBP has improved - thinks exacerbated by increased activity at [employer].   Mood disorder/adjustment disorder - stable on paxil 20mg  daily, lorazepam 0.5mg  bid PRN #50/month.   CKD - avoids dark sodas, drinks water. Avoids NSAIDs.   Hemorrhoids - not currently flaring. Possible internal. Some blood with wiping. Prep H helping some. Declines further evaluation.   Relevant past medical, surgical, family and social history reviewed and updated as indicated. Interim medical history since our last visit reviewed. Allergies and medications reviewed and updated. Outpatient Medications Prior to Visit  Medication Sig Dispense Refill  . aspirin EC 81 MG tablet Take 81 mg by mouth every Monday, Wednesday, and Friday.    . Calcium Carb-Cholecalciferol (CALCIUM-VITAMIN D) 600-400 MG-UNIT TABS Take 1 tablet by mouth 2 (two) times daily.    . cholecalciferol (VITAMIN D) 1000 UNITS tablet Take 1,000 Units by mouth daily.    Marland Kitchen Cod Liver Oil 1000 MG CAPS Take 1 capsule by mouth daily.    . fluticasone (FLONASE) 50 MCG/ACT nasal spray Place 2 sprays into both nostrils daily. 16 g 2  . loratadine (CLARITIN) 10 MG tablet Take 10 mg by mouth daily.    Marland Kitchen LORazepam (ATIVAN) 0.5 MG tablet TAKE 1 TABLET BY MOUTH TWICE DAILY AS NEEDED 50 tablet 0  . Multiple Vitamin (MULTIVITAMIN) tablet Take 1 tablet by mouth daily.    . Omega-3 Fatty Acids (FISH OIL) 1000 MG CAPS Take 1 capsule by mouth daily.    Marland Kitchen PARoxetine (PAXIL) 20 MG tablet TAKE 1 TABLET (20 MG TOTAL) BY MOUTH DAILY. 90 tablet 1  . vitamin E 200 UNIT capsule  Take 400 Units by mouth daily.     No facility-administered medications prior to visit.      Per HPI unless specifically indicated in ROS section below Review of Systems     Objective:    BP 104/74   Pulse 62   Temp 98.2 F (36.8 C) (Oral)   Resp 16   Wt 163 lb 12.8 oz (74.3 kg)   SpO2 95%   BMI 29.25 kg/m   Wt Readings from Last 3 Encounters:  11/07/16 163 lb 12.8 oz (74.3 kg)  09/22/16 160 lb 8 oz (72.8 kg)  08/08/16 163 lb 8 oz (74.2 kg)    Physical Exam  Constitutional: She appears well-developed and well-nourished. No distress.  Psychiatric: She has a normal mood and affect.  Nursing note and vitals reviewed.  Results for orders placed or performed in visit on 08/08/16  Renal function panel  Result Value Ref Range   Sodium 141 135 - 145 mEq/L   Potassium 3.8 3.5 - 5.1 mEq/L   Chloride 106 96 - 112 mEq/L   CO2 28 19 - 32 mEq/L   Calcium 9.2 8.4 - 10.5 mg/dL   Albumin 4.0 3.5 - 5.2 g/dL   BUN 19 6 - 23 mg/dL   Creatinine, Ser 1.16 0.40 - 1.20 mg/dL   Glucose, Bld  196 (H) 70 - 99 mg/dL   Phosphorus 3.7 2.3 - 4.6 mg/dL   GFR 48.89 (L) >60.00 mL/min      Assessment & Plan:   Problem List Items Addressed This Visit    Adjustment disorder with mixed anxiety and depressed mood    Chronic, stable period. Continue current regimen.       CKD (chronic kidney disease) stage 3, GFR 30-59 ml/min - Primary    Stable period. Update labs next visit.       Hemorrhoid    Sounds like internal prolapsing. Pt declines exam today. Desires to continue prep H.           Follow up plan: Return in about 3 months (around 02/07/2017) for annual exam, prior fasting for blood work, medicare wellness visit.  Ria Bush, MD

## 2016-11-07 NOTE — Assessment & Plan Note (Signed)
Chronic, stable period. Continue current regimen.  

## 2016-11-07 NOTE — Patient Instructions (Addendum)
You are doing well today. Continue current medicines.  Return in 3 months for medicare wellness visit with Katha Cabal and follow up physical with me.

## 2016-11-07 NOTE — Assessment & Plan Note (Signed)
Sounds like internal prolapsing. Pt declines exam today. Desires to continue prep H.

## 2016-11-21 ENCOUNTER — Other Ambulatory Visit: Payer: Self-pay | Admitting: Family Medicine

## 2016-11-24 ENCOUNTER — Telehealth: Payer: Self-pay | Admitting: *Deleted

## 2016-11-24 MED ORDER — LORAZEPAM 0.5 MG PO TABS: 0.5000 mg | ORAL_TABLET | Freq: Two times a day (BID) | ORAL | 0 refills | Status: DC | PRN

## 2016-11-24 NOTE — Telephone Encounter (Signed)
plz notify this was phoned in. I phoned it in today.  Is she regularly taking 2 tablets daily? If so, will need to increase # to 60. My understanding was she was taking 2nd dose only as needed for anxiety

## 2016-11-24 NOTE — Telephone Encounter (Signed)
Patient said she's out of her medication. Patient can be reached at [phone].

## 2016-11-24 NOTE — Telephone Encounter (Signed)
Patient left a voicemail stating that she had requested a refill on her Lorazepam from her pharmacy and it was denied. Patient requested a call back as to why? Patient stated that she only has one pill left.  Last refill 10/28/16 #50 Last office visit 11/07/16

## 2016-11-25 NOTE — Telephone Encounter (Signed)
Patient said she has been taking 2 tablets a day.  She said yesterday she only took 1.

## 2016-11-25 NOTE — Telephone Encounter (Signed)
Lm on pts vm requesting a call back; advised Rx sent to pharmacy

## 2016-12-10 ENCOUNTER — Telehealth: Payer: Self-pay | Admitting: Family Medicine

## 2016-12-10 NOTE — Telephone Encounter (Signed)
Noted. If she was previously regularly taking twice daily, would suggest she decrease to once daily or every other day for 1 wk then may stop.

## 2016-12-10 NOTE — Telephone Encounter (Signed)
Caller Name:Latria Palecek Relationship to Patient:self Best [phone] Pharmacy:  Reason for call:  Pt calling to let us know that she has discontinued Lorazepam.  She has had 1 pil in the last 2 days and states she is doing very well.

## 2016-12-11 NOTE — Telephone Encounter (Signed)
Attempted to contact pt. No answer

## 2016-12-11 NOTE — Telephone Encounter (Signed)
Pt returned call - also wants to know if lorazepam can affect memory.   She took a piece of 0.5 mg pill this morning.   cb number is [phone]

## 2016-12-11 NOTE — Telephone Encounter (Signed)
Tried to call pt . No answer.  

## 2016-12-12 NOTE — Telephone Encounter (Signed)
Yes benzodiazepines can affect memory.

## 2016-12-12 NOTE — Telephone Encounter (Signed)
Spoke with pt, relayed message per Dr. Darnell Level, referring to memory question and instructions.

## 2016-12-23 NOTE — Telephone Encounter (Signed)
PT called to let you know she is doing well and does not need anything called in for anxiety at this point. She apologized.

## 2017-01-02 ENCOUNTER — Other Ambulatory Visit: Payer: Self-pay

## 2017-01-02 MED ORDER — LORAZEPAM 0.5 MG PO TABS: 0.5000 mg | ORAL_TABLET | Freq: Every day | ORAL | 0 refills | Status: DC | PRN

## 2017-01-02 NOTE — Telephone Encounter (Signed)
Last phoned in:  10/30/16, #50 Last OV: 11/07/16 Next OV:  02/17/17  Pt is asking how much effect does this med have on memory.

## 2017-01-02 NOTE — Telephone Encounter (Signed)
Left message on vm per dpr relaying message per Dr. G.  

## 2017-01-02 NOTE — Telephone Encounter (Signed)
longterm use can affect memory We can refill but I recommend she only use as needed for anxiety, not daily.  Changed sig, have sent in lower # this month.

## 2017-01-06 ENCOUNTER — Ambulatory Visit (INDEPENDENT_AMBULATORY_CARE_PROVIDER_SITE_OTHER): Payer: Medicare Other | Admitting: Family Medicine

## 2017-01-06 ENCOUNTER — Encounter: Payer: Self-pay | Admitting: Family Medicine

## 2017-01-06 VITALS — BP 120/70 | HR 78 | Temp 97.6°F | Wt 161.0 lb

## 2017-01-06 DIAGNOSIS — J069 Acute upper respiratory infection, unspecified: Secondary | ICD-10-CM | POA: Diagnosis not present

## 2017-01-06 DIAGNOSIS — Z23 Encounter for immunization: Secondary | ICD-10-CM | POA: Diagnosis not present

## 2017-01-06 NOTE — Patient Instructions (Signed)
Ok to do flu shot today I do think you have viral upper respiratory infection.  Restart flonase and claritin. Push fluids and rest.  You can use delsym over the counter for cough suppressant.  This should improve over next 3-5 days.  Let us know if fever >101, or worsening productive cough or worsening instead of improving.

## 2017-01-06 NOTE — Addendum Note (Signed)
Addended by: Brenton Grills on: 87/07/7970 82:06 PM   Modules accepted: Orders

## 2017-01-06 NOTE — Assessment & Plan Note (Signed)
Anticipate viral URI given short duration. Supportive care reviewed as per patient instructions. Update if not improving with treatment.  Ok for flu shot today.

## 2017-01-06 NOTE — Progress Notes (Signed)
BP 120/70 (BP Location: Left Arm, Patient Position: Sitting, Cuff Size: Normal)   Pulse 78   Temp 97.6 F (36.4 C) (Oral)   Wt 161 lb (73 kg)   SpO2 99%   BMI 28.75 kg/m    CC: congestion Subjective:    Patient ID: [name], female    DOB: [birth year], 71 y.o.   MRN: [id number]  HPI: Lauris Keepers is a 71 y.o. female presenting on 01/06/2017 for Hoarse (Also, c/o chest congestion. Coughing up discolored mucous. Started a few days ago)   1 wk h/o congestion "probably allergies", comes in today because she noticed some colored mucous. Hoarse.   No fever, ST, ear or tooth pain, headaches or facial pain.  She is not taking claritin or flonase.   Completely off lorazepam.   Relevant past medical, surgical, family and social history reviewed and updated as indicated. Interim medical history since our last visit reviewed. Allergies and medications reviewed and updated. Outpatient Medications Prior to Visit  Medication Sig Dispense Refill  . aspirin EC 81 MG tablet Take 81 mg by mouth every Monday, Wednesday, and Friday.    . Calcium Carb-Cholecalciferol (CALCIUM-VITAMIN D) 600-400 MG-UNIT TABS Take 1 tablet by mouth 2 (two) times daily.    . cholecalciferol (VITAMIN D) 1000 UNITS tablet Take 1,000 Units by mouth daily.    Marland Kitchen Cod Liver Oil 1000 MG CAPS Take 1 capsule by mouth daily.    . fluticasone (FLONASE) 50 MCG/ACT nasal spray Place 2 sprays into both nostrils daily. 16 g 2  . loratadine (CLARITIN) 10 MG tablet Take 10 mg by mouth daily.    Marland Kitchen LORazepam (ATIVAN) 0.5 MG tablet Take 1 tablet (0.5 mg total) by mouth daily as needed for anxiety. 30 tablet 0  . Multiple Vitamin (MULTIVITAMIN) tablet Take 1 tablet by mouth daily.    . Omega-3 Fatty Acids (FISH OIL) 1000 MG CAPS Take 1 capsule by mouth daily.    Marland Kitchen PARoxetine (PAXIL) 20 MG tablet TAKE 1 TABLET (20 MG TOTAL) BY MOUTH DAILY. 90 tablet 1  . vitamin E 200 UNIT capsule Take 400 Units by mouth daily.     No  facility-administered medications prior to visit.      Per HPI unless specifically indicated in ROS section below Review of Systems     Objective:    BP 120/70 (BP Location: Left Arm, Patient Position: Sitting, Cuff Size: Normal)   Pulse 78   Temp 97.6 F (36.4 C) (Oral)   Wt 161 lb (73 kg)   SpO2 99%   BMI 28.75 kg/m   Wt Readings from Last 3 Encounters:  01/06/17 161 lb (73 kg)  11/07/16 163 lb 12.8 oz (74.3 kg)  09/22/16 160 lb 8 oz (72.8 kg)    Physical Exam  Constitutional: She appears well-developed and well-nourished. No distress.  HENT:  Head: Normocephalic and atraumatic.  Right Ear: Hearing, tympanic membrane, external ear and ear canal normal.  Left Ear: Hearing, tympanic membrane, external ear and ear canal normal.  Nose: No mucosal edema or rhinorrhea. Right sinus exhibits no maxillary sinus tenderness and no frontal sinus tenderness. Left sinus exhibits no maxillary sinus tenderness and no frontal sinus tenderness.  Mouth/Throat: Uvula is midline and mucous membranes are normal. Posterior oropharyngeal erythema (mild) present. No oropharyngeal exudate, posterior oropharyngeal edema or tonsillar abscesses.  Nasal mucosal pallor Hoarse voice  Eyes: Pupils are equal, round, and reactive to light. Conjunctivae and EOM are normal. No scleral icterus.  Neck: Normal  range of motion. Neck supple.  Cardiovascular: Normal rate, regular rhythm, normal heart sounds and intact distal pulses.   No murmur heard. Pulmonary/Chest: Effort normal and breath sounds normal. No respiratory distress. She has no wheezes. She has no rales.  Lungs clear  Lymphadenopathy:    She has no cervical adenopathy.  Skin: Skin is warm and dry. No rash noted.  Nursing note and vitals reviewed.     Assessment & Plan:   Problem List Items Addressed This Visit    Viral URI - Primary    Anticipate viral URI given short duration. Supportive care reviewed as per patient instructions. Update if not  improving with treatment.  Ok for flu shot today.          Follow up plan: No Follow-up on file.  Ria Bush, MD

## 2017-02-10 ENCOUNTER — Telehealth: Payer: Self-pay

## 2017-02-10 NOTE — Telephone Encounter (Signed)
Left v/m per DPR that pt has a CPX on 02/17/17 at 11:30 with Dr Darnell Level.

## 2017-02-10 NOTE — Telephone Encounter (Signed)
PLEASE NOTE: All timestamps contained within this report are represented as [place] Standard Time. CONFIDENTIALTY NOTICE: This fax transmission is intended only for the addressee. It contains information that is legally privileged, confidential or otherwise protected from use or disclosure. If you are not the intended recipient, you are strictly prohibited from reviewing, disclosing, copying using or disseminating any of this information or taking any action in reliance on or regarding this information. If you have received this fax in error, please notify us immediately by telephone so that we can arrange for its return to [place]. Phone: [phone], Toll-Free: [phone], Fax: [phone] Page: 1 of 1 Call Id: [id number] [place] Night - Client Nonclinical Telephone Record [place] Night - Client Client Site [place] Physician Ria Bush - MD Contact Type Call Who Is Calling Patient / Member / Family / Caregiver Caller Name [place] Phone Number [phone] Patient Name Karen [name] Call Type Message Only Information Provided Reason for Call Request to [facility] Appointment Initial Comment Caller states she cancelled her appt on the 15th. She rescheduled to the 20th at 64. Patient has on her calendar that she has an appt, but doesn't know why. Patient confused about why she has an appt on the 20th and feels confident she cancelled the 15th, at 10:30. Caller declines triage Additional Comment Call Closed By: Jory Sims Transaction Date/Time: 02/09/2017 9:00:27 PM (ET)

## 2017-02-12 ENCOUNTER — Ambulatory Visit: Payer: Medicare Other

## 2017-02-13 ENCOUNTER — Ambulatory Visit: Payer: Medicare Other

## 2017-02-13 ENCOUNTER — Ambulatory Visit
Admission: RE | Admit: 2017-02-13 | Discharge: 2017-02-13 | Disposition: A | Discharge status: Home / Self Care | Payer: Medicare Other | Source: Ambulatory Visit | Attending: Family Medicine | Admitting: Family Medicine

## 2017-02-13 DIAGNOSIS — Z1231 Encounter for screening mammogram for malignant neoplasm of breast: Secondary | ICD-10-CM

## 2017-02-17 ENCOUNTER — Ambulatory Visit (INDEPENDENT_AMBULATORY_CARE_PROVIDER_SITE_OTHER): Payer: Medicare Other | Admitting: Family Medicine

## 2017-02-17 ENCOUNTER — Encounter: Payer: Self-pay | Admitting: Family Medicine

## 2017-02-17 VITALS — BP 124/80 | HR 96 | Temp 98.1°F | Ht 62.5 in | Wt 157.0 lb

## 2017-02-17 DIAGNOSIS — Z0000 Encounter for general adult medical examination without abnormal findings: Secondary | ICD-10-CM | POA: Diagnosis not present

## 2017-02-17 DIAGNOSIS — N183 Chronic kidney disease, stage 3 unspecified: Secondary | ICD-10-CM

## 2017-02-17 DIAGNOSIS — M8580 Other specified disorders of bone density and structure, unspecified site: Secondary | ICD-10-CM | POA: Diagnosis not present

## 2017-02-17 DIAGNOSIS — E785 Hyperlipidemia, unspecified: Secondary | ICD-10-CM

## 2017-02-17 DIAGNOSIS — F4323 Adjustment disorder with mixed anxiety and depressed mood: Secondary | ICD-10-CM

## 2017-02-17 DIAGNOSIS — Z7189 Other specified counseling: Secondary | ICD-10-CM

## 2017-02-17 LAB — CBC WITH DIFFERENTIAL/PLATELET
Basophils Absolute: 0.1 10*3/uL (ref 0.0–0.1)
Basophils Relative: 0.9 % (ref 0.0–3.0)
Eosinophils Absolute: 0.2 10*3/uL (ref 0.0–0.7)
Eosinophils Relative: 2.6 % (ref 0.0–5.0)
HCT: 44.4 % (ref 36.0–46.0)
Hemoglobin: 15 g/dL (ref 12.0–15.0)
Lymphocytes Relative: 32.6 % (ref 12.0–46.0)
Lymphs Abs: 2.1 10*3/uL (ref 0.7–4.0)
MCHC: 33.7 g/dL (ref 30.0–36.0)
MCV: 88.7 fl (ref 78.0–100.0)
Monocytes Absolute: 0.5 10*3/uL (ref 0.1–1.0)
Monocytes Relative: 7.5 % (ref 3.0–12.0)
Neutro Abs: 3.7 10*3/uL (ref 1.4–7.7)
Neutrophils Relative %: 56.4 % (ref 43.0–77.0)
Platelets: 156 10*3/uL (ref 150.0–400.0)
RBC: 5.01 Mil/uL (ref 3.87–5.11)
RDW: 13.6 % (ref 11.5–15.5)
WBC: 6.6 10*3/uL (ref 4.0–10.5)

## 2017-02-17 LAB — RENAL FUNCTION PANEL
Albumin: 4.2 g/dL (ref 3.5–5.2)
BUN: 14 mg/dL (ref 6–23)
CO2: 28 mEq/L (ref 19–32)
Calcium: 9.5 mg/dL (ref 8.4–10.5)
Chloride: 106 mEq/L (ref 96–112)
Creatinine, Ser: 1.16 mg/dL (ref 0.40–1.20)
GFR: 48.82 mL/min — ABNORMAL LOW (ref >60.00)
Glucose, Bld: 100 mg/dL — ABNORMAL HIGH (ref 70–99)
Phosphorus: 3.3 mg/dL (ref 2.3–4.6)
Potassium: 3.8 mEq/L (ref 3.5–5.1)
Sodium: 142 mEq/L (ref 135–145)

## 2017-02-17 LAB — MICROALBUMIN / CREATININE URINE RATIO
Creatinine,U: 360.7 mg/dL
Microalb Creat Ratio: 0.7 mg/g (ref 0.0–30.0)
Microalb, Ur: 2.4 mg/dL — ABNORMAL HIGH (ref 0.0–1.9)

## 2017-02-17 LAB — LIPID PANEL
Cholesterol: 210 mg/dL — ABNORMAL HIGH (ref 0–200)
HDL: 56.3 mg/dL (ref >39.00)
LDL Cholesterol: 123 mg/dL — ABNORMAL HIGH (ref 0–99)
NonHDL: 153.95
Total CHOL/HDL Ratio: 4
Triglycerides: 157 mg/dL — ABNORMAL HIGH (ref 0.0–149.0)
VLDL: 31.4 mg/dL (ref 0.0–40.0)

## 2017-02-17 LAB — TSH: TSH: 5.35 u[IU]/mL — ABNORMAL HIGH (ref 0.35–4.50)

## 2017-02-17 LAB — VITAMIN D 25 HYDROXY (VIT D DEFICIENCY, FRACTURES): VITD: 34.1 ng/mL (ref 30.00–100.00)

## 2017-02-17 NOTE — Patient Instructions (Addendum)
You are doing well today.  Labs today.  Sign records from latest colonoscopy report with Dr Collene Mares.  Return as needed or in 3-4 months for follow up visit.  Advanced directive packet provided today.   Health Maintenance, Female Adopting a healthy lifestyle and getting preventive care can go a long way to promote health and wellness. Talk with your health care provider about what schedule of regular examinations is right for you. This is a good chance for you to check in with your provider about disease prevention and staying healthy. In between checkups, there are plenty of things you can do on your own. Experts have done a lot of research about which lifestyle changes and preventive measures are most likely to keep you healthy. Ask your health care provider for more information. Weight and diet Eat a healthy diet  Be sure to include plenty of vegetables, fruits, low-fat dairy products, and lean protein.  Do not eat a lot of foods high in solid fats, added sugars, or salt.  Get regular exercise. This is one of the most important things you can do for your health. ? Most adults should exercise for at least 150 minutes each week. The exercise should increase your heart rate and make you sweat (moderate-intensity exercise). ? Most adults should also do strengthening exercises at least twice a week. This is in addition to the moderate-intensity exercise.  Maintain a healthy weight  Body mass index (BMI) is a measurement that can be used to identify possible weight problems. It estimates body fat based on height and weight. Your health care provider can help determine your BMI and help you achieve or maintain a healthy weight.  For females 68 years of age and older: ? A BMI below 18.5 is considered underweight. ? A BMI of 18.5 to 24.9 is normal. ? A BMI of 25 to 29.9 is considered overweight. ? A BMI of 30 and above is considered obese.  Watch levels of cholesterol and blood lipids  You  should start having your blood tested for lipids and cholesterol at 72 years of age, then have this test every 5 years.  You may need to have your cholesterol levels checked more often if: ? Your lipid or cholesterol levels are high. ? You are older than 72 years of age. ? You are at high risk for heart disease.  Cancer screening Lung Cancer  Lung cancer screening is recommended for adults 26-86 years old who are at high risk for lung cancer because of a history of smoking.  A yearly low-dose CT scan of the lungs is recommended for people who: ? Currently smoke. ? Have quit within the past 15 years. ? Have at least a 30-pack-year history of smoking. A pack year is smoking an average of one pack of cigarettes a day for 1 year.  Yearly screening should continue until it has been 15 years since you quit.  Yearly screening should stop if you develop a health problem that would prevent you from having lung cancer treatment.  Breast Cancer  Practice breast self-awareness. This means understanding how your breasts normally appear and feel.  It also means doing regular breast self-exams. Let your health care provider know about any changes, no matter how small.  If you are in your 20s or 30s, you should have a clinical breast exam (CBE) by a health care provider every 1-3 years as part of a regular health exam.  If you are 68 or older, have a  CBE every year. Also consider having a breast X-ray (mammogram) every year.  If you have a family history of breast cancer, talk to your health care provider about genetic screening.  If you are at high risk for breast cancer, talk to your health care provider about having an MRI and a mammogram every year.  Breast cancer gene (BRCA) assessment is recommended for women who have family members with BRCA-related cancers. BRCA-related cancers include: ? Breast. ? Ovarian. ? Tubal. ? Peritoneal cancers.  Results of the assessment will determine the  need for genetic counseling and BRCA1 and BRCA2 testing.  Cervical Cancer Your health care provider may recommend that you be screened regularly for cancer of the pelvic organs (ovaries, uterus, and vagina). This screening involves a pelvic examination, including checking for microscopic changes to the surface of your cervix (Pap test). You may be encouraged to have this screening done every 3 years, beginning at age 31.  For women ages 4-65, health care providers may recommend pelvic exams and Pap testing every 3 years, or they may recommend the Pap and pelvic exam, combined with testing for human papilloma virus (HPV), every 5 years. Some types of HPV increase your risk of cervical cancer. Testing for HPV may also be done on women of any age with unclear Pap test results.  Other health care providers may not recommend any screening for nonpregnant women who are considered low risk for pelvic cancer and who do not have symptoms. Ask your health care provider if a screening pelvic exam is right for you.  If you have had past treatment for cervical cancer or a condition that could lead to cancer, you need Pap tests and screening for cancer for at least 20 years after your treatment. If Pap tests have been discontinued, your risk factors (such as having a new sexual partner) need to be reassessed to determine if screening should resume. Some women have medical problems that increase the chance of getting cervical cancer. In these cases, your health care provider may recommend more frequent screening and Pap tests.  Colorectal Cancer  This type of cancer can be detected and often prevented.  Routine colorectal cancer screening usually begins at 73 years of age and continues through 72 years of age.  Your health care provider may recommend screening at an earlier age if you have risk factors for colon cancer.  Your health care provider may also recommend using home test kits to check for hidden blood  in the stool.  A small camera at the end of a tube can be used to examine your colon directly (sigmoidoscopy or colonoscopy). This is done to check for the earliest forms of colorectal cancer.  Routine screening usually begins at age 67.  Direct examination of the colon should be repeated every 5-10 years through 72 years of age. However, you may need to be screened more often if early forms of precancerous polyps or small growths are found.  Skin Cancer  Check your skin from head to toe regularly.  Tell your health care provider about any new moles or changes in moles, especially if there is a change in a mole's shape or color.  Also tell your health care provider if you have a mole that is larger than the size of a pencil eraser.  Always use sunscreen. Apply sunscreen liberally and repeatedly throughout the day.  Protect yourself by wearing long sleeves, pants, a wide-brimmed hat, and sunglasses whenever you are outside.  Heart disease,  diabetes, and high blood pressure  High blood pressure causes heart disease and increases the risk of stroke. High blood pressure is more likely to develop in: ? People who have blood pressure in the high end of the normal range (130-139/85-89 mm Hg). ? People who are overweight or obese. ? People who are African American.  If you are 35-41 years of age, have your blood pressure checked every 3-5 years. If you are 68 years of age or older, have your blood pressure checked every year. You should have your blood pressure measured twice-once when you are at a hospital or clinic, and once when you are not at a hospital or clinic. Record the average of the two measurements. To check your blood pressure when you are not at a hospital or clinic, you can use: ? An automated blood pressure machine at a pharmacy. ? A home blood pressure monitor.  If you are between 62 years and 22 years old, ask your health care provider if you should take aspirin to prevent  strokes.  Have regular diabetes screenings. This involves taking a blood sample to check your fasting blood sugar level. ? If you are at a normal weight and have a low risk for diabetes, have this test once every three years after 72 years of age. ? If you are overweight and have a high risk for diabetes, consider being tested at a younger age or more often. Preventing infection Hepatitis B  If you have a higher risk for hepatitis B, you should be screened for this virus. You are considered at high risk for hepatitis B if: ? You were born in a country where hepatitis B is common. Ask your health care provider which countries are considered high risk. ? Your parents were born in a high-risk country, and you have not been immunized against hepatitis B (hepatitis B vaccine). ? You have HIV or AIDS. ? You use needles to inject street drugs. ? You live with someone who has hepatitis B. ? You have had sex with someone who has hepatitis B. ? You get hemodialysis treatment. ? You take certain medicines for conditions, including cancer, organ transplantation, and autoimmune conditions.  Hepatitis C  Blood testing is recommended for: ? Everyone born from [birth year] through 1966. ? Anyone with known risk factors for hepatitis C.  Sexually transmitted infections (STIs)  You should be screened for sexually transmitted infections (STIs) including gonorrhea and chlamydia if: ? You are sexually active and are younger than 72 years of age. ? You are older than 72 years of age and your health care provider tells you that you are at risk for this type of infection. ? Your sexual activity has changed since you were last screened and you are at an increased risk for chlamydia or gonorrhea. Ask your health care provider if you are at risk.  If you do not have HIV, but are at risk, it Michaelis be recommended that you take a prescription medicine daily to prevent HIV infection. This is called pre-exposure prophylaxis  (PrEP). You are considered at risk if: ? You are sexually active and do not regularly use condoms or know the HIV status of your partner(s). ? You take drugs by injection. ? You are sexually active with a partner who has HIV.  Talk with your health care provider about whether you are at high risk of being infected with HIV. If you choose to begin PrEP, you should first be tested for HIV. You should  then be tested every 3 months for as long as you are taking PrEP. Pregnancy  If you are premenopausal and you may become pregnant, ask your health care provider about preconception counseling.  If you may become pregnant, take 400 to 800 micrograms (mcg) of folic acid every day.  If you want to prevent pregnancy, talk to your health care provider about birth control (contraception). Osteoporosis and menopause  Osteoporosis is a disease in which the bones lose minerals and strength with aging. This can result in serious bone fractures. Your risk for osteoporosis can be identified using a bone density scan.  If you are 40 years of age or older, or if you are at risk for osteoporosis and fractures, ask your health care provider if you should be screened.  Ask your health care provider whether you should take a calcium or vitamin D supplement to lower your risk for osteoporosis.  Menopause may have certain physical symptoms and risks.  Hormone replacement therapy may reduce some of these symptoms and risks. Talk to your health care provider about whether hormone replacement therapy is right for you. Follow these instructions at home:  Schedule regular health, dental, and eye exams.  Stay current with your immunizations.  Do not use any tobacco products including cigarettes, chewing tobacco, or electronic cigarettes.  If you are pregnant, do not drink alcohol.  If you are breastfeeding, limit how much and how often you drink alcohol.  Limit alcohol intake to no more than 1 drink per day for  nonpregnant women. One drink equals 12 ounces of beer, 5 ounces of wine, or 1 ounces of hard liquor.  Do not use street drugs.  Do not share needles.  Ask your health care provider for help if you need support or information about quitting drugs.  Tell your health care provider if you often feel depressed.  Tell your health care provider if you have ever been abused or do not feel safe at home. This information is not intended to replace advice given to you by your health care provider. Make sure you discuss any questions you have with your health care provider. Document Released: 09/30/2010 Document Revised: 08/23/2015 Document Reviewed: 12/19/2014 Elsevier Interactive Patient Education  [employer].

## 2017-02-17 NOTE — Assessment & Plan Note (Signed)
Chronic, off meds. Consider statin discussion next visit  The 10-year ASCVD risk score Mikey Bussing DC Brooke Bonito., et al., 2013) is: 9.9%   Values used to calculate the score:     Age: 71 years     Sex: Female     Is Non-Hispanic African American: No     Diabetic: No     Tobacco smoker: No     Systolic Blood Pressure: 220 mmHg     Is BP treated: No     HDL Cholesterol: 57.9 mg/dL     Total Cholesterol: 200 mg/dL

## 2017-02-17 NOTE — Assessment & Plan Note (Addendum)
Stable period on lorazepam and paxil - continue. Discussed limiting benzo

## 2017-02-17 NOTE — Assessment & Plan Note (Signed)
Preventative protocols reviewed and updated unless pt declined. Discussed healthy diet and lifestyle.  

## 2017-02-17 NOTE — Assessment & Plan Note (Signed)

## 2017-02-17 NOTE — Assessment & Plan Note (Signed)
Advanced directives: thinks she has at home. Asked to bring me copy. Doesn't want prolonged life support, ok for reversible condition. HCPOA is Sherren Mocha son then Butch Penny daughter but this has not been updated. Packet provided today.

## 2017-02-17 NOTE — Assessment & Plan Note (Signed)
Update labs.  

## 2017-02-17 NOTE — Progress Notes (Signed)
BP 124/80 (BP Location: Left Arm, Patient Position: Sitting, Cuff Size: Normal)   Pulse 96   Temp 98.1 F (36.7 C) (Oral)   Ht 5' 2.5" (1.588 m)   Wt 157 lb (71.2 kg)   SpO2 98%   BMI 28.26 kg/m    CC: CPE Subjective:    Patient ID: Karen [name], female    DOB: Feb 24, 1946, 71 y.o.   MRN: [id number]  HPI: Karen [name] is a 71 y.o. female presenting on 02/17/2017 for Medicare Wellness   Weight loss noted - she has backed off sweetened beverages.   Hearing Screening   125Hz  250Hz  500Hz  1000Hz  2000Hz  3000Hz  4000Hz  6000Hz  8000Hz   Right ear:   40 40 20  20    Left ear:   25 40 20  40      Visual Acuity Screening   Right eye Left eye Both eyes  Without correction:     With correction: 20/25 20/25 20/15   Denies depression No falls in last year  Preventative: COLONOSCOPY Date: 12/2014 1 polyp - will request records again today Collene Mares) (ROI signed) Mammogram 01/2017 WNL  Well woman - s/p hysterectomy for benign reasons. Ovaries removed.  DEXA Date: 11/2012 T score -2.4 AP spine Osteopenia  Flu shot yearly Pneumovax 2014, prevnar 2015 Tdap 07/2016 Zostavax 2012  shingrix - did not discuss Advanced directives: thinks she has at home. Asked to bring me copy. Doesn't want prolonged life support, ok for reversible condition. HCPOA is Sherren Mocha son then Butch Penny daughter but this has not been updated. Packet provided today.  Seat belt use discussed.  Sunscreen use discussed. No changing moles on skin.  Non smoker  Alcohol - none   Caffeine: lots of pepsi Lives alone - children live nearby.  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 Interim medical history since our last visit reviewed. Allergies and medications reviewed and updated. Outpatient Medications Prior to Visit  Medication Sig  Dispense Refill  . aspirin EC 81 MG tablet Take 81 mg by mouth every Monday, Wednesday, and Friday.    . Calcium Carb-Cholecalciferol (CALCIUM-VITAMIN D) 600-400 MG-UNIT TABS Take 1 tablet by mouth 2 (two) times daily.    . cholecalciferol (VITAMIN D) 1000 UNITS tablet Take 1,000 Units by mouth daily.    Marland Kitchen Cod Liver Oil 1000 MG CAPS Take 1 capsule by mouth daily.    . fluticasone (FLONASE) 50 MCG/ACT nasal spray Place 2 sprays into both nostrils daily. 16 g 2  . LORazepam (ATIVAN) 0.5 MG tablet Take 1 tablet (0.5 mg total) by mouth daily as needed for anxiety. 30 tablet 0  . Multiple Vitamin (MULTIVITAMIN) tablet Take 1 tablet by mouth daily.    . Omega-3 Fatty Acids (FISH OIL) 1000 MG CAPS Take 1 capsule by mouth daily.    Marland Kitchen PARoxetine (PAXIL) 20 MG tablet TAKE 1 TABLET (20 MG TOTAL) BY MOUTH DAILY. 90 tablet 1  . vitamin E 200 UNIT capsule Take 400 Units by mouth daily.    Marland Kitchen loratadine (CLARITIN) 10 MG tablet Take 10 mg by mouth daily.     No facility-administered medications prior to visit.      Per HPI unless specifically indicated in ROS section below Review of Systems  Constitutional: Negative for activity change, appetite change, chills, fatigue, fever and unexpected weight change.  HENT: Negative for hearing  loss.   Eyes: Negative for visual disturbance.  Respiratory: Negative for cough, chest tightness, shortness of breath and wheezing.   Cardiovascular: Negative for chest pain, palpitations and leg swelling.  Gastrointestinal: Positive for constipation. Negative for abdominal distention, abdominal pain, blood in stool, diarrhea, nausea and vomiting.  Genitourinary: Negative for difficulty urinating and hematuria.  Musculoskeletal: Negative for arthralgias, myalgias and neck pain.  Skin: Negative for rash.  Neurological: Negative for dizziness, seizures, syncope and headaches.  Hematological: Negative for adenopathy. Does not bruise/bleed easily.  Psychiatric/Behavioral:  Negative for dysphoric mood. The patient is not nervous/anxious.        Objective:    BP 124/80 (BP Location: Left Arm, Patient Position: Sitting, Cuff Size: Normal)   Pulse 96   Temp 98.1 F (36.7 C) (Oral)   Ht 5' 2.5" (1.588 m)   Wt 157 lb (71.2 kg)   SpO2 98%   BMI 28.26 kg/m   Wt Readings from Last 3 Encounters:  02/17/17 157 lb (71.2 kg)  01/06/17 161 lb (73 kg)  11/07/16 163 lb 12.8 oz (74.3 kg)    Physical Exam  Constitutional: She is oriented to person, place, and time. She appears well-developed and well-nourished. No distress.  HENT:  Head: Normocephalic and atraumatic.  Right Ear: Hearing, tympanic membrane, external ear and ear canal normal.  Left Ear: Hearing, tympanic membrane, external ear and ear canal normal.  Nose: Nose normal.  Mouth/Throat: Uvula is midline, oropharynx is clear and moist and mucous membranes are normal. No oropharyngeal exudate, posterior oropharyngeal edema or posterior oropharyngeal erythema.  Eyes: Conjunctivae and EOM are normal. Pupils are equal, round, and reactive to light. No scleral icterus.  Neck: Normal range of motion. Neck supple. No thyromegaly present.  Cardiovascular: Normal rate, regular rhythm, normal heart sounds and intact distal pulses.  No murmur heard. Pulses:      Radial pulses are 2+ on the right side, and 2+ on the left side.  Pulmonary/Chest: Effort normal and breath sounds normal. No respiratory distress. She has no wheezes. She has no rales.  Abdominal: Soft. Bowel sounds are normal. She exhibits no distension and no mass. There is no tenderness. There is no rebound and no guarding.  Musculoskeletal: Normal range of motion. She exhibits no edema.  Lymphadenopathy:    She has no cervical adenopathy.  Neurological: She is alert and oriented to person, place, and time.  CN grossly intact, station and gait intact Recall 1/3, 2/3 with cue Calculation 4/5 serial 3s  Skin: Skin is warm and dry. No rash noted.    Psychiatric: She has a normal mood and affect. Her behavior is normal. Judgment and thought content normal.  Nursing note and vitals reviewed.  Results for orders placed or performed in visit on 08/08/16  Renal function panel  Result Value Ref Range   Sodium 141 135 - 145 mEq/L   Potassium 3.8 3.5 - 5.1 mEq/L   Chloride 106 96 - 112 mEq/L   CO2 28 19 - 32 mEq/L   Calcium 9.2 8.4 - 10.5 mg/dL   Albumin 4.0 3.5 - 5.2 g/dL   BUN 19 6 - 23 mg/dL   Creatinine, Ser 1.16 0.40 - 1.20 mg/dL   Glucose, Bld 196 (H) 70 - 99 mg/dL   Phosphorus 3.7 2.3 - 4.6 mg/dL   GFR 48.89 (L) >60.00 mL/min      Assessment & Plan:   Problem List Items Addressed This Visit    Adjustment disorder with mixed anxiety and depressed  mood    Stable period on lorazepam and paxil - continue. Discussed limiting benzo       Advanced care planning/counseling discussion    Advanced directives: thinks she has at home. Asked to bring me copy. Doesn't want prolonged life support, ok for reversible condition. HCPOA is Sherren Mocha son then Butch Penny daughter but this has not been updated. Packet provided today.        CKD (chronic kidney disease) stage 3, GFR 30-59 ml/min (HCC)    Update labs       Relevant Orders   Renal function panel   TSH   CBC with Differential/Platelet   VITAMIN D 25 Hydroxy (Vit-D Deficiency, Fractures)   Microalbumin / creatinine urine ratio   Health maintenance examination    Preventative protocols reviewed and updated unless pt declined. Discussed healthy diet and lifestyle.       HLD (hyperlipidemia)    Chronic, off meds. Consider statin discussion next visit  The 10-year ASCVD risk score Mikey Bussing DC Jr., et al., 2013) is: 9.9%   Values used to calculate the score:     Age: 46 years     Sex: Female     Is Non-Hispanic African American: No     Diabetic: No     Tobacco smoker: No     Systolic Blood Pressure: 008 mmHg     Is BP treated: No     HDL Cholesterol: 57.9 mg/dL     Total  Cholesterol: 200 mg/dL       Relevant Orders   Lipid panel   Medicare annual wellness visit, subsequent - Primary    I have personally reviewed the Medicare Annual Wellness questionnaire and have noted 1. The patient's medical and social history 2. Their use of alcohol, tobacco or illicit drugs 3. Their current medications and supplements 4. The patient's functional ability including ADL's, fall risks, home safety risks and hearing or visual impairment. Cognitive function has been assessed and addressed as indicated.  5. Diet and physical activity 6. Evidence for depression or mood disorders The patients weight, height, BMI have been recorded in the chart. I have made referrals, counseling and provided education to the patient based on review of the above and I have provided the pt with a written personalized care plan for preventive services. Provider list updated.. See scanned questionairre as needed for further documentation. Reviewed preventative protocols and updated unless pt declined.       Osteopenia    Discussed calcium and vit D dosing, as well as recommended regular weight bearing exercise Will update DEXA 2019.           Follow up plan: Return for annual exam, prior fasting for blood work, medicare wellness visit.  Ria Bush, MD

## 2017-02-17 NOTE — Assessment & Plan Note (Addendum)
Discussed calcium and vit D dosing, as well as recommended regular weight bearing exercise Will update DEXA 2019.

## 2017-02-18 ENCOUNTER — Telehealth: Payer: Self-pay | Admitting: Family Medicine

## 2017-02-18 NOTE — Telephone Encounter (Signed)
Noted. We can discuss next visit or if really bothering her rec come back in and will evaluate sooner.

## 2017-02-18 NOTE — Telephone Encounter (Signed)
Left message on vm per dpr relaying message per Dr. G.  

## 2017-02-18 NOTE — Telephone Encounter (Signed)
Pt. States she does not need to come in early. Will call as needed.

## 2017-02-18 NOTE — Telephone Encounter (Signed)
Copied from [place] [id number]. Topic: General - Other >> Feb 18, 2017  8:13 AM Scherrie Gerlach wrote: Reason for CRM: pt states she forgot to tell the dr yesterday about the leg pain she has been having, which has been going on for a while.  Just forgot to tell him yesterday.

## 2017-02-24 ENCOUNTER — Telehealth: Payer: Self-pay | Admitting: *Deleted

## 2017-02-24 NOTE — Telephone Encounter (Signed)
Copied from [place] [id number]. Topic: Inquiry >> Feb 24, 2017 12:31 PM Arletha Grippe wrote: Reason for CRM: Corene Cornea from [facility] called to see if fax was received for medical clarification request form.  This is for a diagnosis for pt on DOS 12/04/15.  He stats he faxed over on 02/18/17 Cb number is [phone] ext [phone]

## 2017-02-25 ENCOUNTER — Other Ambulatory Visit: Payer: Self-pay | Admitting: Family Medicine

## 2017-02-25 MED ORDER — LORAZEPAM 0.5 MG PO TABS: 0.5000 mg | ORAL_TABLET | Freq: Every day | ORAL | 0 refills | Status: DC | PRN

## 2017-02-25 NOTE — Telephone Encounter (Signed)
Review for refill. 

## 2017-02-25 NOTE — Telephone Encounter (Signed)
Pt requesting refill lorazepam to [facility]; last refilled # 30 on 01/02/17; last seen annual 02/17/17. Last UDS 08/11/16.

## 2017-02-25 NOTE — Addendum Note (Signed)
Addended by: Ria Bush on: 02/25/2017 07:14 PM   Modules accepted: Orders

## 2017-02-25 NOTE — Telephone Encounter (Signed)
Refilled electronically 

## 2017-02-25 NOTE — Telephone Encounter (Signed)
Copied from [place]. Topic: Inquiry >> Feb 25, 2017  2:13 PM Pricilla Handler wrote: Reason for CRM: Patient called requesting a refill of LORazepam (ATIVAN) 0.5 MG tablet. [facility]

## 2017-02-27 NOTE — Telephone Encounter (Signed)
Already refilled

## 2017-03-02 NOTE — Telephone Encounter (Signed)
Filled and placed in my out box. 

## 2017-03-10 ENCOUNTER — Other Ambulatory Visit: Payer: Self-pay | Admitting: Family Medicine

## 2017-03-31 ENCOUNTER — Other Ambulatory Visit: Payer: Self-pay | Admitting: Family Medicine

## 2017-04-01 ENCOUNTER — Telehealth: Payer: Self-pay | Admitting: Family Medicine

## 2017-04-01 NOTE — Telephone Encounter (Signed)
Pt called stating that she takes lorazepam 0.5 mg and has questions about it affecting her memory and is it safe; explained to pt that there arerisks associated with taking any medication, and the education booklet will list all potential side effects of the medication; she states that taking the lorazepam helps her; encouraged pt to call back if she starts noticing changes; she verbalizes understanding.

## 2017-04-02 NOTE — Telephone Encounter (Signed)
Last filled:  02/25/17, #30 Last OV (CPE):  02/17/17 Next OV: 02/17/18

## 2017-04-03 NOTE — Telephone Encounter (Addendum)
Pt following up on refill request LORazepam (ATIVAN) 0.5 MG tablet  Pt states this med does help her.  [id number] [place] [name], [place] [phone] (Phone) [phone] (Fax)

## 2017-04-03 NOTE — Telephone Encounter (Signed)
Sent electroncially 

## 2017-04-13 ENCOUNTER — Telehealth: Payer: Self-pay

## 2017-04-13 NOTE — Telephone Encounter (Signed)
Copied from [place] [id number]. Topic: General - Other >> Apr 13, 2017  9:34 AM Marin Olp L wrote: Reason for CRM: Patient not feeling that well after cutting back on lorazapam and paroxitine per Dr. Synthia Innocent instructions. Would like a call back from his CMS to discuss.

## 2017-04-13 NOTE — Telephone Encounter (Signed)
plz call for update.  Current paxil dose is 20mg  daily - at this dose for the last 1.5 yrs.  Current lorazepam dose is 0.5mg  QD PRN.

## 2017-04-14 NOTE — Telephone Encounter (Signed)
Left message on vm to call back.  Need to confirm with pt that she is taking Paxil and lorazepam as prescribed.  Also, need to relay Dr. Synthia Innocent message.

## 2017-05-03 ENCOUNTER — Other Ambulatory Visit: Payer: Self-pay | Admitting: Family Medicine

## 2017-05-04 NOTE — Telephone Encounter (Signed)
Last filled:  04/03/17, #30 Last OV (CPE):  01/2017 Next OV:  01/2018

## 2017-05-06 NOTE — Telephone Encounter (Signed)
Sent electroincally

## 2017-06-02 ENCOUNTER — Other Ambulatory Visit: Payer: Self-pay | Admitting: Family Medicine

## 2017-06-03 NOTE — Telephone Encounter (Signed)
Last filled:  05/06/17, #30 Last OV (CPE):  02/17/17 Next OV:  02/17/18

## 2017-06-04 NOTE — Telephone Encounter (Signed)
Eprescribed.

## 2017-06-23 ENCOUNTER — Telehealth: Payer: Self-pay | Admitting: Family Medicine

## 2017-06-23 NOTE — Telephone Encounter (Signed)
Noted. Sounds like she is being treated for influenza.  Encourage she finish tamiflu and let us know if not improving with treatment. Hope she feels better quickly.

## 2017-06-23 NOTE — Telephone Encounter (Signed)
Copied from [place] [id number]. Topic: Quick Communication - See Telephone Encounter >> Jun 23, 2017 11:26 AM Hewitt Shorts wrote: CRM for notification. See Telephone encounter for: 06/23/17.pt is wanting to let Dr. Dani Gobble, that she went to an urgent care and was given oseltamivir taking it 2 times a day and told to take tylenol but has no fever only the chest  Congestion   Best number is [phone]

## 2017-06-23 NOTE — Telephone Encounter (Signed)
Spoke with pt relaying message and instructions per Dr. G.  Pt verbalizes understanding. 

## 2017-06-25 ENCOUNTER — Ambulatory Visit: Payer: Self-pay | Admitting: *Deleted

## 2017-06-25 NOTE — Telephone Encounter (Signed)
Called in c/o "having stuff in my bronchial tubes"   I'm not coughing up anything.  My voice raspy.   No fever now.   I went urgent care and was diagnosed with flu.   I don't feel bad at all now.  I completed Tamiflu.   I just wanted to make sure everything was ok. "I don't feel like I need to come in I was wanted touch base".  I instructed her to call us back if she started getting worse:   Coughing up thick yellow sputum or green sputum, fever returned, body aches, chills, etc.   She verbalized understanding and was agreeable to this plan.  I routed a note to Dr. Marc Morgans at her request making him aware she had  Called in.      Reason for Disposition . ALSO, mild vomiting occurs only when coughing  Answer Assessment - Initial Assessment Questions 1. ONSET: "When did the cough begin?"      I have stuff in my bronchial tubes.   Sometimes I cough up stuff. 2. SEVERITY: "How bad is the cough today?"      I've taken Tamiflu.   I went to the urgent care Saturday.   3. RESPIRATORY DISTRESS: "Describe your breathing."      I'm fine. 4. FEVER: "Do you have a fever?" If so, ask: "What is your temperature, how was it measured, and when did it start?"     No 5. HEMOPTYSIS: "Are you coughing up any blood?" If so ask: "How much?" (flecks, streaks, tablespoons, etc.)     No blood.   I'm not really coughing up anything.   My voice is raspy. 6. TREATMENT: "What have you done so far to treat the cough?" (e.g., meds, fluids, humidifier)     "I'm not really coughing". 7. CARDIAC HISTORY: "Do you have any history of heart disease?" (e.g., heart attack, congestive heart failure)      I'm very healthy for 72 years old.   No problems except my nerves. 8. LUNG HISTORY: "Do you have any history of lung disease?"  (e.g., pulmonary embolus, asthma, emphysema)     No 9. PE RISK FACTORS: "Do you have a history of blood clots?" (or: recent major surgery, recent prolonged travel, bedridden )     No 10. OTHER  SYMPTOMS: "Do you have any other symptoms? (e.g., runny nose, wheezing, chest pain)       I just feel like I have something in my bronchial tubes. 11. PREGNANCY: "Is there any chance you are pregnant?" "When was your last menstrual period?"       N/A 12. TRAVEL: "Have you traveled out of the country in the last month?" (e.g., travel history, exposures)       Not asked  Protocols used: COUGH - ACUTE NON-PRODUCTIVE-A-AH

## 2017-07-05 ENCOUNTER — Other Ambulatory Visit: Payer: Self-pay | Admitting: Family Medicine

## 2017-07-06 NOTE — Telephone Encounter (Signed)
Eprescribed.

## 2017-08-05 ENCOUNTER — Other Ambulatory Visit: Payer: Self-pay | Admitting: Family Medicine

## 2017-08-05 NOTE — Telephone Encounter (Signed)
Last filled 07-06-17 #30  Last OV 02-17-17 Next OV 02-17-18  Forwarding to [place] in Dr Synthia Innocent absence

## 2017-08-17 NOTE — Telephone Encounter (Signed)
Spoke with pt confirming pt is taking Paxil and lorazepam as prescribed.  Pt says yes she sure is.

## 2017-09-03 ENCOUNTER — Other Ambulatory Visit: Payer: Self-pay | Admitting: Family Medicine

## 2017-09-03 NOTE — Telephone Encounter (Signed)
Lorazepam Last filled:  07/06/17, #30 Last OV (CPE):  02/17/17 Next OV (CPE):  02/17/18

## 2017-09-03 NOTE — Telephone Encounter (Signed)
Eprescribed.

## 2017-09-25 ENCOUNTER — Ambulatory Visit: Payer: Medicare Other | Admitting: Family Medicine

## 2017-10-04 ENCOUNTER — Other Ambulatory Visit: Payer: Self-pay | Admitting: Family Medicine

## 2017-10-05 NOTE — Telephone Encounter (Signed)
Name of Medication: LORazepam (ATIVAN) 0.5 MG tablet  [facility] [place] [facility] Date and Quantity: 09/03/2017 #30  Last Office Visit and Type: 02/17/2017 Medicare Wellness Part 2  Next Office Visit and Type: 02/17/2018 Medicare Wellness Part 2  Last Controlled Substance Agreement Date: 08/11/2016  Last UDS: 08/11/2016

## 2017-10-06 NOTE — Telephone Encounter (Signed)
Eprescribed.

## 2017-10-08 ENCOUNTER — Other Ambulatory Visit: Payer: Self-pay | Admitting: Family Medicine

## 2017-10-13 ENCOUNTER — Telehealth: Payer: Self-pay | Admitting: Family Medicine

## 2017-10-13 MED ORDER — PAROXETINE HCL 30 MG PO TABS: 30.0000 mg | ORAL_TABLET | Freq: Every day | ORAL | 1 refills | Status: DC

## 2017-10-13 NOTE — Telephone Encounter (Signed)
plz notfy I've sent in higher paxil dose (30mg ) to pharmacy.  Update [place] in 1 month with effect.

## 2017-10-13 NOTE — Telephone Encounter (Signed)
Copied from [place] [phone]. Topic: Quick Communication - See Telephone Encounter >> Oct 13, 2017  8:30 AM Ahmed Prima L wrote: CRM for notification. See Telephone encounter for: 10/13/17.  Patient thinks that her depression medication has been cut back to much and wants Dr Darnell Level to know.   (PARoxetine (PAXIL) 20 MG tablet). Patient would like the nurse to call her back. She said she can tell that it is not helping as much as it was.

## 2017-10-13 NOTE — Telephone Encounter (Signed)
I spoke with pt;pt is presently taking paxil  20 mg daily and lorazepam 0.5 mg daily and pt is feeling more depressed at times. Pt wondered about increasing the paxil. NO SI/HI. Pt said it is not something she cannot handle but she wants to feel better. Pt request cb and if no answer can leave detailed message. Pt last seen 02/17/17 for annual; upcoming CPX on 02/17/18.

## 2017-10-14 NOTE — Telephone Encounter (Signed)
Patient notified and verbalized understanding, will update in one month

## 2017-10-14 NOTE — Telephone Encounter (Signed)
Noted  

## 2017-10-14 NOTE — Telephone Encounter (Signed)
Left message for pt to call back.  Need to relay Dr. G's message.  

## 2017-10-15 ENCOUNTER — Telehealth: Payer: Self-pay

## 2017-10-15 MED ORDER — PAROXETINE HCL 20 MG PO TABS: 20.0000 mg | ORAL_TABLET | Freq: Every day | ORAL | 1 refills | Status: DC

## 2017-10-15 NOTE — Telephone Encounter (Signed)
Ok will send 20mg  dose instead.

## 2017-10-15 NOTE — Telephone Encounter (Signed)
Copied from [place] [id number]. Topic: Quick Communication - See Telephone Encounter >> Oct 14, 2017  8:11 PM Brenton Grills, [place] wrote: CRM for notification. See Telephone encounter for: 10/13/17.  PEC may relay Dr. Synthia Innocent message.  Message: Please notfy pt I've sent in higher paxil dose (30mg ) to pharmacy.  Update [place] in 1 month with effect.  >> Oct 15, 2017  3:04 PM Bea Graff, NT wrote: Pt states that she does not need the 30 mg Paxil, she is doing better today.

## 2017-11-04 ENCOUNTER — Other Ambulatory Visit: Payer: Self-pay | Admitting: Family Medicine

## 2017-11-04 NOTE — Telephone Encounter (Signed)
Name of Medication: Lorazepam [facility] or Written Date and Quantity: 10/06/17, #30 Last Office Visit and Type: 02/17/17, CPE Next Office Visit and Type: 02/17/18, CPE Last Controlled Substance Agreement Date: 08/08/16 Last UDS: 08/08/16/

## 2017-11-05 NOTE — Telephone Encounter (Signed)
E perscribed 

## 2017-11-18 ENCOUNTER — Ambulatory Visit: Payer: Self-pay | Admitting: Family Medicine
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
## 2017-11-24 ENCOUNTER — Ambulatory Visit: Payer: Medicare Other | Admitting: Family Medicine

## 2017-12-04 ENCOUNTER — Other Ambulatory Visit: Payer: Self-pay | Admitting: Family Medicine

## 2017-12-04 NOTE — Telephone Encounter (Signed)
Name of Medication: Lorazepam [facility] or Written Date and Quantity: 05/04/18, #30 Last Office Visit and Type: 02/17/17, CPE Next Office Visit and Type: 02/17/18, CPE Last Controlled Substance Agreement Date: 08/08/16 Last UDS: 08/08/16

## 2017-12-04 NOTE — Telephone Encounter (Signed)
Eprescribed.

## 2018-01-04 ENCOUNTER — Other Ambulatory Visit: Payer: Self-pay | Admitting: Family Medicine

## 2018-01-04 NOTE — Telephone Encounter (Signed)
Name of Medication: Lorazepam Name of [facility] Last Fill or Written Date and Quantity: 12/04/17, #30 Last Office Visit and Type: 02/17/17, CPE Next Office Visit and Type: 02/17/18, CPE Last Controlled Substance Agreement Date: 08/08/16 Last UDS: 08/08/16

## 2018-01-06 NOTE — Telephone Encounter (Signed)
Patient calling to check the status of this refill. Please advise.

## 2018-01-06 NOTE — Telephone Encounter (Signed)
Eprescribed.

## 2018-02-08 ENCOUNTER — Other Ambulatory Visit: Payer: Self-pay | Admitting: Family Medicine

## 2018-02-08 NOTE — Telephone Encounter (Signed)
Left message for pt to call back.   Need to notify pt lorazepam refill was sent in.

## 2018-02-08 NOTE — Telephone Encounter (Signed)
Patient called to check on ref

## 2018-02-08 NOTE — Telephone Encounter (Signed)
E prescribed. plz notify patient.  

## 2018-02-08 NOTE — Telephone Encounter (Signed)
Patient called to check on the refill. Patient states she is out of the medication. Patient advised this is waiting for review by the provider.

## 2018-02-08 NOTE — Telephone Encounter (Signed)
Name of Medication: Lorazepam [facility] Last Fill or Written Date and Quantity: 01/06/18 #30 Last Office Visit and Type: 02/17/17 Next Office Visit and Type: 02/11/18 [facility] Last Controlled Substance Agreement Date: 08/08/16 Last UDS:08/08/16

## 2018-02-09 NOTE — Telephone Encounter (Signed)
Spoke with pt notifying her lorazepam refill was sent in.  Verbalizes understanding and expresses her thanks.

## 2018-02-10 ENCOUNTER — Other Ambulatory Visit: Payer: Self-pay | Admitting: Family Medicine

## 2018-02-10 DIAGNOSIS — N183 Chronic kidney disease, stage 3 unspecified: Secondary | ICD-10-CM

## 2018-02-10 DIAGNOSIS — E039 Hypothyroidism, unspecified: Secondary | ICD-10-CM | POA: Insufficient documentation

## 2018-02-10 DIAGNOSIS — E038 Other specified hypothyroidism: Secondary | ICD-10-CM | POA: Insufficient documentation

## 2018-02-10 DIAGNOSIS — E785 Hyperlipidemia, unspecified: Secondary | ICD-10-CM

## 2018-02-10 DIAGNOSIS — R7989 Other specified abnormal findings of blood chemistry: Secondary | ICD-10-CM

## 2018-02-11 ENCOUNTER — Ambulatory Visit: Payer: Medicare Other

## 2018-02-12 ENCOUNTER — Ambulatory Visit: Payer: Medicare Other

## 2018-02-17 ENCOUNTER — Encounter: Payer: Medicare Other | Admitting: Family Medicine

## 2018-02-17 DIAGNOSIS — Z0289 Encounter for other administrative examinations: Secondary | ICD-10-CM

## 2018-02-17 NOTE — Assessment & Plan Note (Deleted)
Preventative protocols reviewed and updated unless pt declined. Discussed healthy diet and lifestyle.  

## 2018-02-17 NOTE — Progress Notes (Deleted)
There were no vitals taken for this visit.   CC: *** Subjective:    Patient ID: Karen [name], female    DOB: 06/18/1945, 72 y.o.   MRN: [id number]  HPI: Karen [name] is a 72 y.o. female presenting on 02/17/2018 for No chief complaint on file.   Did not see Katha Cabal this year.   No exam data present  Fall screen passed Depression screen passed  Preventative: COLONOSCOPY Date: 12/2014 1 polyp - will request recordsagaintoday Collene Mares) (ROI signed) Mammogram11/2018 [facility] Well woman - s/p hysterectomy for benign reasons. Ovaries removed. DEXADate: 11/2012 T score -2.4 AP spine Osteopenia  Flushot yearly Pneumovax 2014,prevnar 2015 Tdap 07/2016 Zostavax 2012  shingrix - did not discuss Advanced directives: thinks she has at home. Asked to bring me copy. Doesn't want prolonged life support, ok for reversible condition. HCPOA isTodd son then Butch Penny daughterbut this has not been updated. Packet provided today.  Seat belt use discussed. Sunscreen use discussed. No changing moles on skin. Non smoker  Alcohol - none  Caffeine: lots of pepsi Lives alone - children live nearby.  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 Interim medical history since our last visit reviewed. Allergies and medications reviewed and updated. Outpatient Medications Prior to Visit  Medication Sig Dispense Refill  . aspirin EC 81 MG tablet Take 81 mg by mouth every Monday, Wednesday, and Friday.    . Calcium Carb-Cholecalciferol (CALCIUM-VITAMIN D) 600-400 MG-UNIT TABS Take 1 tablet by mouth 2 (two) times daily.    . cholecalciferol (VITAMIN D) 1000 UNITS tablet Take 1,000 Units by mouth daily.    Marland Kitchen Cod Liver Oil 1000 MG CAPS Take 1 capsule by mouth daily.    . fluticasone (FLONASE) 50 MCG/ACT nasal spray Place  2 sprays into both nostrils daily. 16 g 2  . LORazepam (ATIVAN) 0.5 MG tablet TAKE 1 TABLET BY MOUTH EVERY DAY AS NEEDED FOR ANXIETY 30 tablet 0  . Multiple Vitamin (MULTIVITAMIN) tablet Take 1 tablet by mouth daily.    . Omega-3 Fatty Acids (FISH OIL) 1000 MG CAPS Take 1 capsule by mouth daily.    Marland Kitchen PARoxetine (PAXIL) 20 MG tablet Take 1 tablet (20 mg total) by mouth daily. 90 tablet 1  . vitamin E 200 UNIT capsule Take 400 Units by mouth daily.     No facility-administered medications prior to visit.      Per HPI unless specifically indicated in ROS section below Review of Systems  Constitutional: Negative for activity change, appetite change, chills, fatigue, fever and unexpected weight change.  HENT: Negative for hearing loss.   Eyes: Negative for visual disturbance.  Respiratory: Negative for cough, chest tightness, shortness of breath and wheezing.   Cardiovascular: Negative for chest pain, palpitations and leg swelling.  Gastrointestinal: Negative for abdominal distention, abdominal pain, blood in stool, constipation, diarrhea, nausea and vomiting.  Genitourinary: Negative for difficulty urinating and hematuria.  Musculoskeletal: Negative for arthralgias, myalgias and neck pain.  Skin: Negative for rash.  Neurological: Negative for dizziness, seizures, syncope and headaches.  Hematological: Negative for adenopathy. Does not bruise/bleed easily.  Psychiatric/Behavioral: Negative for dysphoric mood. The patient is not nervous/anxious.        Objective:    There were no vitals taken for this visit.  Wt Readings from Last 3 Encounters:  02/17/17 157 lb (71.2 kg)  01/06/17 161 lb (73 kg)  11/07/16 163 lb 12.8 oz (74.3 kg)    Physical Exam  Constitutional: She is oriented to person, place, and time. She appears well-developed and well-nourished. No distress.  HENT:  Head: Normocephalic and atraumatic.  Right Ear: Hearing, tympanic membrane, external ear and ear canal normal.    Left Ear: Hearing, tympanic membrane, external ear and ear canal normal.  Nose: Nose normal.  Mouth/Throat: Uvula is midline, oropharynx is clear and moist and mucous membranes are normal. No oropharyngeal exudate, posterior oropharyngeal edema or posterior oropharyngeal erythema.  Eyes: Pupils are equal, round, and reactive to light. Conjunctivae and EOM are normal. No scleral icterus.  Neck: Normal range of motion. Neck supple.  Cardiovascular: Normal rate, regular rhythm, normal heart sounds and intact distal pulses.  No murmur heard. Pulses:      Radial pulses are 2+ on the right side, and 2+ on the left side.  Pulmonary/Chest: Effort normal and breath sounds normal. No respiratory distress. She has no wheezes. She has no rales.  Abdominal: Soft. Bowel sounds are normal. She exhibits no distension and no mass. There is no tenderness. There is no rebound and no guarding.  Musculoskeletal: Normal range of motion. She exhibits no edema.  Lymphadenopathy:    She has no cervical adenopathy.  Neurological: She is alert and oriented to person, place, and time.  CN grossly intact, station and gait intact  Skin: Skin is warm and dry. No rash noted.  Psychiatric: She has a normal mood and affect. Her behavior is normal. Judgment and thought content normal.  Nursing note and vitals reviewed.  Results for orders placed or performed in visit on 02/17/17  Renal function panel  Result Value Ref Range   Sodium 142 135 - 145 mEq/L   Potassium 3.8 3.5 - 5.1 mEq/L   Chloride 106 96 - 112 mEq/L   CO2 28 19 - 32 mEq/L   Calcium 9.5 8.4 - 10.5 mg/dL   Albumin 4.2 3.5 - 5.2 g/dL   BUN 14 6 - 23 mg/dL   Creatinine, Ser 1.16 0.40 - 1.20 mg/dL   Glucose, Bld 100 (H) 70 - 99 mg/dL   Phosphorus 3.3 2.3 - 4.6 mg/dL   GFR 48.82 (L) >60.00 mL/min  Lipid panel  Result Value Ref Range   Cholesterol 210 (H) 0 - 200 mg/dL   Triglycerides 157.0 (H) 0.0 - 149.0 mg/dL   HDL 56.30 >39.00 mg/dL   VLDL 31.4 0.0  - 40.0 mg/dL   LDL Cholesterol 123 (H) 0 - 99 mg/dL   Total CHOL/HDL Ratio 4    NonHDL 153.95   TSH  Result Value Ref Range   TSH 5.35 (H) 0.35 - 4.50 uIU/mL  CBC with Differential/Platelet  Result Value Ref Range   WBC 6.6 4.0 - 10.5 K/uL   RBC 5.01 3.87 - 5.11 Mil/uL   Hemoglobin 15.0 12.0 - 15.0 g/dL   HCT 44.4 36.0 - 46.0 %   MCV 88.7 78.0 - 100.0 fl   MCHC 33.7 30.0 - 36.0 g/dL   RDW 13.6 11.5 - 15.5 %   Platelets 156.0 150.0 - 400.0 K/uL   Neutrophils Relative % 56.4 43.0 - 77.0 %   Lymphocytes Relative 32.6 12.0 - 46.0 %   Monocytes Relative 7.5 3.0 - 12.0 %   Eosinophils Relative 2.6 0.0 - 5.0 %   Basophils Relative 0.9 0.0 - 3.0 %   Neutro Abs 3.7 1.4 - 7.7 K/uL   Lymphs Abs 2.1  0.7 - 4.0 K/uL   Monocytes Absolute 0.5 0.1 - 1.0 K/uL   Eosinophils Absolute 0.2 0.0 - 0.7 K/uL   Basophils Absolute 0.1 0.0 - 0.1 K/uL  VITAMIN D 25 Hydroxy (Vit-D Deficiency, Fractures)  Result Value Ref Range   VITD 34.10 30.00 - 100.00 ng/mL  Microalbumin / creatinine urine ratio  Result Value Ref Range   Microalb, Ur 2.4 (H) 0.0 - 1.9 mg/dL   Creatinine,U 360.7 mg/dL   Microalb Creat Ratio 0.7 0.0 - 30.0 mg/g      Assessment & Plan:   Problem List Items Addressed This Visit    Health maintenance examination    Preventative protocols reviewed and updated unless pt declined. Discussed healthy diet and lifestyle.           No orders of the defined types were placed in this encounter.  No orders of the defined types were placed in this encounter.   Follow up plan: No follow-ups on file.  Ria Bush, MD

## 2018-02-17 NOTE — Assessment & Plan Note (Deleted)

## 2018-02-18 ENCOUNTER — Telehealth: Payer: Self-pay | Admitting: Family Medicine

## 2018-02-18 NOTE — Telephone Encounter (Signed)
Left message asking pt to call office  °

## 2018-02-18 NOTE — Telephone Encounter (Signed)
Pt missed appt yesterday - can we touch base with pt and see if she'd like to reschedule? Usually doesn't miss appts.

## 2018-02-19 NOTE — Telephone Encounter (Signed)
Spoke with pt  She r/s her  Medicare wellness appointment 12/2 And her cpx with you 12/9

## 2018-03-01 ENCOUNTER — Ambulatory Visit: Payer: Medicare Other

## 2018-03-06 NOTE — Progress Notes (Deleted)
There were no vitals taken for this visit.   CC: medicare wellness/CPE Subjective:    Patient ID: Karen [name], female    DOB: 1945-06-09, 72 y.o.   MRN: [id number]  HPI: Karen [name] is a 72 y.o. female presenting on 03/08/2018 for No chief complaint on file.   Did not see Katha Cabal this year. Missed both her medicare wellness and physical appointments.   No exam data present  Depression screen -  Fall risk screen -   Preventative: COLONOSCOPY Date: 12/2014 1 polyp - will request recordsagaintoday Collene Mares) (ROI signed) Mammogram11/2018 WNL Well woman - s/p hysterectomy for benign reasons. Ovaries removed. DEXADate: 11/2012 T score -2.4 AP spine Osteopenia  Flushot yearly Pneumovax 2014,prevnar 2015 Tdap 07/2016 Zostavax 2012  shingrix - did not discuss Advanced directives: thinks she has at home. Asked to bring me copy. Doesn't want prolonged life support, ok for reversible condition. HCPOA isTodd son then Butch Penny daughterbut this has not been updated. Packet provided today.  Seat belt use discussed. Sunscreen use discussed. No changing moles on skin. Non smoker  Alcohol - none  Caffeine: lots of pepsi Lives alone - children live nearby.  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 Interim medical history since our last visit reviewed. Allergies and medications reviewed and updated. Outpatient Medications Prior to Visit  Medication Sig Dispense Refill  . aspirin EC 81 MG tablet Take 81 mg by mouth every Monday, Wednesday, and Friday.    . Calcium Carb-Cholecalciferol (CALCIUM-VITAMIN D) 600-400 MG-UNIT TABS Take 1 tablet by mouth 2 (two) times daily.    . cholecalciferol (VITAMIN D) 1000 UNITS tablet Take 1,000 Units by mouth daily.    Marland Kitchen Cod Liver Oil 1000 MG CAPS Take 1  capsule by mouth daily.    . fluticasone (FLONASE) 50 MCG/ACT nasal spray Place 2 sprays into both nostrils daily. 16 g 2  . LORazepam (ATIVAN) 0.5 MG tablet TAKE 1 TABLET BY MOUTH EVERY DAY AS NEEDED FOR ANXIETY 30 tablet 0  . Multiple Vitamin (MULTIVITAMIN) tablet Take 1 tablet by mouth daily.    . Omega-3 Fatty Acids (FISH OIL) 1000 MG CAPS Take 1 capsule by mouth daily.    Marland Kitchen PARoxetine (PAXIL) 20 MG tablet Take 1 tablet (20 mg total) by mouth daily. 90 tablet 1  . vitamin E 200 UNIT capsule Take 400 Units by mouth daily.     No facility-administered medications prior to visit.      Per HPI unless specifically indicated in ROS section below Review of Systems  Constitutional: Negative for activity change, appetite change, chills, fatigue, fever and unexpected weight change.  HENT: Negative for hearing loss.   Eyes: Negative for visual disturbance.  Respiratory: Negative for cough, chest tightness, shortness of breath and wheezing.   Cardiovascular: Negative for chest pain, palpitations and leg swelling.  Gastrointestinal: Negative for abdominal distention, abdominal pain, blood in stool, constipation, diarrhea, nausea and vomiting.  Genitourinary: Negative for difficulty urinating and hematuria.  Musculoskeletal: Negative for arthralgias, myalgias and neck pain.  Skin: Negative for rash.  Neurological: Negative for dizziness, seizures, syncope and headaches.  Hematological: Negative for adenopathy. Does not bruise/bleed easily.  Psychiatric/Behavioral: Negative for dysphoric mood. The patient is not nervous/anxious.        Objective:    There were no vitals taken for this visit.  Wt  Readings from Last 3 Encounters:  02/17/17 157 lb (71.2 kg)  01/06/17 161 lb (73 kg)  11/07/16 163 lb 12.8 oz (74.3 kg)    Physical Exam  Constitutional: She is oriented to person, place, and time. She appears well-developed and well-nourished. No distress.  HENT:  Head: Normocephalic and  atraumatic.  Right Ear: Hearing, tympanic membrane, external ear and ear canal normal.  Left Ear: Hearing, tympanic membrane, external ear and ear canal normal.  Nose: Nose normal.  Mouth/Throat: Uvula is midline, oropharynx is clear and moist and mucous membranes are normal. No oropharyngeal exudate, posterior oropharyngeal edema or posterior oropharyngeal erythema.  Eyes: Pupils are equal, round, and reactive to light. Conjunctivae and EOM are normal. No scleral icterus.  Neck: Normal range of motion. Neck supple.  Cardiovascular: Normal rate, regular rhythm, normal heart sounds and intact distal pulses.  No murmur heard. Pulses:      Radial pulses are 2+ on the right side, and 2+ on the left side.  Pulmonary/Chest: Effort normal and breath sounds normal. No respiratory distress. She has no wheezes. She has no rales.  Abdominal: Soft. Bowel sounds are normal. She exhibits no distension and no mass. There is no tenderness. There is no rebound and no guarding.  Musculoskeletal: Normal range of motion. She exhibits no edema.  Lymphadenopathy:    She has no cervical adenopathy.  Neurological: She is alert and oriented to person, place, and time.  CN grossly intact, station and gait intact  Skin: Skin is warm and dry. No rash noted.  Psychiatric: She has a normal mood and affect. Her behavior is normal. Judgment and thought content normal.  Nursing note and vitals reviewed.  Results for orders placed or performed in visit on 02/17/17  Renal function panel  Result Value Ref Range   Sodium 142 135 - 145 mEq/L   Potassium 3.8 3.5 - 5.1 mEq/L   Chloride 106 96 - 112 mEq/L   CO2 28 19 - 32 mEq/L   Calcium 9.5 8.4 - 10.5 mg/dL   Albumin 4.2 3.5 - 5.2 g/dL   BUN 14 6 - 23 mg/dL   Creatinine, Ser 1.16 0.40 - 1.20 mg/dL   Glucose, Bld 100 (H) 70 - 99 mg/dL   Phosphorus 3.3 2.3 - 4.6 mg/dL   GFR 48.82 (L) >60.00 mL/min  Lipid panel  Result Value Ref Range   Cholesterol 210 (H) 0 - 200 mg/dL     Triglycerides 157.0 (H) 0.0 - 149.0 mg/dL   HDL 56.30 >39.00 mg/dL   VLDL 31.4 0.0 - 40.0 mg/dL   LDL Cholesterol 123 (H) 0 - 99 mg/dL   Total CHOL/HDL Ratio 4    NonHDL 153.95   TSH  Result Value Ref Range   TSH 5.35 (H) 0.35 - 4.50 uIU/mL  CBC with Differential/Platelet  Result Value Ref Range   WBC 6.6 4.0 - 10.5 K/uL   RBC 5.01 3.87 - 5.11 Mil/uL   Hemoglobin 15.0 12.0 - 15.0 g/dL   HCT 44.4 36.0 - 46.0 %   MCV 88.7 78.0 - 100.0 fl   MCHC 33.7 30.0 - 36.0 g/dL   RDW 13.6 11.5 - 15.5 %   Platelets 156.0 150.0 - 400.0 K/uL   Neutrophils Relative % 56.4 43.0 - 77.0 %   Lymphocytes Relative 32.6 12.0 - 46.0 %   Monocytes Relative 7.5 3.0 - 12.0 %   Eosinophils Relative 2.6 0.0 - 5.0 %   Basophils Relative 0.9 0.0 - 3.0 %  Neutro Abs 3.7 1.4 - 7.7 K/uL   Lymphs Abs 2.1 0.7 - 4.0 K/uL   Monocytes Absolute 0.5 0.1 - 1.0 K/uL   Eosinophils Absolute 0.2 0.0 - 0.7 K/uL   Basophils Absolute 0.1 0.0 - 0.1 K/uL  VITAMIN D 25 Hydroxy (Vit-D Deficiency, Fractures)  Result Value Ref Range   VITD 34.10 30.00 - 100.00 ng/mL  Microalbumin / creatinine urine ratio  Result Value Ref Range   Microalb, Ur 2.4 (H) 0.0 - 1.9 mg/dL   Creatinine,U 360.7 mg/dL   Microalb Creat Ratio 0.7 0.0 - 30.0 mg/g      Assessment & Plan:   Problem List Items Addressed This Visit    Health maintenance examination    Preventative protocols reviewed and updated unless pt declined. Discussed healthy diet and lifestyle.           No orders of the defined types were placed in this encounter.  No orders of the defined types were placed in this encounter.   Follow up plan: No follow-ups on file.  Ria Bush, MD

## 2018-03-06 NOTE — Assessment & Plan Note (Deleted)
Preventative protocols reviewed and updated unless pt declined. Discussed healthy diet and lifestyle.  

## 2018-03-08 ENCOUNTER — Encounter: Payer: Medicare Other | Admitting: Family Medicine

## 2018-03-08 ENCOUNTER — Other Ambulatory Visit: Payer: Self-pay | Admitting: Family Medicine

## 2018-03-08 NOTE — Telephone Encounter (Signed)
Name of Medication: Lorazepam Name of Pharmacy: [facility] Last Fill or Written Date and Quantity: 02/08/18, #30/0 Last Office Visit and Type: 02/17/17, CPE Next Office Visit and Type: 03/12/18, CPE Last Controlled Substance Agreement Date: 08/08/16 Last UDS: 08/08/16

## 2018-03-09 NOTE — Telephone Encounter (Signed)
Eprescribed.
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
## 2018-03-11 NOTE — Assessment & Plan Note (Signed)
Preventative protocols reviewed and updated unless pt declined. Discussed healthy diet and lifestyle.  

## 2018-03-12 ENCOUNTER — Encounter: Payer: Self-pay | Admitting: Family Medicine

## 2018-03-12 ENCOUNTER — Ambulatory Visit (INDEPENDENT_AMBULATORY_CARE_PROVIDER_SITE_OTHER): Payer: Medicare Other | Admitting: Family Medicine

## 2018-03-12 VITALS — BP 130/78 | HR 80 | Temp 97.8°F | Ht 62.5 in | Wt 165.8 lb

## 2018-03-12 DIAGNOSIS — R7989 Other specified abnormal findings of blood chemistry: Secondary | ICD-10-CM

## 2018-03-12 DIAGNOSIS — F4323 Adjustment disorder with mixed anxiety and depressed mood: Secondary | ICD-10-CM

## 2018-03-12 DIAGNOSIS — N183 Chronic kidney disease, stage 3 unspecified: Secondary | ICD-10-CM

## 2018-03-12 DIAGNOSIS — M8580 Other specified disorders of bone density and structure, unspecified site: Secondary | ICD-10-CM

## 2018-03-12 DIAGNOSIS — Z1239 Encounter for other screening for malignant neoplasm of breast: Secondary | ICD-10-CM

## 2018-03-12 DIAGNOSIS — Z7189 Other specified counseling: Secondary | ICD-10-CM

## 2018-03-12 DIAGNOSIS — M7989 Other specified soft tissue disorders: Secondary | ICD-10-CM

## 2018-03-12 DIAGNOSIS — Z0000 Encounter for general adult medical examination without abnormal findings: Secondary | ICD-10-CM | POA: Diagnosis not present

## 2018-03-12 DIAGNOSIS — K5909 Other constipation: Secondary | ICD-10-CM | POA: Diagnosis not present

## 2018-03-12 DIAGNOSIS — E785 Hyperlipidemia, unspecified: Secondary | ICD-10-CM

## 2018-03-12 MED ORDER — PAROXETINE HCL 20 MG PO TABS: 20.0000 mg | ORAL_TABLET | Freq: Every day | ORAL | 3 refills | Status: DC

## 2018-03-12 NOTE — Assessment & Plan Note (Signed)

## 2018-03-12 NOTE — Assessment & Plan Note (Signed)
Update DEXA.

## 2018-03-12 NOTE — Assessment & Plan Note (Signed)
Check TSH, fT4 today.

## 2018-03-12 NOTE — Assessment & Plan Note (Addendum)
Advanced directives: thinks she has at home. Asked to bring me copy. Doesn't want prolonged life support, ok for reversible condition. HCPOA isTodd son then Butch Penny daughterbut this has not been updated.

## 2018-03-12 NOTE — Assessment & Plan Note (Signed)
Chronic - update FLP.

## 2018-03-12 NOTE — Assessment & Plan Note (Signed)
Discussed bowel regimen.   ?

## 2018-03-12 NOTE — Assessment & Plan Note (Signed)
Chronic, stable on paxil and lorazepam. Reviewed risks of long term benzo including possible cognitive issues. She will continue to minimize this.

## 2018-03-12 NOTE — Assessment & Plan Note (Signed)
Update labs.  

## 2018-03-12 NOTE — Patient Instructions (Addendum)
Labs today Try colace (docusate) stool softener to start. Get good fiber and water in the diet. Consider miralax powder 1 capful daily. Let me know if ongoing trouble with constipation. Goal 1 soft stool/day. [facility].  I will order repeat bone density scan. We will check left leg venous ultrasound --> did not order as she had normal venous US 2013.  If interested, check with pharmacy about new 2 shot shingles series (shingrix).  Look at your advanced directive at home to ensure it's up to date, bring [place] a copy at your convenience.   Health Maintenance, Female Adopting a healthy lifestyle and getting preventive care can go a long way to promote health and wellness. Talk with your health care provider about what schedule of regular examinations is right for you. This is a good chance for you to check in with your provider about disease prevention and staying healthy. In between checkups, there are plenty of things you can do on your own. Experts have done a lot of research about which lifestyle changes and preventive measures are most likely to keep you healthy. Ask your health care provider for more information. Weight and diet Eat a healthy diet  Be sure to include plenty of vegetables, fruits, low-fat dairy products, and lean protein.  Do not eat a lot of foods high in solid fats, added sugars, or salt.  Get regular exercise. This is one of the most important things you can do for your health. ? Most adults should exercise for at least 150 minutes each week. The exercise should increase your heart rate and make you sweat (moderate-intensity exercise). ? Most adults should also do strengthening exercises at least twice a week. This is in addition to the moderate-intensity exercise.  Maintain a healthy weight  Body mass index (BMI) is a measurement that can be used to identify possible weight problems. It estimates body fat based on height and weight. Your  health care provider can help determine your BMI and help you achieve or maintain a healthy weight.  For females 78 years of age and older: ? A BMI below 18.5 is considered underweight. ? A BMI of 18.5 to 24.9 is normal. ? A BMI of 25 to 29.9 is considered overweight. ? A BMI of 30 and above is considered obese.  Watch levels of cholesterol and blood lipids  You should start having your blood tested for lipids and cholesterol at 73 years of age, then have this test every 5 years.  You may need to have your cholesterol levels checked more often if: ? Your lipid or cholesterol levels are high. ? You are older than 73 years of age. ? You are at high risk for heart disease.  Cancer screening Lung Cancer  Lung cancer screening is recommended for adults 8-33 years old who are at high risk for lung cancer because of a history of smoking.  A yearly low-dose CT scan of the lungs is recommended for people who: ? Currently smoke. ? Have quit within the past 15 years. ? Have at least a 30-pack-year history of smoking. A pack year is smoking an average of one pack of cigarettes a day for 1 year.  Yearly screening should continue until it has been 15 years since you quit.  Yearly screening should stop if you develop a health problem that would prevent you from having lung cancer treatment.  Breast Cancer  Practice breast self-awareness. This means understanding how your breasts normally  appear and feel.  It also means doing regular breast self-exams. Let your health care provider know about any changes, no matter how small.  If you are in your 20s or 30s, you should have a clinical breast exam (CBE) by a health care provider every 1-3 years as part of a regular health exam.  If you are 63 or older, have a CBE every year. Also consider having a breast X-ray (mammogram) every year.  If you have a family history of breast cancer, talk to your health care provider about genetic  screening.  If you are at high risk for breast cancer, talk to your health care provider about having an MRI and a mammogram every year.  Breast cancer gene (BRCA) assessment is recommended for women who have family members with BRCA-related cancers. BRCA-related cancers include: ? Breast. ? Ovarian. ? Tubal. ? Peritoneal cancers.  Results of the assessment will determine the need for genetic counseling and BRCA1 and BRCA2 testing.  Cervical Cancer Your health care provider may recommend that you be screened regularly for cancer of the pelvic organs (ovaries, uterus, and vagina). This screening involves a pelvic examination, including checking for microscopic changes to the surface of your cervix (Pap test). You may be encouraged to have this screening done every 3 years, beginning at age 64.  For women ages 30-65, health care providers may recommend pelvic exams and Pap testing every 3 years, or they may recommend the Pap and pelvic exam, combined with testing for human papilloma virus (HPV), every 5 years. Some types of HPV increase your risk of cervical cancer. Testing for HPV may also be done on women of any age with unclear Pap test results.  Other health care providers may not recommend any screening for nonpregnant women who are considered low risk for pelvic cancer and who do not have symptoms. Ask your health care provider if a screening pelvic exam is right for you.  If you have had past treatment for cervical cancer or a condition that could lead to cancer, you need Pap tests and screening for cancer for at least 20 years after your treatment. If Pap tests have been discontinued, your risk factors (such as having a new sexual partner) need to be reassessed to determine if screening should resume. Some women have medical problems that increase the chance of getting cervical cancer. In these cases, your health care provider may recommend more frequent screening and Pap  tests.  Colorectal Cancer  This type of cancer can be detected and often prevented.  Routine colorectal cancer screening usually begins at 73 years of age and continues through 73 years of age.  Your health care provider may recommend screening at an earlier age if you have risk factors for colon cancer.  Your health care provider may also recommend using home test kits to check for hidden blood in the stool.  A small camera at the end of a tube can be used to examine your colon directly (sigmoidoscopy or colonoscopy). This is done to check for the earliest forms of colorectal cancer.  Routine screening usually begins at age 81.  Direct examination of the colon should be repeated every 5-10 years through 73 years of age. However, you may need to be screened more often if early forms of precancerous polyps or small growths are found.  Skin Cancer  Check your skin from head to toe regularly.  Tell your health care provider about any new moles or changes in moles, especially  if there is a change in a mole's shape or color.  Also tell your health care provider if you have a mole that is larger than the size of a pencil eraser.  Always use sunscreen. Apply sunscreen liberally and repeatedly throughout the day.  Protect yourself by wearing long sleeves, pants, a wide-brimmed hat, and sunglasses whenever you are outside.  Heart disease, diabetes, and high blood pressure  High blood pressure causes heart disease and increases the risk of stroke. High blood pressure is more likely to develop in: ? People who have blood pressure in the high end of the normal range (130-139/85-89 mm Hg). ? People who are overweight or obese. ? People who are African American.  If you are 75-74 years of age, have your blood pressure checked every 3-5 years. If you are 65 years of age or older, have your blood pressure checked every year. You should have your blood pressure measured twice-once when you are at  a hospital or clinic, and once when you are not at a hospital or clinic. Record the average of the two measurements. To check your blood pressure when you are not at a hospital or clinic, you can use: ? An automated blood pressure machine at a pharmacy. ? A home blood pressure monitor.  If you are between 55 years and 71 years old, ask your health care provider if you should take aspirin to prevent strokes.  Have regular diabetes screenings. This involves taking a blood sample to check your fasting blood sugar level. ? If you are at a normal weight and have a low risk for diabetes, have this test once every three years after 73 years of age. ? If you are overweight and have a high risk for diabetes, consider being tested at a younger age or more often. Preventing infection Hepatitis B  If you have a higher risk for hepatitis B, you should be screened for this virus. You are considered at high risk for hepatitis B if: ? You were born in a country where hepatitis B is common. Ask your health care provider which countries are considered high risk. ? Your parents were born in a high-risk country, and you have not been immunized against hepatitis B (hepatitis B vaccine). ? You have HIV or AIDS. ? You use needles to inject street drugs. ? You live with someone who has hepatitis B. ? You have had sex with someone who has hepatitis B. ? You get hemodialysis treatment. ? You take certain medicines for conditions, including cancer, organ transplantation, and autoimmune conditions.  Hepatitis C  Blood testing is recommended for: ? Everyone born from [birth year] through 1966. ? Anyone with known risk factors for hepatitis C.  Sexually transmitted infections (STIs)  You should be screened for sexually transmitted infections (STIs) including gonorrhea and chlamydia if: ? You are sexually active and are younger than 73 years of age. ? You are older than 73 years of age and your health care provider tells  you that you are at risk for this type of infection. ? Your sexual activity has changed since you were last screened and you are at an increased risk for chlamydia or gonorrhea. Ask your health care provider if you are at risk.  If you do not have HIV, but are at risk, it may be recommended that you take a prescription medicine daily to prevent HIV infection. This is called pre-exposure prophylaxis (PrEP). You are considered at risk if: ? You are sexually active  and do not regularly use condoms or know the HIV status of your partner(s). ? You take drugs by injection. ? You are sexually active with a partner who has HIV.  Talk with your health care provider about whether you are at high risk of being infected with HIV. If you choose to begin PrEP, you should first be tested for HIV. You should then be tested every 3 months for as long as you are taking PrEP. Pregnancy  If you are premenopausal and you may become pregnant, ask your health care provider about preconception counseling.  If you may become pregnant, take 400 to 800 micrograms (mcg) of folic acid every day.  If you want to prevent pregnancy, talk to your health care provider about birth control (contraception). Osteoporosis and menopause  Osteoporosis is a disease in which the bones lose minerals and strength with aging. This can result in serious bone fractures. Your risk for osteoporosis can be identified using a bone density scan.  If you are 34 years of age or older, or if you are at risk for osteoporosis and fractures, ask your health care provider if you should be screened.  Ask your health care provider whether you should take a calcium or vitamin D supplement to lower your risk for osteoporosis.  Menopause may have certain physical symptoms and risks.  Hormone replacement therapy may reduce some of these symptoms and risks. Talk to your health care provider about whether hormone replacement therapy is right for  you. Follow these instructions at home:  Schedule regular health, dental, and eye exams.  Stay current with your immunizations.  Do not use any tobacco products including cigarettes, chewing tobacco, or electronic cigarettes.  If you are pregnant, do not drink alcohol.  If you are breastfeeding, limit how much and how often you drink alcohol.  Limit alcohol intake to no more than 1 drink per day for nonpregnant women. One drink equals 12 ounces of beer, 5 ounces of wine, or 1 ounces of hard liquor.  Do not use street drugs.  Do not share needles.  Ask your health care provider for help if you need support or information about quitting drugs.  Tell your health care provider if you often feel depressed.  Tell your health care provider if you have ever been abused or do not feel safe at home. This information is not intended to replace advice given to you by your health care provider. Make sure you discuss any questions you have with your health care provider. Document Released: 09/30/2010 Document Revised: 08/23/2015 Document Reviewed: 12/19/2014 Elsevier Interactive Patient Education  [employer].
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
## 2018-03-13 LAB — COMPREHENSIVE METABOLIC PANEL
AG Ratio: 1.7 (calc) (ref 1.0–2.5)
ALT: 18 U/L (ref 6–29)
AST: 18 U/L (ref 10–35)
Albumin: 4.2 g/dL (ref 3.6–5.1)
Alkaline phosphatase (APISO): 99 U/L (ref 33–130)
BUN/Creatinine Ratio: 17 (calc) (ref 6–22)
BUN: 20 mg/dL (ref 7–25)
CO2: 27 mmol/L (ref 20–32)
Calcium: 8.9 mg/dL (ref 8.6–10.4)
Chloride: 107 mmol/L (ref 98–110)
Creat: 1.15 mg/dL — ABNORMAL HIGH (ref 0.60–0.93)
Globulin: 2.5 g/dL (calc) (ref 1.9–3.7)
Glucose, Bld: 92 mg/dL (ref 65–99)
Potassium: 3.7 mmol/L (ref 3.5–5.3)
Sodium: 143 mmol/L (ref 135–146)
Total Bilirubin: 0.4 mg/dL (ref 0.2–1.2)
Total Protein: 6.7 g/dL (ref 6.1–8.1)

## 2018-03-13 LAB — LIPID PANEL
Cholesterol: 220 mg/dL — ABNORMAL HIGH (ref <200)
HDL: 52 mg/dL (ref >50)
LDL Cholesterol (Calc): 124 mg/dL (calc) — ABNORMAL HIGH
Non-HDL Cholesterol (Calc): 168 mg/dL (calc) — ABNORMAL HIGH (ref <130)
Total CHOL/HDL Ratio: 4.2 (calc) (ref <5.0)
Triglycerides: 283 mg/dL — ABNORMAL HIGH (ref <150)

## 2018-03-13 LAB — MICROALBUMIN / CREATININE URINE RATIO
Creatinine, Urine: 127 mg/dL (ref 20–275)
Microalb Creat Ratio: 2 mcg/mg creat (ref <30)
Microalb, Ur: 0.3 mg/dL

## 2018-03-13 LAB — VITAMIN D 25 HYDROXY (VIT D DEFICIENCY, FRACTURES): Vit D, 25-Hydroxy: 29 ng/mL — ABNORMAL LOW (ref 30–100)

## 2018-03-13 LAB — TSH: TSH: 7.29 mIU/L — ABNORMAL HIGH (ref 0.40–4.50)

## 2018-03-13 LAB — T4, FREE: Free T4: 0.9 ng/dL (ref 0.8–1.8)

## 2018-03-13 LAB — EXTRA URINE SPECIMEN

## 2018-03-15 ENCOUNTER — Telehealth: Payer: Self-pay

## 2018-03-15 ENCOUNTER — Telehealth: Payer: Self-pay | Admitting: Family Medicine

## 2018-03-15 NOTE — Telephone Encounter (Addendum)
Attempted to contact pt. No answer. No vm.  Need to relay results, instructions and message per Dr. Coralie Keens labs, 03/12/18.]  Results Your vit D is borderline low. Your kidneys remain slightly impaired but very stable over the years. Your liver and sugar are normal. Cholesterol levels are worse - recommend low chol diet specifically decrease added sugars, eliminate trans fats, increase fiber and limit alcohol. Increase fatty fish (salmon, tuna, trout, etc) in the diet - these fish are rich in omega three fatty acids. All these changes together can drop triglycerides by almost 50%.  Your thyroid function remains low. You should consider starting thyroid replacement for low thyroid. This may actually help chronic constipation. Let me know if you are willing to try this - would work on slow taper to find you goal dose.

## 2018-03-15 NOTE — Telephone Encounter (Signed)
Tried calling pt no answer if pt calls back please transfer to [place] regarding bone density/mammogram

## 2018-03-16 ENCOUNTER — Encounter: Payer: Self-pay | Admitting: Family Medicine

## 2018-03-16 ENCOUNTER — Other Ambulatory Visit: Payer: Self-pay | Admitting: Family Medicine

## 2018-03-16 DIAGNOSIS — R7989 Other specified abnormal findings of blood chemistry: Secondary | ICD-10-CM

## 2018-03-16 MED ORDER — LEVOTHYROXINE SODIUM 50 MCG PO TABS: 50.0000 ug | ORAL_TABLET | Freq: Every day | ORAL | 3 refills | Status: DC

## 2018-03-16 MED ORDER — LEVOTHYROXINE SODIUM 50 MCG PO TABS: 50.0000 ug | ORAL_TABLET | Freq: Every day | ORAL | 6 refills | Status: DC

## 2018-03-16 NOTE — Telephone Encounter (Signed)
Tried calling pt no answer

## 2018-03-16 NOTE — Telephone Encounter (Signed)
Spoke with pt relaying results, instructions and message per Dr. Darnell Level.  Pt verbalizes understanding and requests results be mailed to her.  Also, pt agrees to start thyroid replacement.

## 2018-03-16 NOTE — Telephone Encounter (Signed)
[facility] faxed a note pt returned call and request cb.

## 2018-03-17 ENCOUNTER — Telehealth: Payer: Self-pay

## 2018-03-17 NOTE — Telephone Encounter (Signed)
Pt walked in to ask questions about lab results. Reviewed lab results with pt and pt asked me to write down when she should take thyroid med, when to eat and when to take other meds. Pt gets up each day at 9 AM. I wrote down to take levothyroxine 50 mcg, thyroid med at 9 AM and then wait 30 - 60' and eat breakfast and then take remaining morning meds at 11 AM. Pt voiced understanding. Nothing further needed. Pt did schedule lab appt on 05/25/18 at 9 AM for thyroid lab f/u.

## 2018-03-30 ENCOUNTER — Other Ambulatory Visit: Payer: Self-pay | Admitting: Family Medicine

## 2018-03-30 DIAGNOSIS — Z1231 Encounter for screening mammogram for malignant neoplasm of breast: Secondary | ICD-10-CM

## 2018-04-05 ENCOUNTER — Telehealth: Payer: Self-pay | Admitting: *Deleted

## 2018-04-05 ENCOUNTER — Other Ambulatory Visit: Payer: Self-pay | Admitting: Family Medicine

## 2018-04-05 MED ORDER — HYDROCORTISONE ACETATE 25 MG RE SUPP: 25.0000 mg | Freq: Two times a day (BID) | RECTAL | 0 refills | Status: DC | PRN

## 2018-04-05 NOTE — Telephone Encounter (Signed)
Spoke with pt relaying Dr. Synthia Innocent message. Verbalizes understanding. States the constipation is better but is still having problems with hemorrhoids. Please advise.

## 2018-04-05 NOTE — Telephone Encounter (Signed)
Spoke with pt asking for more info on the hemorrhoids. Says they come out but she pushes it back in.

## 2018-04-05 NOTE — Telephone Encounter (Signed)
This could be side effect of thyroid medicine although it would be strange to all of a sudden start 1 mo after starting medicine. Recommend we watch symptoms for now, if this becomes more frequent or bothersome we may need to back off thyroid medicine.

## 2018-04-05 NOTE — Telephone Encounter (Signed)
Is she having problems with hemorrhoids on inside or outside - does she feel bump when wiping? That will determine what type of medicine we could try.

## 2018-04-05 NOTE — Telephone Encounter (Signed)
Name of Medication: Lorazepam Name of Pharmacy: [facility] Last Fill or Written Date and Quantity: 03/09/18, #30/0 Last Office Visit and Type: 03/12/18, CPE Next Office Visit and Type: none Last Controlled Substance Agreement Date: 08/08/16 Last UDS: 08/08/16

## 2018-04-05 NOTE — Telephone Encounter (Signed)
Spoke to pt who states she began taking levothyroxine in Dec. Last night she was very restless and was awake at 0200. Pt states this is a new occurrence for her and after reading online, saw that it may be a side effect of the levothyroxine. pls advise

## 2018-04-05 NOTE — Telephone Encounter (Signed)
May try steroid suppositories hopefully help inflammation and symptoms, let us know if not helpful.

## 2018-04-05 NOTE — Telephone Encounter (Signed)
Eprescribed.

## 2018-04-06 NOTE — Telephone Encounter (Signed)
Attempted to contact pt. No answer. No vm.  Need to inform pt Dr. Darnell Level sent rx for suppositories to [facility].

## 2018-04-07 NOTE — Telephone Encounter (Signed)
Spoke with pt relaying Dr. G's message.  Verbalizes understanding and expresses her thanks.  

## 2018-04-12 ENCOUNTER — Other Ambulatory Visit: Payer: Medicare Other

## 2018-04-30 ENCOUNTER — Ambulatory Visit: Payer: Medicare Other

## 2018-05-08 ENCOUNTER — Other Ambulatory Visit: Payer: Self-pay | Admitting: Family Medicine

## 2018-05-09 NOTE — Telephone Encounter (Signed)
Eprescribed.

## 2018-05-10 ENCOUNTER — Other Ambulatory Visit: Payer: Self-pay | Admitting: *Deleted

## 2018-05-10 NOTE — Telephone Encounter (Signed)
Patient called stating that Dr. Danise Mina gave her hydrocortisone suppositories for her hemorrhoids. Patient stated that they are some better, but not completely gone. Patient stated that she had been using them just once a day and did not realize that she maybe should have used them twice a day to clear them up. Patient stated that she is out and would like a refill sent her pharmacy. [facility]

## 2018-05-11 MED ORDER — HYDROCORTISONE ACETATE 25 MG RE SUPP: 25.0000 mg | Freq: Two times a day (BID) | RECTAL | 0 refills | Status: DC | PRN

## 2018-05-11 NOTE — Telephone Encounter (Signed)
Sent. Thanks.   

## 2018-05-25 ENCOUNTER — Other Ambulatory Visit: Payer: Medicare Other

## 2018-05-26 ENCOUNTER — Other Ambulatory Visit (INDEPENDENT_AMBULATORY_CARE_PROVIDER_SITE_OTHER): Payer: Medicare Other

## 2018-05-26 DIAGNOSIS — R7989 Other specified abnormal findings of blood chemistry: Secondary | ICD-10-CM | POA: Diagnosis not present

## 2018-05-27 ENCOUNTER — Ambulatory Visit
Admission: RE | Admit: 2018-05-27 | Discharge: 2018-05-27 | Disposition: A | Discharge status: Home / Self Care | Payer: Medicare Other | Source: Ambulatory Visit | Attending: Family Medicine | Admitting: Family Medicine

## 2018-05-27 DIAGNOSIS — Z1231 Encounter for screening mammogram for malignant neoplasm of breast: Secondary | ICD-10-CM

## 2018-05-27 LAB — T4, FREE: Free T4: 0.7 ng/dL (ref 0.60–1.60)

## 2018-05-27 LAB — TSH: TSH: 2.05 u[IU]/mL (ref 0.35–4.50)

## 2018-05-28 ENCOUNTER — Encounter: Payer: Self-pay | Admitting: Family Medicine

## 2018-05-28 LAB — HM MAMMOGRAPHY

## 2018-06-08 ENCOUNTER — Other Ambulatory Visit: Payer: Self-pay | Admitting: Family Medicine

## 2018-06-09 ENCOUNTER — Other Ambulatory Visit: Payer: Self-pay | Admitting: Family Medicine

## 2018-06-09 NOTE — Telephone Encounter (Signed)
Eprescribed.

## 2018-06-09 NOTE — Telephone Encounter (Signed)
Name of Medication: Lorazepam Name of Pharmacy: [facility] Last Fill or Written Date and Quantity: 05/09/18, #30 Last Office Visit and Type: 03/12/18, AWV Next Office Visit and Type: none Last Controlled Substance Agreement Date: 08/08/16 Last UDS: 08/08/16

## 2018-06-10 NOTE — Telephone Encounter (Signed)
Electronic refill request. [facility] Last office visit:   03/12/18 CPE Last Filled:    12 suppository 0 05/11/2018  Please advise.

## 2018-07-09 ENCOUNTER — Other Ambulatory Visit: Payer: Self-pay | Admitting: Family Medicine

## 2018-07-11 ENCOUNTER — Other Ambulatory Visit: Payer: Self-pay | Admitting: Family Medicine

## 2018-07-12 NOTE — Telephone Encounter (Signed)
Eprescribed.

## 2018-07-12 NOTE — Telephone Encounter (Signed)
I spoke with pt and notified pt lorazepam was sent to [facility] this morning. Pt voiced understanding and will pick up.

## 2018-07-12 NOTE — Telephone Encounter (Signed)
[facility] Patient Name: [facility] Gender: Female DOB: 16-Mar-1946 Age: 73 Y 25 M 1 D Return Phone Number: [phone] (Primary), [phone] (Secondary) Address[place] [address] Client [place] Night - Client Client Site [place] Physician Ria Bush - MD Contact Type Call Who Is Calling Patient / Member / Family / Caregiver Call Type Triage / Clinical Relationship To Patient Self Return Phone Number [phone] (Primary) Chief Complaint Depression Reason for Call Request to Speak to a Physician Initial Comment Caller states lorazepam has not been called in, patient is completely out. Translation No Nurse Assessment Guidelines Guideline Title Affirmed Question Affirmed Notes Nurse Date/Time (Eastern Time) Disp. Time [name] Time) Disposition Final User 07/10/2018 4:10:32 PM Attempt made - no message left Nicki Reaper, RN, Malachy Mood 07/10/2018 4:25:13 PM FINAL ATTEMPT MADE - message left Yes Nicki Reaper, RN, Malachy Mood Comments User: Burna Sis, RN Date/Time [name] Time): 07/10/2018 4:11:01 PM Phone rang several times and then went busy, unable to leave message User: Burna Sis, RN Date/Time [name] Time): 07/10/2018 4:25:47 PM Left final attempt message on secondary phone number

## 2018-08-02 ENCOUNTER — Telehealth: Payer: Self-pay

## 2018-08-02 NOTE — Telephone Encounter (Signed)
Attempted to contact pt.  No answer.  No vm.  Need to relay Dr. G's message.  

## 2018-08-02 NOTE — Telephone Encounter (Signed)
Patient would like information on CBD oil and if this could be beneficial for her.  She is currently not having any symptoms and is taking medication for depression and anxiety.   Will defer to Dr. Danise Mina.   Last OV 03/12/18 for AWV.

## 2018-08-02 NOTE — Telephone Encounter (Signed)
I don't really recommend CBD oil as it can be expensive and it doesn't really have strong evidence for much benefit.

## 2018-08-02 NOTE — Telephone Encounter (Signed)
[facility] Patient Name: Karen [name] Gender: Female DOB: 18-May-1945 Age: 73 Y 70 M 22 D Return Phone Number: [phone] (Primary) Address: City/State/Zip: [place] [address] Client [facility] Night - Client Client Site [place] Physician Ria Bush - MD Contact Type Call Who Is Calling Patient / Member / Family / Caregiver Call Type Triage / Clinical Relationship To Patient Self Return Phone Number [phone] (Primary) Chief Complaint Medication Question (non symptomatic) Reason for Call Symptomatic / Request for [place] states she would like information regarding CBD treatment, Translation No Nurse Assessment Nurse: Jimmye Norman, RN, [place] Date/Time (Eastern Time): 07/31/2018 6:34:35 PM Confirm and document reason for call. If symptomatic, describe symptoms. ---Caller states she would like information regarding CBD treatment, states someone told her about it and she is very careful with what she does. Told that it is beneficial for her, states she takes medication for depression and anxiety. Is wanting to know if it could be good for her in some way. Is not currently having any symptoms. States she was having leg pain but any longer. Has the patient had close contact with a person known or suspected to have the novel coronavirus illness OR traveled / lives in area with major community spread (including international travel) in the last 14 days from the onset of symptoms? * If Asymptomatic, screen for exposure and travel within the last 14 days. ---Not Applicable Does the patient have any new or worsening symptoms? ---No Please document clinical information provided and list any resource used. ---RN instructed caller that she would need to get Dr. Alden Hipp approval if she wanted to  start using CBD oil. RN instructed caller that she would fax report to office, instructed caller to call the office during office hours on Monday. Caller verbalized understanding Guidelines Guideline Title Affirmed Question Affirmed Notes Nurse Date/Time (Eastern Time) Disp. Time [name] Time) Disposition Final User 07/31/2018 6:52:48 PM Clinical Call Yes Jimmye Norman, RN, Loree Fee

## 2018-08-03 NOTE — Telephone Encounter (Signed)
Attempted to contact pt.  No answer.  No vm.  Need to relay Dr. G's message.  

## 2018-08-04 NOTE — Telephone Encounter (Signed)
Attempted to contact pt.  No answer.  No vm.  Need to relay Dr. Synthia Innocent message. Mailing a letter.

## 2018-08-10 ENCOUNTER — Other Ambulatory Visit: Payer: Self-pay | Admitting: Family Medicine

## 2018-08-10 NOTE — Telephone Encounter (Signed)
Electronic refill request Lorazepam Last refill 07/12/18 #30 Last office visit 03/12/18

## 2018-08-11 ENCOUNTER — Other Ambulatory Visit: Payer: Self-pay

## 2018-08-11 ENCOUNTER — Encounter: Payer: Self-pay | Admitting: Family Medicine

## 2018-08-11 ENCOUNTER — Ambulatory Visit: Payer: Medicare Other | Admitting: Family Medicine

## 2018-08-11 VITALS — BP 112/70 | HR 82 | Temp 98.8°F | Ht 62.5 in | Wt 163.2 lb

## 2018-08-11 DIAGNOSIS — M6281 Muscle weakness (generalized): Secondary | ICD-10-CM

## 2018-08-11 DIAGNOSIS — M25462 Effusion, left knee: Secondary | ICD-10-CM | POA: Diagnosis not present
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
## 2018-08-11 NOTE — Telephone Encounter (Signed)
Patient was in office on 08/11/2018 seeing Dr. Lorelei Pont and requested refill on her [facility] suppositories as well.

## 2018-08-12 MED ORDER — HYDROCORTISONE ACETATE 25 MG RE SUPP: RECTAL | 0 refills | Status: DC

## 2018-08-12 NOTE — Telephone Encounter (Signed)
Eprescribed.

## 2018-08-18 ENCOUNTER — Telehealth: Payer: Self-pay

## 2018-08-18 ENCOUNTER — Encounter: Payer: Self-pay | Admitting: Family Medicine

## 2018-08-18 ENCOUNTER — Ambulatory Visit (INDEPENDENT_AMBULATORY_CARE_PROVIDER_SITE_OTHER): Payer: Medicare Other | Admitting: Family Medicine

## 2018-08-18 ENCOUNTER — Other Ambulatory Visit: Payer: Self-pay

## 2018-08-18 VITALS — BP 128/70 | HR 105 | Temp 98.7°F | Ht 62.0 in | Wt 166.0 lb

## 2018-08-18 DIAGNOSIS — K5909 Other constipation: Secondary | ICD-10-CM

## 2018-08-18 DIAGNOSIS — N183 Chronic kidney disease, stage 3 unspecified: Secondary | ICD-10-CM

## 2018-08-18 DIAGNOSIS — E039 Hypothyroidism, unspecified: Secondary | ICD-10-CM | POA: Diagnosis not present

## 2018-08-18 DIAGNOSIS — M7989 Other specified soft tissue disorders: Secondary | ICD-10-CM

## 2018-08-18 MED ORDER — LEVOTHYROXINE SODIUM 50 MCG PO TABS: 50.0000 ug | ORAL_TABLET | Freq: Every day | ORAL | 1 refills | Status: DC

## 2018-08-18 NOTE — Patient Instructions (Addendum)
Leg is looking ok - some arthritis of the knee likely leading to fluid in the knee - no need to do anything for the swelling as long as no redness, warmth or pain developing.  Kidney check today (labs).
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
## 2018-08-18 NOTE — Telephone Encounter (Signed)
Will see then. 

## 2018-08-18 NOTE — Assessment & Plan Note (Signed)
Has noted improvement since starting levothyroxine.

## 2018-08-18 NOTE — Assessment & Plan Note (Signed)
Chronic, stable on levothyroxine 57mcg daily.  She feels better on this regimen, notes significant improvement in chronic constipation trouble since starting levothyroxine.

## 2018-08-18 NOTE — Assessment & Plan Note (Addendum)
Update labs today. Anticipate medicorenal disease.

## 2018-08-18 NOTE — Telephone Encounter (Signed)
[facility] Patient Name: Karen [name] Kitzmiller Gender: Female DOB: [birth year] Age: 73 Y 45 M 9 D Return Phone Number: [phone] (Primary), [phone] (Secondary) Address[place] [address] Client [facility] [place] Night - Client Client Site [place] Physician Ria Bush - MD Contact Type Call Who Is Calling Patient / Member / Family / Caregiver Call Type Triage / Clinical Relationship To Patient Self Return Phone Number [phone] (Primary) Chief Complaint Rectal Bleeding Reason for Call Request to Schedule Office Appointment Initial Comment Caller states she was seen the other day by a different MD for left leg swelling. The swelling has not gone down. Was told it had fluid. Was also given a suppository for hemorrhoids. She is still having bleeding. She wants to see Dr. Danise Mina to be made aware of the leg swelling. She has been a widow since October 2013. Translation No Nurse Assessment Nurse: Antionette Fairy, RN, [place] Date/Time (Eastern Time): 08/17/2018 6:16:07 PM Confirm and document reason for call. If symptomatic, describe symptoms. ---Caller states she was seen by a different doc than her PCP and wants to see Dr. Danise Mina. She's still having leg swelling. No new or worsening symptoms today, just want to see her doctor specifically. She states she will call back in the morning to make an appointment. Has the patient had close contact with a person known or suspected to have the novel coronavirus illness OR traveled / lives in area with major community spread (including international travel) in the last 14 days from the onset of symptoms? * If Asymptomatic, screen for exposure and travel within the last 14 days. ---No Does the patient have any new or worsening symptoms? ---No Guidelines Guideline Title  Affirmed Question Affirmed Notes Nurse Date/Time (Eastern Time) Disp. Time [name] Time) Disposition Final User 08/17/2018 6:13:43 PM Send to Urgent Adine Madura, RN, Santiago Glad 08/17/2018 6:18:51 PM Clinical Call Yes Antionette Fairy, RN, Romelle Starcher
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
## 2018-08-18 NOTE — Telephone Encounter (Signed)
I spoke with pt and she is not concerned today about hemorrhoids but is concerned about continued swelling in lt leg; no redness or warmth but sometimes has pain in lower legs like a leg cramp. No covid symptoms; see appt notes.pt scheduled for appt today at 1:30 per Dr G.pt voiced understanding and appreciative.

## 2018-08-19 LAB — RENAL FUNCTION PANEL
Albumin: 3.9 g/dL (ref 3.5–5.2)
BUN: 14 mg/dL (ref 6–23)
CO2: 29 mEq/L (ref 19–32)
Calcium: 8.9 mg/dL (ref 8.4–10.5)
Chloride: 105 mEq/L (ref 96–112)
Creatinine, Ser: 1.18 mg/dL (ref 0.40–1.20)
GFR: 44.84 mL/min — ABNORMAL LOW (ref >60.00)
Glucose, Bld: 156 mg/dL — ABNORMAL HIGH (ref 70–99)
Phosphorus: 3.2 mg/dL (ref 2.3–4.6)
Potassium: 3.7 mEq/L (ref 3.5–5.1)
Sodium: 141 mEq/L (ref 135–145)

## 2018-09-03 ENCOUNTER — Telehealth: Payer: Self-pay

## 2018-09-03 NOTE — Telephone Encounter (Signed)
Spoke with pt relaying Dr. G's instructions.  Pt verbalizes understanding.  

## 2018-09-03 NOTE — Telephone Encounter (Signed)
Goal is 1 soft BM per day. Need to avoid constipation.  Avoid spending extra time sitting on the commode (ie reading, etc).  If active hemorrhoid, add daily warm water sitz baths (sitting in tub with warm water 2 times a day for 10-15 min at a time).  If ongoing trouble, come in for in-office visit when acutely inflamed or painful.

## 2018-09-03 NOTE — Telephone Encounter (Signed)
[place] Night - Client Nonclinical Telephone Record AccessNurse Client [place] [facility] Night - Client Client Site [place] Physician Ria Bush - MD Contact Type Call Who Is Calling Patient / Member / Family / Caregiver Caller Name special ranes Phone Number [phone] Patient Name Karen [name] Patient DOB 1945-04-28 Call Type Message Only Information Provided Reason for Call Request for General Office Information Initial Comment Caller states they have hemorrhoids and they are not going away with medication doctor prescribed. Additional Comment Declined triage. Caller wanted to let the doctor know. Caller may call back tomorrow.

## 2018-09-10 ENCOUNTER — Other Ambulatory Visit: Payer: Self-pay | Admitting: Family Medicine

## 2018-09-10 NOTE — Telephone Encounter (Signed)
Name of Medication:Lorazepam 0.5 mg  Name of Pharmacy:[facility] or Written Date and Quantity:# 30 on 08/12/18  Last Office Visit and Type:08/18/18 acute & 12/13/19CPX  Next Office Visit and Type:none scheduled  Last Controlled Substance Agreement Date:08/08/16  Last UDS:08/11/16

## 2018-09-10 NOTE — Telephone Encounter (Signed)
E prescribd

## 2018-09-16 ENCOUNTER — Telehealth: Payer: Self-pay | Admitting: Family Medicine

## 2018-09-16 NOTE — Telephone Encounter (Signed)
Reported to me by the after hours maintenance staff that she had been in the parking lot and was asking about an appointment.  This was after the clinic was closed and locked.  I went to parking lot but she was already gone.   Called pt.  She had fallen today and bumped R leg and R elbow.  No LOC, not bleeding.  No emergent sx.  D/w pt about options.  She can go to UC or call back to triage line tonight if needed. Can use ice in the meantime.  She agreed and thanked me for the call.    I asked her to call clinic tomorrow AM to get on the schedule, if she didn't get a call from [place] first.    Please call pt Friday AM about getting OV.   Routed to triage and PCP.  I thank all involved.

## 2018-09-17 ENCOUNTER — Ambulatory Visit: Payer: Medicare Other | Admitting: Family Medicine

## 2018-09-17 ENCOUNTER — Encounter: Payer: Self-pay | Admitting: Family Medicine

## 2018-09-17 ENCOUNTER — Other Ambulatory Visit: Payer: Self-pay

## 2018-09-17 VITALS — BP 120/68 | HR 78 | Temp 98.2°F | Ht 62.5 in | Wt 162.2 lb

## 2018-09-17 DIAGNOSIS — M7021 Olecranon bursitis, right elbow: Secondary | ICD-10-CM | POA: Insufficient documentation

## 2018-09-17 DIAGNOSIS — S59901A Unspecified injury of right elbow, initial encounter: Secondary | ICD-10-CM | POA: Diagnosis not present

## 2018-09-17 DIAGNOSIS — M7071 Other bursitis of hip, right hip: Secondary | ICD-10-CM | POA: Insufficient documentation

## 2018-09-17 DIAGNOSIS — M7061 Trochanteric bursitis, right hip: Secondary | ICD-10-CM

## 2018-09-17 NOTE — Telephone Encounter (Signed)
Seeing today 

## 2018-09-17 NOTE — Assessment & Plan Note (Signed)
FROM at elbow without pain not consistent with fracture. Anticipate suffered elbow contusion with soft tissue hematoma overlying bone. Supportive home care reviewed, elbow wrapped in compression wrap. rec tylenol, ice PRN.
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
## 2018-09-17 NOTE — Patient Instructions (Signed)
You have hip bursitis and elbow bruise and hematoma. This should improve over time. Compression wrap to R elbow for support. Take tylenol 500mg  with meals for pain.  Continue ice or heating pad for symptomatic relief.   Bursitis  Bursitis is inflammation and irritation of a bursa, which is one of the small, fluid-filled sacs that cushion and protect the moving parts of your body. These sacs are located between bones and muscles, bones and muscle attachments, or bones and skin areas that are next to bones. A bursa protects those structures from the wear and tear that results from frequent movement. An inflamed bursa causes pain and swelling. Fluid may build up inside the sac. Bursitis is most common near joints, especially the knees, elbows, hips, and shoulders. What are the causes? This condition may be caused by:  Injury from: ? A direct hit (blow), like falling on your knee or elbow. ? Overuse of a joint (repetitive stress).  Infection. This can happen if bacteria get into a bursa through a cut or scrape near a joint.  Diseases that cause joint inflammation, such as gout and rheumatoid arthritis. What increases the risk? You are more likely to develop this condition if you:  Have a job or hobby that involves a lot of repetitive stress on your joints.  Have a condition that weakens your body's defense system (immune system), such as diabetes, cancer, or HIV.  Do any of these often: ? Lift and reach overhead. ? Kneel or lean on hard surfaces. ? Run or walk. What are the signs or symptoms? The most common symptoms of this condition include:  Pain that gets worse when you move the affected body part or use it to support (bear) your body weight.  Inflammation.  Stiffness. Other symptoms include:  Redness.  Swelling.  Tenderness.  Warmth.  Pain that continues after rest.  Fever or chills. These may occur in bursitis that is caused by infection. How is this diagnosed?  This condition may be diagnosed based on:  Your medical history and a physical exam.  Imaging tests, such as an MRI.  A procedure to drain fluid from the bursa with a needle (aspiration). The fluid may be checked for signs of infection or gout.  Blood tests to rule out other causes of inflammation. How is this treated? This condition can usually be treated at home with rest, ice, applying pressure (compression), and raising the body part that is affected (elevation). This is called RICE therapy. For mild bursitis, RICE therapy may be all you need. Other treatments may include:  NSAIDs to treat pain and inflammation.  Corticosteroid medicines to fight inflammation. These medicines may be injected into and around the area of bursitis.  Aspiration of fluid from the bursa to relieve pain and improve movement.  Antibiotic medicine to treat an infected bursa.  A splint, brace, or walking aid, such as a cane.  Physical therapy if you continue to have pain or limited movement.  Surgery to remove a damaged or infected bursa. This may be needed if other treatments have not worked. Follow these instructions at home: Medicines  Take over-the-counter and prescription medicines only as told by your health care provider.  If you were prescribed an antibiotic medicine, take it as told by your health care provider. Do not stop taking the antibiotic even if you start to feel better. General instructions   Rest the affected area as told by your health care provider. ? If possible, raise (elevate) the  affected area above the level of your heart while you are sitting or lying down. ? Avoid activities that make pain worse.  Use splints, braces, pads, or walking aids as told by your health care provider.  If directed, put ice on the affected area: ? If you have a removable splint or brace, remove it as told by your health care provider. ? Put ice in a plastic bag. ? Place a towel between your  skin and the bag or between your splint or brace and the bag. ? Leave the ice on for 20 minutes, 2-3 times a day.  Keep all follow-up visits as told by your health care provider. This is important. Preventing future episodes Take actions to help prevent future episodes of bursitis.  Wear knee pads if you kneel often.  Wear sturdy running or walking shoes that fit you well.  Take breaks regularly from repetitive activity.  Warm up by stretching before doing any activity that takes a lot of effort.  Maintain a healthy weight or lose weight as recommended by your health care provider. If you need help doing this, ask your health care provider.  Exercise regularly. Start any new physical activity gradually. Contact a health care provider if you:  Have a fever.  Have chills.  Have bursitis that is not getting better with treatment or home care. Summary  Bursitis is inflammation and irritation of a bursa, which is one of the small, fluid-filled sacs that cushion and protect the moving parts of your body.  An inflamed bursa causes pain and swelling.  Bursitis is commonly diagnosed with a physical exam, but other tests are sometimes needed.  This condition can usually be treated at home with rest, ice, applying pressure (compression), and raising the body part that is affected (elevation). This is called RICE therapy. This information is not intended to replace advice given to you by your health care provider. Make sure you discuss any questions you have with your health care provider. Document Released: 03/14/2000 Document Revised: 01/30/2017 Document Reviewed: 01/30/2017 Elsevier Interactive Patient Education  2019 [employer].

## 2018-09-17 NOTE — Telephone Encounter (Addendum)
I spoke with pt; pt fell on 09/16/18 and had pain in rt elbow last night and rt elbow swollen this morning. Pt is going to use ice on elbow 20'on and 20' off until seen today at 12:45; pt said leg is not bothering her today.no covid symptoms, no travel and no known exposure to covid. FYI to Dr Darnell Level.

## 2018-09-17 NOTE — Progress Notes (Signed)
This visit was conducted in person.  BP 120/68 (BP Location: Left Arm, Patient Position: Sitting, Cuff Size: Normal)   Pulse 78   Temp 98.2 F (36.8 C) (Temporal)   Ht 5' 2.5" (1.588 m)   Wt 162 lb 4 oz (73.6 kg)   SpO2 98%   BMI 29.20 kg/m    CC: fall with elbow injury Subjective:    Patient ID: Karen [name], female    DOB: 09/02/1945, 73 y.o.   MRN: [id number]  HPI: Karen [name] is a 73 y.o. female presenting on 09/17/2018 for Fall (Pt on 09/12/18 while out eating. Injured right elbow and right hip. Has applied ice to elbow. )   DOI: 09/12/2018  Fall while at restaurant in [place], fell off elevated cement platform onto R elbow and R hip.  Treating with ice.      Relevant past medical, surgical, family and social history reviewed and updated as indicated. Interim medical history since our last visit reviewed. Allergies and medications reviewed and updated. Outpatient Medications Prior to Visit  Medication Sig Dispense Refill  . aspirin EC 81 MG tablet Take 81 mg by mouth every Monday, Wednesday, and Friday.    . Calcium Carb-Cholecalciferol (CALCIUM-VITAMIN D) 600-400 MG-UNIT TABS Take 1 tablet by mouth 2 (two) times daily.    . cholecalciferol (VITAMIN D) 1000 UNITS tablet Take 1,000 Units by mouth daily.    Marland Kitchen Cod Liver Oil 1000 MG CAPS Take 1 capsule by mouth daily.    . fluticasone (FLONASE) 50 MCG/ACT nasal spray Place 2 sprays into both nostrils daily. 16 g 2  . hydrocortisone (ANUSOL-HC) 25 MG suppository UNWRAP AND PLACE 1 SUPPOSITORY (25 MG TOTAL) RECTALLY 2 (TWO) TIMES DAILY AS NEEDED FOR HEMORRHOIDS 12 suppository 0  . levothyroxine (SYNTHROID) 50 MCG tablet Take 1 tablet (50 mcg total) by mouth daily. 90 tablet 1  . LORazepam (ATIVAN) 0.5 MG tablet TAKE 1 TABLET BY MOUTH EVERY DAY AS NEEDED FOR ANXIETY 30 tablet 0  . Multiple Vitamin (MULTIVITAMIN) tablet Take 1 tablet by mouth daily.    . Omega-3 Fatty Acids (FISH OIL) 1000 MG CAPS Take 1 capsule by mouth daily.     Marland Kitchen PARoxetine (PAXIL) 20 MG tablet Take 1 tablet (20 mg total) by mouth daily. 90 tablet 3  . vitamin E 200 UNIT capsule Take 400 Units by mouth daily.     No facility-administered medications prior to visit.      Per HPI unless specifically indicated in ROS section below Review of Systems Objective:    BP 120/68 (BP Location: Left Arm, Patient Position: Sitting, Cuff Size: Normal)   Pulse 78   Temp 98.2 F (36.8 C) (Temporal)   Ht 5' 2.5" (1.588 m)   Wt 162 lb 4 oz (73.6 kg)   SpO2 98%   BMI 29.20 kg/m   Wt Readings from Last 3 Encounters:  09/17/18 162 lb 4 oz (73.6 kg)  08/18/18 166 lb (75.3 kg)  08/11/18 163 lb 4 oz (74 kg)    Physical Exam Vitals signs and nursing note reviewed.  Constitutional:      General: She is not in acute distress.    Appearance: Normal appearance. She is not ill-appearing.  Musculoskeletal: Normal range of motion.        General: Swelling, tenderness and signs of injury present.     Comments:  2+ rad pulses  L arm WNL R shoulder - FROM without pain R wrist - FROM without pain R elbow -  FROM without pain. Mild swelling from olecranon bursa extending proximally down posterior elbow, no erythema or warmth. Tender to palpation along distal posterior ulna.  L leg WNL R leg - FROM at knee and hip without pain, tender to palpation at R trochanteric bursa  Skin:    General: Skin is warm and dry.     Capillary Refill: Capillary refill takes less than 2 seconds.     Findings: Bruising (small bruise to lateral R hip) present. No erythema or rash.  Neurological:     General: No focal deficit present.     Mental Status: She is alert.  Psychiatric:        Mood and Affect: Mood normal.        Behavior: Behavior normal.       Results for orders placed or performed in visit on 08/18/18  Renal function panel  Result Value Ref Range   Sodium 141 135 - 145 mEq/L   Potassium 3.7 3.5 - 5.1 mEq/L   Chloride 105 96 - 112 mEq/L   CO2 29 19 - 32 mEq/L    Calcium 8.9 8.4 - 10.5 mg/dL   Albumin 3.9 3.5 - 5.2 g/dL   BUN 14 6 - 23 mg/dL   Creatinine, Ser 1.18 0.40 - 1.20 mg/dL   Glucose, Bld 156 (H) 70 - 99 mg/dL   Phosphorus 3.2 2.3 - 4.6 mg/dL   GFR 44.84 (L) >60.00 mL/min   Assessment & Plan:   Problem List Items Addressed This Visit    Elbow injury, right, initial encounter - Primary    FROM at elbow without pain not consistent with fracture. Anticipate suffered elbow contusion with soft tissue hematoma overlying bone. Supportive home care reviewed, elbow wrapped in compression wrap. rec tylenol, ice PRN.       Bursitis of right hip    Anticipate traumatic bursitis of R hip. Supportive care reviewed - ice, tylenol. Update if not improving with treatment.           No orders of the defined types were placed in this encounter.  No orders of the defined types were placed in this encounter.   Follow up plan: Return if symptoms worsen or fail to improve.  Ria Bush, MD

## 2018-09-20 ENCOUNTER — Ambulatory Visit: Payer: Medicare Other | Admitting: Family Medicine

## 2018-09-29 ENCOUNTER — Other Ambulatory Visit: Payer: Self-pay | Admitting: Family Medicine

## 2018-10-11 ENCOUNTER — Other Ambulatory Visit: Payer: Self-pay | Admitting: Family Medicine

## 2018-10-11 NOTE — Telephone Encounter (Signed)
Name of Medication: Lorazepam Name of Pharmacy: [facility] Last Fill or Written Date and Quantity: 09/10/18, #30 Last Office Visit and Type: 09/17/18, acute Next Office Visit and Type: none Last Controlled Substance Agreement Date: 08/08/17 Last UDS: 08/08/17

## 2018-10-13 NOTE — Telephone Encounter (Signed)
Eprescribed.

## 2018-10-25 ENCOUNTER — Telehealth: Payer: Self-pay | Admitting: *Deleted

## 2018-10-25 MED ORDER — HYDROXYZINE HCL 10 MG PO TABS: 10.0000 mg | ORAL_TABLET | Freq: Two times a day (BID) | ORAL | 1 refills | Status: DC | PRN

## 2018-10-25 NOTE — Telephone Encounter (Signed)
Patient called stating that she is a little stressed out today. Patient stated that she was on Lorazepam, but it was stopped for some reason.  Patent stated that she does not want to start back on that medication because it can cause memory problems. Patient stated that she would like something mild called in for her anxiety, but does not want any medication that may cause memory problems. Patient stated that she is okay if Dr. Darnell Level can not do it, but to let her know. Pharmacy [facility]

## 2018-10-25 NOTE — Telephone Encounter (Signed)
Last phoned in lorazepam 10/13/2018 so probably has refill available at pharmacy.  Agree ok to try less potent hydroxyzine PRN stress/anxiety. This is not habit forming nor will it affect memory , only side effect to watch is sedation and dry mouth. Sent this to pharmacy as well.

## 2018-10-25 NOTE — Telephone Encounter (Signed)
Spoke with pt relaying Dr. G's message.  Pt verbalizes understanding and expresses her thanks.  

## 2018-11-10 ENCOUNTER — Ambulatory Visit: Payer: Medicare Other | Admitting: Family Medicine

## 2018-11-10 DIAGNOSIS — Z0289 Encounter for other administrative examinations: Secondary | ICD-10-CM

## 2018-11-25 ENCOUNTER — Telehealth: Payer: Self-pay | Admitting: Family Medicine

## 2018-11-25 NOTE — Telephone Encounter (Signed)
plz clarify - she should continue to take levothyroxine for her thyroid.  Does she mean lorazepam?? Ok to try off hydroxyzine.

## 2018-11-25 NOTE — Telephone Encounter (Signed)
Patient stated that she went to the pharmacy to pick up a prescription.  She stated this was for the Levothyroxine that she no longer takes. Patient stated she has not opened the bottle and would like to know what she could do with the medication since she doesn't need it,   Patient also stated that she has on tablet left of the Hydroxyzine and wants to take herself off for a little while to see how she does without it. She wasn't sure if there was any major side effects from going without the medication   C/B # [phone]

## 2018-11-26 NOTE — Telephone Encounter (Signed)
Left message for pt to call back  °

## 2018-11-26 NOTE — Telephone Encounter (Signed)
Pt returning call.  I asked about levothyroxine.  Pt confirms she is still taking med.  Says she already has plenty and did not realize that was the refill she picked up until after opening it.  Also, it is the lorazepam she no longer takes.  Now on hydroxyzine.  I relayed Dr. Synthia Innocent message.  Pt verbalizes understanding and expresses her thanks.

## 2018-11-29 ENCOUNTER — Other Ambulatory Visit: Payer: Self-pay | Admitting: Family Medicine

## 2018-12-08 ENCOUNTER — Telehealth: Payer: Self-pay | Admitting: *Deleted

## 2018-12-08 NOTE — Telephone Encounter (Signed)
Patient called stating that she is having problems sleeping and wants to know if any of her medications could be causing that? Patient stated that she does drink a lot of caffeine all day. Advised patient that caffeine may be causing her not to be able to sleep and advised her to cut back on that and verbalized understanding. Patient stated that she started yesterday with a runny nose and stuffy head. Patient stated that she does not have a fever. Patient stated that she was out most of the day yesterday and does not always wear a mask. Patient wants to know what Dr. Danise Mina recommends that she take for her symptoms?

## 2018-12-08 NOTE — Telephone Encounter (Signed)
Agree with limiting caffeine to start. She may try antihistamine like claritin or zyrtec for her congestion symptoms. Also do recommend continued flonase use.  Let us know if worsening symptoms or fever.

## 2018-12-09 ENCOUNTER — Emergency Department: Payer: Medicare Other

## 2018-12-09 ENCOUNTER — Encounter: Payer: Self-pay | Admitting: Emergency Medicine

## 2018-12-09 ENCOUNTER — Observation Stay
Admission: EM | Admit: 2018-12-09 | Discharge: 2018-12-11 | Disposition: A | Discharge status: Home / Self Care | Payer: Medicare Other | Attending: Internal Medicine | Admitting: Internal Medicine

## 2018-12-09 ENCOUNTER — Telehealth: Payer: Self-pay

## 2018-12-09 ENCOUNTER — Other Ambulatory Visit: Payer: Self-pay

## 2018-12-09 DIAGNOSIS — E039 Hypothyroidism, unspecified: Secondary | ICD-10-CM | POA: Diagnosis not present

## 2018-12-09 DIAGNOSIS — R7989 Other specified abnormal findings of blood chemistry: Secondary | ICD-10-CM | POA: Diagnosis present

## 2018-12-09 DIAGNOSIS — Z20828 Contact with and (suspected) exposure to other viral communicable diseases: Secondary | ICD-10-CM | POA: Diagnosis not present

## 2018-12-09 DIAGNOSIS — F4323 Adjustment disorder with mixed anxiety and depressed mood: Secondary | ICD-10-CM | POA: Diagnosis not present

## 2018-12-09 DIAGNOSIS — Z888 Allergy status to other drugs, medicaments and biological substances status: Secondary | ICD-10-CM | POA: Diagnosis not present

## 2018-12-09 DIAGNOSIS — I959 Hypotension, unspecified: Secondary | ICD-10-CM | POA: Diagnosis present

## 2018-12-09 DIAGNOSIS — A419 Sepsis, unspecified organism: Principal | ICD-10-CM | POA: Insufficient documentation

## 2018-12-09 DIAGNOSIS — M8580 Other specified disorders of bone density and structure, unspecified site: Secondary | ICD-10-CM | POA: Diagnosis not present

## 2018-12-09 DIAGNOSIS — Z79890 Hormone replacement therapy (postmenopausal): Secondary | ICD-10-CM | POA: Insufficient documentation

## 2018-12-09 DIAGNOSIS — Z882 Allergy status to sulfonamides status: Secondary | ICD-10-CM | POA: Insufficient documentation

## 2018-12-09 DIAGNOSIS — I248 Other forms of acute ischemic heart disease: Secondary | ICD-10-CM | POA: Insufficient documentation

## 2018-12-09 DIAGNOSIS — I129 Hypertensive chronic kidney disease with stage 1 through stage 4 chronic kidney disease, or unspecified chronic kidney disease: Secondary | ICD-10-CM | POA: Insufficient documentation

## 2018-12-09 DIAGNOSIS — N183 Chronic kidney disease, stage 3 (moderate): Secondary | ICD-10-CM | POA: Insufficient documentation

## 2018-12-09 DIAGNOSIS — R410 Disorientation, unspecified: Secondary | ICD-10-CM | POA: Diagnosis not present

## 2018-12-09 DIAGNOSIS — R778 Other specified abnormalities of plasma proteins: Secondary | ICD-10-CM

## 2018-12-09 DIAGNOSIS — Z881 Allergy status to other antibiotic agents status: Secondary | ICD-10-CM | POA: Diagnosis not present

## 2018-12-09 DIAGNOSIS — Z8249 Family history of ischemic heart disease and other diseases of the circulatory system: Secondary | ICD-10-CM | POA: Diagnosis not present

## 2018-12-09 DIAGNOSIS — Z79899 Other long term (current) drug therapy: Secondary | ICD-10-CM | POA: Insufficient documentation

## 2018-12-09 DIAGNOSIS — R0602 Shortness of breath: Secondary | ICD-10-CM | POA: Diagnosis not present

## 2018-12-09 DIAGNOSIS — I471 Supraventricular tachycardia: Secondary | ICD-10-CM | POA: Insufficient documentation

## 2018-12-09 DIAGNOSIS — Z7982 Long term (current) use of aspirin: Secondary | ICD-10-CM | POA: Diagnosis not present

## 2018-12-09 DIAGNOSIS — Z85828 Personal history of other malignant neoplasm of skin: Secondary | ICD-10-CM | POA: Diagnosis not present

## 2018-12-09 DIAGNOSIS — N390 Urinary tract infection, site not specified: Secondary | ICD-10-CM | POA: Diagnosis not present

## 2018-12-09 HISTORY — DX: Sepsis, unspecified organism: A41.9

## 2018-12-09 LAB — COMPREHENSIVE METABOLIC PANEL
ALT: 21 U/L (ref 0–44)
AST: 24 U/L (ref 15–41)
Albumin: 3.4 g/dL — ABNORMAL LOW (ref 3.5–5.0)
Alkaline Phosphatase: 79 U/L (ref 38–126)
Anion gap: 10 (ref 5–15)
BUN: 14 mg/dL (ref 8–23)
CO2: 22 mmol/L (ref 22–32)
Calcium: 8.1 mg/dL — ABNORMAL LOW (ref 8.9–10.3)
Chloride: 111 mmol/L (ref 98–111)
Creatinine, Ser: 1.41 mg/dL — ABNORMAL HIGH (ref 0.44–1.00)
GFR calc Af Amer: 43 mL/min — ABNORMAL LOW (ref >60)
GFR calc non Af Amer: 37 mL/min — ABNORMAL LOW (ref >60)
Glucose, Bld: 147 mg/dL — ABNORMAL HIGH (ref 70–99)
Potassium: 3.4 mmol/L — ABNORMAL LOW (ref 3.5–5.1)
Sodium: 143 mmol/L (ref 135–145)
Total Bilirubin: 0.8 mg/dL (ref 0.3–1.2)
Total Protein: 6.3 g/dL — ABNORMAL LOW (ref 6.5–8.1)

## 2018-12-09 LAB — CBC WITH DIFFERENTIAL/PLATELET
Abs Immature Granulocytes: 0.02 10*3/uL (ref 0.00–0.07)
Basophils Absolute: 0.1 10*3/uL (ref 0.0–0.1)
Basophils Relative: 1 %
Eosinophils Absolute: 0.4 10*3/uL (ref 0.0–0.5)
Eosinophils Relative: 6 %
HCT: 45.1 % (ref 36.0–46.0)
Hemoglobin: 14.7 g/dL (ref 12.0–15.0)
Immature Granulocytes: 0 %
Lymphocytes Relative: 28 %
Lymphs Abs: 1.9 10*3/uL (ref 0.7–4.0)
MCH: 28.7 pg (ref 26.0–34.0)
MCHC: 32.6 g/dL (ref 30.0–36.0)
MCV: 88.1 fL (ref 80.0–100.0)
Monocytes Absolute: 0.5 10*3/uL (ref 0.1–1.0)
Monocytes Relative: 8 %
Neutro Abs: 3.8 10*3/uL (ref 1.7–7.7)
Neutrophils Relative %: 57 %
Platelets: 193 10*3/uL (ref 150–400)
RBC: 5.12 MIL/uL — ABNORMAL HIGH (ref 3.87–5.11)
RDW: 13.2 % (ref 11.5–15.5)
WBC: 6.7 10*3/uL (ref 4.0–10.5)
nRBC: 0 % (ref 0.0–0.2)

## 2018-12-09 LAB — URINALYSIS, ROUTINE W REFLEX MICROSCOPIC
Bilirubin Urine: NEGATIVE
Glucose, UA: 50 mg/dL — AB
Hgb urine dipstick: NEGATIVE
Ketones, ur: 5 mg/dL — AB
Nitrite: NEGATIVE
Protein, ur: 30 mg/dL — AB
Specific Gravity, Urine: 1.009 (ref 1.005–1.030)
pH: 7 (ref 5.0–8.0)

## 2018-12-09 LAB — TROPONIN I (HIGH SENSITIVITY)
Troponin I (High Sensitivity): 22 ng/L — ABNORMAL HIGH (ref <18)
Troponin I (High Sensitivity): 80 ng/L — ABNORMAL HIGH (ref <18)

## 2018-12-09 LAB — APTT: aPTT: 28 seconds (ref 24–36)

## 2018-12-09 LAB — PROTIME-INR
INR: 1.1 (ref 0.8–1.2)
Prothrombin Time: 13.8 seconds (ref 11.4–15.2)

## 2018-12-09 LAB — LACTIC ACID, PLASMA
Lactic Acid, Venous: 3 mmol/L (ref 0.5–1.9)
Lactic Acid, Venous: 1.7 mmol/L (ref 0.5–1.9)

## 2018-12-09 LAB — SARS CORONAVIRUS 2 BY RT PCR (HOSPITAL ORDER, PERFORMED IN ~~LOC~~ HOSPITAL LAB): SARS Coronavirus 2: NEGATIVE

## 2018-12-09 MED ORDER — ONDANSETRON HCL 4 MG/2ML IJ SOLN: 4.0000 mg | Freq: Four times a day (QID) | INTRAMUSCULAR | Status: DC | PRN

## 2018-12-09 MED ORDER — SODIUM CHLORIDE 0.9 % IV SOLN
1.0000 g | Freq: Once | INTRAVENOUS | Status: AC
  Administered 2018-12-09: 1 g via INTRAVENOUS
  Filled 2018-12-09 (×2): qty 1

## 2018-12-09 MED ORDER — HYDROXYZINE HCL 10 MG PO TABS
10.0000 mg | ORAL_TABLET | Freq: Two times a day (BID) | ORAL | Status: DC | PRN
  Administered 2018-12-10: 22:00:00 20 mg via ORAL
  Filled 2018-12-09 (×3): qty 2

## 2018-12-09 MED ORDER — HEPARIN SODIUM (PORCINE) 5000 UNIT/ML IJ SOLN
5000.0000 [IU] | Freq: Three times a day (TID) | INTRAMUSCULAR | Status: DC
  Administered 2018-12-09 – 2018-12-11 (×4): 5000 [IU] via SUBCUTANEOUS
  Filled 2018-12-09 (×4): qty 1

## 2018-12-09 MED ORDER — IOHEXOL 350 MG/ML SOLN
60.0000 mL | Freq: Once | INTRAVENOUS | Status: AC | PRN
  Administered 2018-12-09: 60 mL via INTRAVENOUS

## 2018-12-09 MED ORDER — ADENOSINE 6 MG/2ML IV SOLN
INTRAVENOUS | Status: AC
  Administered 2018-12-09: 6 mg
  Filled 2018-12-09: qty 2

## 2018-12-09 MED ORDER — ADENOSINE 12 MG/4ML IV SOLN
INTRAVENOUS | Status: AC
  Filled 2018-12-09: qty 4

## 2018-12-09 MED ORDER — ACETAMINOPHEN 650 MG RE SUPP: 650.0000 mg | Freq: Four times a day (QID) | RECTAL | Status: DC | PRN

## 2018-12-09 MED ORDER — ADULT MULTIVITAMIN W/MINERALS CH
1.0000 | ORAL_TABLET | Freq: Every day | ORAL | Status: DC
  Administered 2018-12-10 – 2018-12-11 (×2): 1 via ORAL
  Filled 2018-12-09 (×2): qty 1

## 2018-12-09 MED ORDER — ASPIRIN 81 MG PO CHEW
324.0000 mg | CHEWABLE_TABLET | Freq: Once | ORAL | Status: DC
  Filled 2018-12-09: qty 4

## 2018-12-09 MED ORDER — SODIUM CHLORIDE 0.9 % IV BOLUS
1000.0000 mL | Freq: Once | INTRAVENOUS | Status: AC
  Administered 2018-12-09: 1000 mL via INTRAVENOUS

## 2018-12-09 MED ORDER — ONDANSETRON HCL 4 MG PO TABS: 4.0000 mg | ORAL_TABLET | Freq: Four times a day (QID) | ORAL | Status: DC | PRN

## 2018-12-09 MED ORDER — CALCIUM CARBONATE-VITAMIN D 500-200 MG-UNIT PO TABS
1.0000 | ORAL_TABLET | Freq: Two times a day (BID) | ORAL | Status: DC
  Administered 2018-12-10 – 2018-12-11 (×3): 1 via ORAL
  Filled 2018-12-09 (×3): qty 1

## 2018-12-09 MED ORDER — VITAMIN E 180 MG (400 UNIT) PO CAPS
400.0000 [IU] | ORAL_CAPSULE | Freq: Every day | ORAL | Status: DC
  Administered 2018-12-10 – 2018-12-11 (×2): 400 [IU] via ORAL
  Filled 2018-12-09 (×3): qty 1

## 2018-12-09 MED ORDER — SODIUM CHLORIDE 0.9 % IV SOLN
1.0000 g | INTRAVENOUS | Status: DC
  Administered 2018-12-09 – 2018-12-10 (×2): 1 g via INTRAVENOUS
  Filled 2018-12-09: qty 1
  Filled 2018-12-09: qty 10
  Filled 2018-12-09: qty 1

## 2018-12-09 MED ORDER — ASPIRIN EC 325 MG PO TBEC
325.0000 mg | DELAYED_RELEASE_TABLET | Freq: Every day | ORAL | Status: DC
  Administered 2018-12-10: 325 mg via ORAL
  Filled 2018-12-09: qty 1

## 2018-12-09 MED ORDER — LEVOTHYROXINE SODIUM 50 MCG PO TABS
50.0000 ug | ORAL_TABLET | Freq: Every day | ORAL | Status: DC
  Administered 2018-12-10 – 2018-12-11 (×2): 50 ug via ORAL
  Filled 2018-12-09 (×2): qty 1

## 2018-12-09 MED ORDER — ACETAMINOPHEN 325 MG PO TABS: 650.0000 mg | ORAL_TABLET | Freq: Four times a day (QID) | ORAL | Status: DC | PRN

## 2018-12-09 MED ORDER — PAROXETINE HCL 20 MG PO TABS
20.0000 mg | ORAL_TABLET | Freq: Every day | ORAL | Status: DC
  Administered 2018-12-10 – 2018-12-11 (×2): 20 mg via ORAL
  Filled 2018-12-09 (×2): qty 1

## 2018-12-09 NOTE — ED Notes (Signed)
Resumed care from [place] rn.  Pt alert.  Iv meds infusing  nsr on monitor.  primedoc in with pt now.

## 2018-12-09 NOTE — ED Triage Notes (Signed)
Pt c/o cough with with green sputum for the past couple of days. States she took a 500mg  ASA this morning. Pt is in NAD. Respirations WNL

## 2018-12-09 NOTE — Telephone Encounter (Signed)
Noted. Thank you! Agree with ER eval!

## 2018-12-09 NOTE — ED Notes (Signed)
Attempted to call report at this time. Asked to call back in 70min
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
## 2018-12-09 NOTE — Telephone Encounter (Signed)
Pt calling for 2 days has prod cough with green phlegm; pt denies SOB but pt is very SOB on phone. Pt said no CP or H/A but pt said "I just don't feel good" pt could not expand on how pt does not feel good. Pt said she is sweating a lot and for no reason. Advised pt should go to ED now due to sweating a lot and very SOB. Pt said her son was there and put son on phone. Pts son said to call 911. I called 911 and they are enroute to pts home. Cancelled virtual visit that was already scheduled for cough on 12/10/18. FYI to Dr Darnell Level.

## 2018-12-09 NOTE — ED Notes (Signed)
Pt taken to CT.

## 2018-12-09 NOTE — Progress Notes (Signed)
CODE SEPSIS - PHARMACY COMMUNICATION  **Broad Spectrum Antibiotics should be administered within 1 hour of Sepsis diagnosis**  Time Code Sepsis Called/Page Received: 1101  Antibiotics Ordered: none at this time.   Time of 1st antibiotic administration: n/a  Additional action taken by pharmacy: conferred with RN. MD is not sure antibiotics are warranted on this patient. Pharmacy will continue to follow in background.   If necessary, Name of Provider/Nurse Contacted: Bea Laura , RPh 12/09/2018  11:45 AM

## 2018-12-09 NOTE — ED Notes (Signed)
Pt moved to room 36.
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
## 2018-12-09 NOTE — Telephone Encounter (Signed)
Forgot to document yesterday before leaving.  But I had spoke with pt relaying Dr. Synthia Innocent message and pt verbalized understanding.

## 2018-12-09 NOTE — ED Notes (Signed)
Attempted to call report again at this time

## 2018-12-09 NOTE — ED Notes (Signed)
Report given to [facility] in c-pod.

## 2018-12-09 NOTE — Sepsis Progress Note (Signed)
Notified bedside nurse of need to administer antibiotics.  

## 2018-12-09 NOTE — ED Notes (Signed)
Rodena Piety: niece. [phone]

## 2018-12-10 ENCOUNTER — Ambulatory Visit: Payer: Medicare Other | Admitting: Family Medicine

## 2018-12-10 DIAGNOSIS — I248 Other forms of acute ischemic heart disease: Secondary | ICD-10-CM

## 2018-12-10 DIAGNOSIS — R410 Disorientation, unspecified: Secondary | ICD-10-CM | POA: Diagnosis not present

## 2018-12-10 DIAGNOSIS — I959 Hypotension, unspecified: Secondary | ICD-10-CM | POA: Diagnosis not present

## 2018-12-10 DIAGNOSIS — I471 Supraventricular tachycardia: Secondary | ICD-10-CM

## 2018-12-10 DIAGNOSIS — F309 Manic episode, unspecified: Secondary | ICD-10-CM

## 2018-12-10 LAB — LIPID PANEL
Cholesterol: 216 mg/dL — ABNORMAL HIGH (ref 0–200)
HDL: 53 mg/dL (ref >40)
LDL Cholesterol: 133 mg/dL — ABNORMAL HIGH (ref 0–99)
Total CHOL/HDL Ratio: 4.1 RATIO
Triglycerides: 148 mg/dL (ref <150)
VLDL: 30 mg/dL (ref 0–40)

## 2018-12-10 LAB — CBC
HCT: 40.1 % (ref 36.0–46.0)
Hemoglobin: 13.3 g/dL (ref 12.0–15.0)
MCH: 29 pg (ref 26.0–34.0)
MCHC: 33.2 g/dL (ref 30.0–36.0)
MCV: 87.6 fL (ref 80.0–100.0)
Platelets: 155 10*3/uL (ref 150–400)
RBC: 4.58 MIL/uL (ref 3.87–5.11)
RDW: 13.1 % (ref 11.5–15.5)
WBC: 6 10*3/uL (ref 4.0–10.5)
nRBC: 0 % (ref 0.0–0.2)

## 2018-12-10 LAB — URINE CULTURE: Culture: <10000 — AB

## 2018-12-10 LAB — BASIC METABOLIC PANEL
Anion gap: 6 (ref 5–15)
BUN: 12 mg/dL (ref 8–23)
CO2: 25 mmol/L (ref 22–32)
Calcium: 8.4 mg/dL — ABNORMAL LOW (ref 8.9–10.3)
Chloride: 112 mmol/L — ABNORMAL HIGH (ref 98–111)
Creatinine, Ser: 1.08 mg/dL — ABNORMAL HIGH (ref 0.44–1.00)
GFR calc Af Amer: 59 mL/min — ABNORMAL LOW (ref >60)
GFR calc non Af Amer: 51 mL/min — ABNORMAL LOW (ref >60)
Glucose, Bld: 119 mg/dL — ABNORMAL HIGH (ref 70–99)
Potassium: 4 mmol/L (ref 3.5–5.1)
Sodium: 143 mmol/L (ref 135–145)

## 2018-12-10 LAB — TROPONIN I (HIGH SENSITIVITY)
Troponin I (High Sensitivity): 188 ng/L (ref <18)
Troponin I (High Sensitivity): 103 ng/L (ref <18)

## 2018-12-10 LAB — LACTIC ACID, PLASMA: Lactic Acid, Venous: 1.3 mmol/L (ref 0.5–1.9)

## 2018-12-10 MED ORDER — HALOPERIDOL LACTATE 5 MG/ML IJ SOLN: 2.5000 mg | Freq: Four times a day (QID) | INTRAMUSCULAR | Status: DC | PRN

## 2018-12-10 MED ORDER — ASPIRIN EC 81 MG PO TBEC
81.0000 mg | DELAYED_RELEASE_TABLET | Freq: Every day | ORAL | Status: DC
  Administered 2018-12-11: 81 mg via ORAL
  Filled 2018-12-10: qty 1

## 2018-12-10 NOTE — Telephone Encounter (Signed)
Noted! Thank you
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
## 2018-12-10 NOTE — Progress Notes (Signed)
[facility] [place] NAME: Karen [name]    MR#:  [id number]  DATE OF BIRTH:  [birth year]  SUBJECTIVE:  CHIEF COMPLAINT: Patient is walking in the hallway trying to exit the hospital with IV line.  Pleasantly confused  REVIEW OF SYSTEMS:  Limited ROS CONSTITUTIONAL: No fever, fatigue or weakness.  RESPIRATORY: No cough, shortness of breath, wheezing or hemoptysis.  CARDIOVASCULAR: No chest pain, orthopnea, edema.  GASTROINTESTINAL: No nausea, vomiting, diarrhea or abdominal pain.  SKIN: No rash or lesion. MUSCULOSKELETAL: No joint pain or arthritis.   NEUROLOGIC: No tingling, numbness, weakness.  PSYCHIATRY: No anxiety or depression.   DRUG ALLERGIES:   Allergies  Allergen Reactions  . Macrodantin [Nitrofurantoin]     Per prior PCP chart  . Nortriptyline Other (See Comments)    Jittery after first pill  . Sulfa Antibiotics Rash    VITALS:  Blood pressure 118/69, pulse 70, temperature 98 F (36.7 C), temperature source Oral, resp. rate 18, height '5\' 3"'  (1.6 m), weight 73.3 kg, SpO2 98 %.  PHYSICAL EXAMINATION:  GENERAL:  73 y.o.-year-old patient lying in the bed with no acute distress.  EYES: Pupils equal, round, reactive to light and accommodation. No scleral icterus. Extraocular muscles intact.  HEENT: Head atraumatic, normocephalic. Oropharynx and nasopharynx clear.  NECK:  Supple, no jugular venous distention. No thyroid enlargement, no tenderness.  LUNGS: Normal breath sounds bilaterally, no wheezing, rales,rhonchi or crepitation. No use of accessory muscles of respiration.  CARDIOVASCULAR: S1, S2 normal. No murmurs, rubs, or gallops.  ABDOMEN: Soft, nontender, nondistended. Bowel sounds present. EXTREMITIES: No pedal edema, cyanosis, or clubbing.  NEUROLOGIC: Awake and alert and pleasantly confused PSYCHIATRIC: The patient is alert and oriented x 2  SKIN: No obvious rash, lesion, or ulcer.    LABORATORY PANEL:    CBC Recent Labs  Lab 12/10/18 0447  WBC 6.0  HGB 13.3  HCT 40.1  PLT 155   ------------------------------------------------------------------------------------------------------------------  Chemistries  Recent Labs  Lab 12/09/18 1144 12/10/18 0447  NA 143 143  K 3.4* 4.0  CL 111 112*  CO2 22 25  GLUCOSE 147* 119*  BUN 14 12  CREATININE 1.41* 1.08*  CALCIUM 8.1* 8.4*  AST 24  --   ALT 21  --   ALKPHOS 79  --   BILITOT 0.8  --    ------------------------------------------------------------------------------------------------------------------  Cardiac Enzymes No results for input(s): TROPONINI in the last 168 hours. ------------------------------------------------------------------------------------------------------------------  RADIOLOGY:  Ct Angio Chest Pe W And/or Wo Contrast  Result Date: 12/09/2018 CLINICAL DATA:  Shortness of breath, high lactate, green phlegm, chest x-ray negative EXAM: CT ANGIOGRAPHY CHEST WITH CONTRAST TECHNIQUE: Multidetector CT imaging of the chest was performed using the standard protocol during bolus administration of intravenous contrast. Multiplanar CT image reconstructions and MIPs were obtained to evaluate the vascular anatomy. CONTRAST:  42m OMNIPAQUE IOHEXOL 350 MG/ML SOLN COMPARISON:  Same-day radiograph FINDINGS: Cardiovascular: Satisfactory opacification of the pulmonary arteries to the segmental level. No evidence of pulmonary embolism or pulmonary artery enlargement. Normal heart size. No pericardial effusion. Atherosclerotic plaque within the normal caliber aorta. Shared origin of the brachiocephalic and left common carotid artery. Minimal plaque within the proximal great vessels. Major venous structures are grossly unremarkable. Mediastinum/Nodes: No enlarged mediastinal or axillary lymph nodes. Thyroid gland, trachea, and esophagus demonstrate no significant findings. Lungs/Pleura: Mild airways thickening. Dependent atelectasis  seen posteriorly. More bandlike areas of opacity likely reflect additional subsegmental atelectasis or scarring. No consolidation, features of edema,  pneumothorax, or effusion. No suspicious pulmonary nodules or masses. Upper Abdomen: No acute abnormalities present in the visualized portions of the upper abdomen. 11 cm fluid attenuation probable hepatic cyst in the right lobe liver (4/74). Musculoskeletal: Multilevel degenerative changes are present in the imaged portions of the spine. Exaggerated thoracic kyphosis. No acute or suspicious osseous lesions. Review of the MIP images confirms the above findings. IMPRESSION: No evidence of pulmonary embolism to the segmental level. Mild diffuse airways thickening may reflect either acute or chronic bronchitic change. No consolidative process seen. Aortic Atherosclerosis (ICD10-I70.0). Electronically Signed   By: Lovena Le M.D.   On: 12/09/2018 13:50   Dg Chest Port 1 View  Result Date: 12/09/2018 CLINICAL DATA:  Hypertension. Cough. EXAM: PORTABLE CHEST 1 VIEW COMPARISON:  None available. FINDINGS: Mildly enlarged cardiac silhouette. There is no evidence of focal airspace consolidation, pleural effusion or pneumothorax. Osseous structures are without acute abnormality. Soft tissues are grossly normal. IMPRESSION: 1. Mildly enlarged cardiac silhouette. 2. No evidence of airspace consolidation. Electronically Signed   By: Fidela Salisbury M.D.   On: 12/09/2018 11:17    EKG:   Orders placed or performed during the hospital encounter of 12/09/18  . ED EKG 12-Lead  . ED EKG 12-Lead  . EKG 12-Lead  . EKG 12-Lead  . EKG 12-Lead  . EKG 12-Lead    ASSESSMENT AND PLAN:   73 year old female known history of anxiety, depression, CKD stage III who presents to the hospital due to shortness of breath.   1.  Sepsis-patient met criteria admission given her hypotension, elevated lactic acid and urinalysis positive for a UTI. -  CT chest and also a chest x-ray  which showed no evidence of acute pneumonia. -IV fluids, IV ceftriaxone for the UTI, follow hemodynamics and cultures.  2.  Urinary tract infection-source of patient's sepsis.  Patient's urinalysis is positive but she is clinically asymptomatic. - We will treat the patient with IV ceftriaxone for 3 days and follow cultures.  3.  Elevated troponin-secondary supply demand ischemia from the potential sepsis -We will continue to follow cardiac markers until they are downtrending -Pending echocardiogram -.  Patient has no acute cardiac symptoms. -Seen by cardiology Dr. Rockey Situ  4.  Hypothyroidism-continue Synthroid.  5.  Anxiety-continue Paxil.     All the records are reviewed and case discussed with Care Management/Social Workerr. Management plans discussed with the patient, daughter and they are in agreement.  CODE STATUS: fc   TOTAL TIME TAKING CARE OF THIS PATIENT: 36 minutes.   POSSIBLE D/C IN 1-2 DAYS, DEPENDING ON CLINICAL CONDITION.  Note: This dictation was prepared with Dragon dictation along with smaller phrase technology. Any transcriptional errors that result from this process are unintentional.   Nicholes Mango M.D on 12/10/2018 at 3:26 PM  Between 7am to 6pm - Pager - [phone] After 6pm go to [email or web address] - password [email or web address]  [facility]  Office  [phone]  CC: Primary care physician; Ria Bush, MD

## 2018-12-10 NOTE — Telephone Encounter (Signed)
Pt is in the hospital; a nurse came in pts room and was advised could go home today; then a doctor came in room and said she was going to need to stay in hospital due to something being wrong with urine specimen. Pt wants to come to [facility] for urine specimen. Pt got nurse on phone and nurse said pt needs to have additional lab testing done that pts troponin is trending up, I spoke back with pt and advised the additional testing is to check to see if troponin is still elevated which could be a heart issue. Pt said she would get testing done; I asked pt to lay down in bed and rest and not get excited or anxious and pt said OK if I would let Dr Darnell Level know what was going on. I gave verbal report to Dr Darnell Level who agreed with above and sent note. FYI to Dr Darnell Level.

## 2018-12-11 ENCOUNTER — Observation Stay: Admit: 2018-12-11 | Payer: Medicare Other

## 2018-12-11 MED ORDER — INFLUENZA VAC A&B SA ADJ QUAD 0.5 ML IM PRSY: 0.5000 mL | PREFILLED_SYRINGE | INTRAMUSCULAR | Status: DC

## 2018-12-11 MED ORDER — CEPHALEXIN 500 MG PO CAPS: 500.0000 mg | ORAL_CAPSULE | Freq: Three times a day (TID) | ORAL | 0 refills | Status: AC

## 2018-12-11 MED ORDER — CEPHALEXIN 500 MG PO CAPS
500.0000 mg | ORAL_CAPSULE | Freq: Three times a day (TID) | ORAL | Status: DC
  Administered 2018-12-11: 09:00:00 500 mg via ORAL
  Filled 2018-12-11: qty 1
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
## 2018-12-11 NOTE — Plan of Care (Signed)

## 2018-12-11 NOTE — Plan of Care (Signed)

## 2018-12-13 ENCOUNTER — Telehealth: Payer: Self-pay

## 2018-12-13 LAB — HEMOGLOBIN A1C
Hgb A1c MFr Bld: 5.8 % — ABNORMAL HIGH (ref 4.8–5.6)
Mean Plasma Glucose: 120 mg/dL

## 2018-12-13 NOTE — Telephone Encounter (Signed)
[place] Night - Client TELEPHONE ADVICE RECORD AccessNurse Patient Name: [facility] [place] Gender: Female DOB: 12/27/1945 Age: 73 Y 9 M 1 D Return Phone Number: [phone] (Secondary) Address: City/State/Zip: [place] Client [place] Night - Client Client Site [place] Physician Ria Bush - MD Contact Type Call Who Is Calling Patient / Member / Family / Caregiver Call Type Triage / Clinical Relationship To Patient Self Return Phone Number [phone] (Secondary) Chief Complaint Health information question (non symptomatic) Reason for Call Request to Speak to a Physician Initial Comment Caller states that she is returning a call from Dr. Danise Mina. Caller states she was just informed that she has heart problems and needs to urgently speak to Dr. Danise Mina. He called his dtr and call was missed. She is currently in [facility] but will be back in [place] tomorrow. Would like a call back soon from provider. No symptoms to report. Translation No Nurse Assessment Guidelines Guideline Title Affirmed Question Affirmed Notes Nurse Date/Time (Eastern Time) Disp. Time [name] Time) Disposition Final User 12/10/2018 5:56:34 PM Send To RN Personal Richardson Landry, RN, Samantha 12/10/2018 6:13:42 PM Attempt made - message left McClarnon, RN, [facility] 12/10/2018 6:34:21 PM FINAL ATTEMPT MADE - message left Yes McClarnon, RN, Earnest Bailey

## 2018-12-13 NOTE — Telephone Encounter (Signed)
I left message on cell VM (okay per DPR) for patient to please call us back to follow up from hospital.  Also, patient needs to make a hospital f/u with Dr. Danise Mina.   Please call as soon as possible.

## 2018-12-13 NOTE — Telephone Encounter (Signed)
Attempted to return pt's call.  Left message on vm for pt to call back.

## 2018-12-14 LAB — CULTURE, BLOOD (ROUTINE X 2)
Culture: NO GROWTH
Culture: NO GROWTH
Special Requests: ADEQUATE

## 2018-12-14 NOTE — Telephone Encounter (Signed)
Attempted to return pt's call.  Left message on vm for pt to call back.

## 2018-12-14 NOTE — Telephone Encounter (Signed)
LM X 2 on Cell and home machines.  Left detailed message on cell (okay per DPR) to please call us as soon as possible to follow up from hospitalization and to set up virtual f/u with Dr. Darnell Level this week.

## 2018-12-17 ENCOUNTER — Telehealth: Payer: Self-pay | Admitting: Family Medicine

## 2018-12-17 NOTE — Telephone Encounter (Signed)
Karen [name]'s daughter called to verify that you have received the reports from a test that [facility] ran on the patient's heart. She stated they were suppose to have this sent over but the patient and daughter have not heard if this was done   Daughter would like a call back if these have been received

## 2018-12-17 NOTE — Telephone Encounter (Signed)
Pt notified as instructed and voiced understanding. Pt said she does not feel one day over 73 years old. Pt will keep appt with Dr Darnell Level on 12/22/18 at 3:15.

## 2018-12-17 NOTE — Telephone Encounter (Signed)
Noted  

## 2018-12-17 NOTE — Telephone Encounter (Signed)
I have access to blood tests that look at heart done at hospital. Pt did not have heart ultrasound done or other imaging test besides initial CT of lungs.  Do recommend keep Wednesday hospital f/u, bring daughter with her to appt to review if possible.

## 2018-12-17 NOTE — Telephone Encounter (Signed)
Left message on vm for pt to call back.  Need to relay Dr. G's message.  

## 2018-12-20 ENCOUNTER — Other Ambulatory Visit: Payer: Self-pay | Admitting: Family Medicine

## 2018-12-21 NOTE — Telephone Encounter (Signed)
[place] Night - Client Nonclinical Telephone Record Access[facility] Client [facility] Night - Client Client Site [place] Physician Ria Bush - MD Contact Type Call Who Is Calling Patient / Member / Family / Caregiver Caller Name Chelsea Phone Number [phone] or [phone] Patient Name Karen [name] Patient DOB 1945-09-23 Call Type Message Only Information Provided Reason for Call Request for General Office Information Initial Comment Caller states she is not wanting her doctor to go back with her during her appointment. - General information/hours provided Additional Comment Call Closed By: Memory Argue Transaction Date/Time: 12/20/2018 5:09:41 PM (ET)

## 2018-12-21 NOTE — Telephone Encounter (Signed)
Pt has HFU on 12/22/18 at 3:15; I think the note should read pt does not want her daughter to go back with her not the doctor. Earlier in this note Dr Darnell Level had suggested the daughter come with pt if possible.

## 2018-12-22 ENCOUNTER — Other Ambulatory Visit: Payer: Self-pay

## 2018-12-22 ENCOUNTER — Encounter: Payer: Self-pay | Admitting: Family Medicine

## 2018-12-22 ENCOUNTER — Ambulatory Visit (INDEPENDENT_AMBULATORY_CARE_PROVIDER_SITE_OTHER): Payer: Medicare Other | Admitting: Family Medicine

## 2018-12-22 VITALS — BP 118/80 | HR 88 | Temp 97.9°F | Ht 63.0 in | Wt 163.0 lb

## 2018-12-22 DIAGNOSIS — F4323 Adjustment disorder with mixed anxiety and depressed mood: Secondary | ICD-10-CM

## 2018-12-22 DIAGNOSIS — I248 Other forms of acute ischemic heart disease: Secondary | ICD-10-CM

## 2018-12-22 DIAGNOSIS — A419 Sepsis, unspecified organism: Secondary | ICD-10-CM

## 2018-12-22 DIAGNOSIS — E039 Hypothyroidism, unspecified: Secondary | ICD-10-CM | POA: Diagnosis not present

## 2018-12-22 DIAGNOSIS — I471 Supraventricular tachycardia, unspecified: Secondary | ICD-10-CM | POA: Insufficient documentation

## 2018-12-22 DIAGNOSIS — R9431 Abnormal electrocardiogram [ECG] [EKG]: Secondary | ICD-10-CM

## 2018-12-22 DIAGNOSIS — K5909 Other constipation: Secondary | ICD-10-CM | POA: Diagnosis not present

## 2018-12-22 MED ORDER — ASPIRIN EC 81 MG PO TBEC: 81.0000 mg | DELAYED_RELEASE_TABLET | Freq: Every day | ORAL | Status: DC

## 2018-12-22 MED ORDER — MELATONIN 5 MG PO CAPS: 1.0000 | ORAL_CAPSULE | Freq: Every evening | ORAL | 0 refills | Status: DC | PRN

## 2018-12-22 NOTE — Assessment & Plan Note (Addendum)
On cardiology inpatient eval initial concern for mania however she endorses not acting herself during hospitalization, doesn't have full recollection of events that led to hospitalization. ?sepsis related. Continue paxil for now. Discussed trial melatonin for sleep, discussed possible future trial of mood stabilizer (ie abilify) to help restlessness, sleep.
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
## 2018-12-22 NOTE — Progress Notes (Signed)
This visit was conducted in person.  BP 118/80 (BP Location: Left Arm, Patient Position: Sitting, Cuff Size: Large)   Pulse 88   Temp 97.9 F (36.6 C)   Ht 5\' 3"  (1.6 m)   Wt 163 lb (73.9 kg)   SpO2 93%   BMI 28.87 kg/m    CC: hospital f/u visit Subjective:    Patient ID: Karen [name], female    DOB: 1945/08/30, 73 y.o.   MRN: [id number]  HPI: Karen [name] is a 73 y.o. female presenting on 12/22/2018 for Hospitalization Follow-up (UTI causing tachycardia.)   Recent hospitalization for dyspnea - she was in SVT requiring adenosine to break cycle. Found to have sepsis with elevated LA and signs of UTI but UCx returned negative as did blood cultures, treated with keflex. CT chest without signs of pneumonia. She had elevated troponin levels thought due to demand ischemia. Records reviewed.   She had cardiology eval, during this evaluation pt was confused and found wandering the halls. Suggested metoprolol succinate if ongoing rhythm trouble, possible manic symptoms. She admits she was confused during hospitalization and doesn't quite remember events that led to hospitalization. Never had urinary symptoms.   She admits to drinking a lot of pepsi.  She did not stay for echocardiogram.  Since home feels well. No chest pain, dyspnea, racing heart.  She endorses some ongoing difficulty falling asleep. Has always been restless. Denies trouble with memory.   Discharged on aspirin 325mg  - unclear why full dose aspirin. States she has not been taking as she has trouble with easy bleeding. She completed keflex course. She states she is not regular with her calcium or her levothyroxine.   DATE OF ADMISSION:  12/09/2018  DATE OF DISCHARGE: 12/11/2018 TCM hosp f/u phone call not completed despite multiple attempts.   Discharge Diagnosis: Sepsis on admission--resolved SOB--improved suspect mild bronchitis  Consults: cardiology     Relevant past medical, surgical, family and social history  reviewed and updated as indicated. Interim medical history since our last visit reviewed. Allergies and medications reviewed and updated. Outpatient Medications Prior to Visit  Medication Sig Dispense Refill  . Calcium Carb-Cholecalciferol (CALCIUM-VITAMIN D) 600-400 MG-UNIT TABS Take 1 tablet by mouth 2 (two) times daily.    . hydrOXYzine (ATARAX/VISTARIL) 10 MG tablet TAKE 1-2 TABLETS (10-20 MG TOTAL) BY MOUTH 2 (TWO) TIMES DAILY AS NEEDED FOR ANXIETY. 30 tablet 0  . levothyroxine (SYNTHROID) 50 MCG tablet Take 1 tablet (50 mcg total) by mouth daily. 90 tablet 1  . Multiple Vitamin (MULTIVITAMIN) tablet Take 1 tablet by mouth daily.    Marland Kitchen PARoxetine (PAXIL) 20 MG tablet Take 1 tablet (20 mg total) by mouth daily. 90 tablet 3  . vitamin E 200 UNIT capsule Take 400 Units by mouth daily.    Marland Kitchen aspirin 325 MG tablet Take 325 mg by mouth daily.     No facility-administered medications prior to visit.      Per HPI unless specifically indicated in ROS section below Review of Systems Objective:    BP 118/80 (BP Location: Left Arm, Patient Position: Sitting, Cuff Size: Large)   Pulse 88   Temp 97.9 F (36.6 C)   Ht 5\' 3"  (1.6 m)   Wt 163 lb (73.9 kg)   SpO2 93%   BMI 28.87 kg/m   Wt Readings from Last 3 Encounters:  12/22/18 163 lb (73.9 kg)  12/09/18 161 lb 9.6 oz (73.3 kg)  09/17/18 162 lb 4 oz (73.6 kg)  Physical Exam Vitals signs and nursing note reviewed.  Constitutional:      General: She is not in acute distress.    Appearance: Normal appearance. She is not ill-appearing.  HENT:     Mouth/Throat:     Mouth: Mucous membranes are moist.     Pharynx: Oropharynx is clear. No oropharyngeal exudate.  Eyes:     Extraocular Movements: Extraocular movements intact.     Pupils: Pupils are equal, round, and reactive to light.  Neck:     Musculoskeletal: Normal range of motion and neck supple.     Thyroid: No thyroid mass, thyromegaly or thyroid tenderness.     Vascular: No  carotid bruit.  Cardiovascular:     Rate and Rhythm: Regular rhythm. Tachycardia present.     Pulses: Normal pulses.     Heart sounds: Normal heart sounds. No murmur.     Comments: Mild tachy Pulmonary:     Effort: Pulmonary effort is normal. No respiratory distress.     Breath sounds: Normal breath sounds. No wheezing, rhonchi or rales.  Abdominal:     General: Abdomen is flat. Bowel sounds are normal. There is no distension.     Palpations: Abdomen is soft. There is no mass.     Tenderness: There is no abdominal tenderness. There is no guarding or rebound.     Hernia: No hernia is present.  Musculoskeletal:     Right lower leg: No edema.     Left lower leg: No edema.  Neurological:     General: No focal deficit present.     Mental Status: She is alert.  Psychiatric:        Mood and Affect: Mood normal.        Behavior: Behavior normal.        Judgment: Judgment normal.     Comments: Chronically circumferential        Results for orders placed or performed during the hospital encounter of 12/09/18  Blood Culture (routine x 2)   Specimen: BLOOD  Result Value Ref Range   Specimen Description BLOOD LEFT ANTECUBITAL    Special Requests      BOTTLES DRAWN AEROBIC AND ANAEROBIC Blood Culture adequate volume   Culture      NO GROWTH 5 DAYS Performed at [facility], [address] [place], [place] [address]    Report Status 12/14/2018 FINAL   Blood Culture (routine x 2)   Specimen: BLOOD  Result Value Ref Range   Specimen Description BLOOD BLOOD RIGHT HAND    Special Requests      Blood Culture results may not be optimal due to an inadequate volume of blood received in culture bottles   Culture      NO GROWTH 5 DAYS Performed at [facility], [address], [place], [place] [address]    Report Status 12/14/2018 FINAL   Urine culture   Specimen: In/Out Cath Urine  Result Value Ref Range   Specimen Description      IN/OUT CATH URINE Performed at  [facility], [address], [place], [place] [address]    Special Requests      NONE Performed at [facility], [address] [place], [place] [address]    Culture (A)     <10,000 COLONIES/mL INSIGNIFICANT GROWTH Performed at [facility], [place] [address], [place], [place] [address]    Report Status 12/10/2018 FINAL   SARS Coronavirus 2 [facility] order, Performed in [facility] [place]  hospital lab) Nasopharyngeal Nasopharyngeal Swab   Specimen: Nasopharyngeal Swab  Result Value Ref Range   SARS Coronavirus 2 NEGATIVE NEGATIVE  Lactic acid, plasma  Result Value Ref Range   Lactic Acid, Venous 3.0 (HH) 0.5 - 1.9 mmol/L  Lactic acid, plasma  Result Value Ref Range   Lactic Acid, Venous 1.7 0.5 - 1.9 mmol/L  CBC WITH DIFFERENTIAL  Result Value Ref Range   WBC 6.7 4.0 - 10.5 K/uL   RBC 5.12 (H) 3.87 - 5.11 MIL/uL   Hemoglobin 14.7 12.0 - 15.0 g/dL   HCT 45.1 36.0 - 46.0 %   MCV 88.1 80.0 - 100.0 fL   MCH 28.7 26.0 - 34.0 pg   MCHC 32.6 30.0 - 36.0 g/dL   RDW 13.2 11.5 - 15.5 %   Platelets 193 150 - 400 K/uL   nRBC 0.0 0.0 - 0.2 %   Neutrophils Relative % 57 %   Neutro Abs 3.8 1.7 - 7.7 K/uL   Lymphocytes Relative 28 %   Lymphs Abs 1.9 0.7 - 4.0 K/uL   Monocytes Relative 8 %   Monocytes Absolute 0.5 0.1 - 1.0 K/uL   Eosinophils Relative 6 %   Eosinophils Absolute 0.4 0.0 - 0.5 K/uL   Basophils Relative 1 %   Basophils Absolute 0.1 0.0 - 0.1 K/uL   Immature Granulocytes 0 %   Abs Immature Granulocytes 0.02 0.00 - 0.07 K/uL  Urinalysis, Routine w reflex microscopic  Result Value Ref Range   Color, Urine YELLOW (A) YELLOW   APPearance HAZY (A) CLEAR   Specific Gravity, Urine 1.009 1.005 - 1.030   pH 7.0 5.0 - 8.0   Glucose, UA 50 (A) NEGATIVE mg/dL   Hgb urine dipstick NEGATIVE NEGATIVE   Bilirubin Urine NEGATIVE NEGATIVE   Ketones, ur 5 (A) NEGATIVE mg/dL   Protein, ur 30 (A) NEGATIVE mg/dL   Nitrite NEGATIVE NEGATIVE   Leukocytes,Ua LARGE (A)  NEGATIVE   RBC / HPF 0-5 0 - 5 RBC/hpf   WBC, UA 21-50 0 - 5 WBC/hpf   Bacteria, UA RARE (A) NONE SEEN   Squamous Epithelial / LPF 0-5 0 - 5   Mucus PRESENT    Hyaline Casts, UA PRESENT    Granular Casts, UA PRESENT    Non Squamous Epithelial PRESENT (A) NONE SEEN  Comprehensive metabolic panel  Result Value Ref Range   Sodium 143 135 - 145 mmol/L   Potassium 3.4 (L) 3.5 - 5.1 mmol/L   Chloride 111 98 - 111 mmol/L   CO2 22 22 - 32 mmol/L   Glucose, Bld 147 (H) 70 - 99 mg/dL   BUN 14 8 - 23 mg/dL   Creatinine, Ser 1.41 (H) 0.44 - 1.00 mg/dL   Calcium 8.1 (L) 8.9 - 10.3 mg/dL   Total Protein 6.3 (L) 6.5 - 8.1 g/dL   Albumin 3.4 (L) 3.5 - 5.0 g/dL   AST 24 15 - 41 U/L   ALT 21 0 - 44 U/L   Alkaline Phosphatase 79 38 - 126 U/L   Total Bilirubin 0.8 0.3 - 1.2 mg/dL   GFR calc non Af Amer 37 (L) >60 mL/min   GFR calc Af Amer 43 (L) >60 mL/min   Anion gap 10 5 - 15  Protime-INR  Result Value Ref Range   Prothrombin Time 13.8 11.4 - 15.2 seconds   INR 1.1 0.8 - 1.2  APTT  Result Value Ref Range   aPTT 28 24 - 36 seconds  Basic metabolic panel  Result Value Ref Range   Sodium 143 135 - 145 mmol/L   Potassium 4.0 3.5 - 5.1 mmol/L   Chloride 112 (H) 98 - 111 mmol/L   CO2 25 22 - 32 mmol/L   Glucose, Bld 119 (H) 70 - 99 mg/dL   BUN 12 8 - 23 mg/dL   Creatinine, Ser 1.08 (H) 0.44 - 1.00 mg/dL   Calcium 8.4 (L) 8.9 - 10.3 mg/dL   GFR calc non Af Amer 51 (L) >60 mL/min   GFR calc Af Amer 59 (L) >60 mL/min   Anion gap 6 5 - 15  CBC  Result Value Ref Range   WBC 6.0 4.0 - 10.5 K/uL   RBC 4.58 3.87 - 5.11 MIL/uL   Hemoglobin 13.3 12.0 - 15.0 g/dL   HCT 40.1 36.0 - 46.0 %   MCV 87.6 80.0 - 100.0 fL   MCH 29.0 26.0 - 34.0 pg   MCHC 33.2 30.0 - 36.0 g/dL   RDW 13.1 11.5 - 15.5 %   Platelets 155 150 - 400 K/uL   nRBC 0.0 0.0 - 0.2 %  Lactic acid, plasma  Result Value Ref Range   Lactic Acid, Venous 1.3 0.5 - 1.9 mmol/L  Lipid panel  Result Value Ref Range   Cholesterol 216  (H) 0 - 200 mg/dL   Triglycerides 148 <150 mg/dL   HDL 53 >40 mg/dL   Total CHOL/HDL Ratio 4.1 RATIO   VLDL 30 0 - 40 mg/dL   LDL Cholesterol 133 (H) 0 - 99 mg/dL  Hemoglobin A1c  Result Value Ref Range   Hgb A1c MFr Bld 5.8 (H) 4.8 - 5.6 %   Mean Plasma Glucose 120 mg/dL  Troponin I (High Sensitivity)  Result Value Ref Range   Troponin I (High Sensitivity) 22 (H) <18 ng/L  Troponin I (High Sensitivity)  Result Value Ref Range   Troponin I (High Sensitivity) 80 (H) <18 ng/L  Troponin I (High Sensitivity)  Result Value Ref Range   Troponin I (High Sensitivity) 188 (HH) <18 ng/L  Troponin I (High Sensitivity)  Result Value Ref Range   Troponin I (High Sensitivity) 103 (HH) <18 ng/L   Lab Results  Component Value Date   TSH 2.05 05/26/2018    Assessment & Plan:   Problem List Items Addressed This Visit    SVT (supraventricular tachycardia) ([place])    Isolated episode of SVT that resolved with adenosine in ER. Cardiac enzymes elevated thought due to demand ischemia from SVT and urosepsis, now largely feeling better. Will update echo with abnormal EKG. Discussed monitoring for tachycardia, and if recurrent low threshold to start metoprolol succinate.       Relevant Medications   aspirin EC 81 MG tablet   Other Relevant Orders   ECHOCARDIOGRAM COMPLETE   Sepsis ([place]) - Primary    Sepsis presumed from UTI with associated AMS. Now largely resolved, feeling well.       Hypothyroidism (acquired)    Encouraged adherence to daily levothyroxine - has not been taking consistently.       Chronic constipation    This has improved on levothyroxine however she has not been taking regularly.       Adjustment disorder with mixed anxiety and depressed mood    On cardiology inpatient eval initial concern for mania however she endorses not acting herself during hospitalization, doesn't have full recollection of events that led to hospitalization. ?sepsis related. Continue paxil for now.  Discussed trial melatonin for sleep, discussed possible  future trial of mood stabilizer (ie abilify) to help restlessness, sleep.       Abnormal EKG    SVT in hospital, f/u EKG with LAD, poor R wave progression, and possible LAFB. Will check echo.       Relevant Orders   ECHOCARDIOGRAM COMPLETE    Other Visit Diagnoses    Demand ischemia ([place])       Relevant Medications   aspirin EC 81 MG tablet   Other Relevant Orders   ECHOCARDIOGRAM COMPLETE       Meds ordered this encounter  Medications  . aspirin EC 81 MG tablet    Sig: Take 1 tablet (81 mg total) by mouth daily.    Dispense:     Marland Kitchen Melatonin 5 MG CAPS    Sig: Take 1 capsule (5 mg total) by mouth at bedtime as needed (insomnia).    Refill:  0   Orders Placed This Encounter  Procedures  . ECHOCARDIOGRAM COMPLETE    Standing Status:   Future    Standing Expiration Date:   03/22/2020    Order Specific Question:   Where should this test be performed    Answer:   [facility]    Order Specific Question:   Perflutren DEFINITY (image enhancing agent) should be administered unless hypersensitivity or allergy exist    Answer:   Administer Perflutren    Order Specific Question:   Reason for exam-Echo    Answer:   Abnormal ECG  794.31 / R94.31    Order Specific Question:   Reason for exam-Echo    Answer:   Elevated Troponin    Follow up plan: Return for follow up visit.  Ria Bush, MD

## 2018-12-22 NOTE — Assessment & Plan Note (Addendum)
This has improved on levothyroxine however she has not been taking regularly.

## 2018-12-22 NOTE — Patient Instructions (Addendum)
Work on limiting caffeinated beverages - important to prevent racing heart. Increase water intake for heart and kidney health. I do want to check heart ultrasound.  Return in 1 month for follow up visit with labs.  Let me know if you start noticing shortness of breath or chest tightness.  Try melatonin 5mg  for sleep (over the counter).  Restart daily baby aspirin 81mg  daily.

## 2018-12-22 NOTE — Assessment & Plan Note (Signed)
Sepsis presumed from UTI with associated AMS. Now largely resolved, feeling well.

## 2018-12-22 NOTE — Assessment & Plan Note (Signed)
Isolated episode of SVT that resolved with adenosine in ER. Cardiac enzymes elevated thought due to demand ischemia from SVT and urosepsis, now largely feeling better. Will update echo with abnormal EKG. Discussed monitoring for tachycardia, and if recurrent low threshold to start metoprolol succinate.

## 2018-12-22 NOTE — Assessment & Plan Note (Signed)
Encouraged adherence to daily levothyroxine - has not been taking consistently.

## 2018-12-24 ENCOUNTER — Other Ambulatory Visit: Payer: Self-pay

## 2018-12-24 ENCOUNTER — Ambulatory Visit (HOSPITAL_COMMUNITY): Payer: Medicare Other | Attending: Cardiology

## 2018-12-24 DIAGNOSIS — R9431 Abnormal electrocardiogram [ECG] [EKG]: Secondary | ICD-10-CM | POA: Diagnosis not present

## 2018-12-24 DIAGNOSIS — I248 Other forms of acute ischemic heart disease: Secondary | ICD-10-CM

## 2018-12-24 DIAGNOSIS — I471 Supraventricular tachycardia: Secondary | ICD-10-CM

## 2018-12-27 ENCOUNTER — Telehealth: Payer: Self-pay | Admitting: Family Medicine

## 2018-12-27 NOTE — Telephone Encounter (Signed)
Patient advised and verbalized understanding 

## 2018-12-27 NOTE — Telephone Encounter (Signed)
Does patient need follow up on UTI re check?

## 2018-12-27 NOTE — Telephone Encounter (Signed)
Yes urine infection should have fully resolved as she completed keflex antibiotic course for this.  No need for recheck at this time.  However, let us know if recurrent trouble with racing heart or shortness of breath to consider daily heart medicine (metoprolol).  See results section for echo results.

## 2018-12-27 NOTE — Telephone Encounter (Signed)
Pt came by wants to know if UTI was cleared. She had a friend that  died from having a UTI and she is concerned.  CB  [phone]

## 2019-01-11 ENCOUNTER — Ambulatory Visit (INDEPENDENT_AMBULATORY_CARE_PROVIDER_SITE_OTHER): Payer: Medicare Other | Admitting: Family Medicine

## 2019-01-11 ENCOUNTER — Encounter: Payer: Self-pay | Admitting: Family Medicine

## 2019-01-11 ENCOUNTER — Other Ambulatory Visit: Payer: Self-pay

## 2019-01-11 DIAGNOSIS — M545 Low back pain, unspecified: Secondary | ICD-10-CM

## 2019-01-11 MED ORDER — HYDROXYZINE HCL 10 MG PO TABS: 10.0000 mg | ORAL_TABLET | Freq: Two times a day (BID) | ORAL | 3 refills | Status: DC | PRN

## 2019-01-11 NOTE — Progress Notes (Signed)
This visit was conducted in person.  BP 138/82   Pulse (!) 58   Temp (!) 97.5 F (36.4 C)   Resp 20   Ht 5\' 3"  (1.6 m)   Wt 162 lb (73.5 kg)   SpO2 94%   BMI 28.70 kg/m    CC: back pain Subjective:    Patient ID: Karen [name], female    DOB: 14-Jun-1945, 73 y.o.   MRN: [id number]  HPI: Karen [name] is a 73 y.o. female presenting on 01/11/2019 for Back Pain (x 1 week. Not sure what happened)   1 wk h/o lower back pain without known inciting trauma/injury.  No shooting pain down legs, numbness/weakness of legs, bowel/bladder accidents, fevers/chills. No abd pain or flank pain. No nausea/vomiting.   Treating with tylenol and advil.  Ongoing R olecranon bursitis since 08/2018.  She has been planting mums at home - this past 1+ week.     Relevant past medical, surgical, family and social history reviewed and updated as indicated. Interim medical history since our last visit reviewed. Allergies and medications reviewed and updated. Outpatient Medications Prior to Visit  Medication Sig Dispense Refill  . aspirin EC 81 MG tablet Take 1 tablet (81 mg total) by mouth daily.    . Calcium Carb-Cholecalciferol (CALCIUM-VITAMIN D) 600-400 MG-UNIT TABS Take 1 tablet by mouth 2 (two) times daily.    Marland Kitchen levothyroxine (SYNTHROID) 50 MCG tablet Take 1 tablet (50 mcg total) by mouth daily. 90 tablet 1  . Melatonin 5 MG CAPS Take 1 capsule (5 mg total) by mouth at bedtime as needed (insomnia).  0  . Multiple Vitamin (MULTIVITAMIN) tablet Take 1 tablet by mouth daily.    Marland Kitchen PARoxetine (PAXIL) 20 MG tablet Take 1 tablet (20 mg total) by mouth daily. 90 tablet 3  . vitamin E 200 UNIT capsule Take 400 Units by mouth daily.    . hydrOXYzine (ATARAX/VISTARIL) 10 MG tablet TAKE 1-2 TABLETS (10-20 MG TOTAL) BY MOUTH 2 (TWO) TIMES DAILY AS NEEDED FOR ANXIETY. 30 tablet 0   No facility-administered medications prior to visit.      Per HPI unless specifically indicated in ROS section below Review of  Systems Objective:    BP 138/82   Pulse (!) 58   Temp (!) 97.5 F (36.4 C)   Resp 20   Ht 5\' 3"  (1.6 m)   Wt 162 lb (73.5 kg)   SpO2 94%   BMI 28.70 kg/m   Wt Readings from Last 3 Encounters:  01/11/19 162 lb (73.5 kg)  12/22/18 163 lb (73.9 kg)  12/09/18 161 lb 9.6 oz (73.3 kg)    Physical Exam Vitals signs and nursing note reviewed.  Constitutional:      General: She is not in acute distress.    Appearance: Normal appearance. She is not ill-appearing.  Musculoskeletal: Normal range of motion.        General: No tenderness.     Right lower leg: No edema.     Left lower leg: No edema.     Comments:  No pain midline spine No paraspinous mm tenderness Neg SLR bilaterally. No pain with int/ext rotation at hip. Neg FABER. No pain at SIJ, GTB or sciatic notch bilaterally.   Skin:    General: Skin is warm and dry.     Findings: No rash.  Neurological:     General: No focal deficit present.     Mental Status: She is alert.     Comments: 5/5 strength  BLE  Psychiatric:        Mood and Affect: Mood normal.        Behavior: Behavior normal.        Assessment & Plan:   Problem List Items Addressed This Visit    Low back pain    Reassuring physical exam today.  Anticipate lumbar strain. Not consistent with fracture or HNP.  Supportive treatment reviewed - heating pad, tylenol, rest, provided with exercises from [facility] pt advisor.  Update if not improving with treatment. Pt agrees with plan.           Meds ordered this encounter  Medications  . hydrOXYzine (ATARAX/VISTARIL) 10 MG tablet    Sig: Take 1-2 tablets (10-20 mg total) by mouth 2 (two) times daily as needed for anxiety.    Dispense:  30 tablet    Refill:  3   No orders of the defined types were placed in this encounter.   Patient Instructions  I think you had a lumbar strain.  Treat with rest, tylenol 500mg  2-3 times a day as needed, heating pad to back, do exercises provided today Let us know if not  improving with this.  Limit advil use as they are cleared through the kidneys.   Follow up plan: Return if symptoms worsen or fail to improve.  Ria Bush, MD

## 2019-01-11 NOTE — Patient Instructions (Addendum)
I think you had a lumbar strain.  Treat with rest, tylenol 500mg  2-3 times a day as needed, heating pad to back, do exercises provided today Let us know if not improving with this.  Limit advil use as they are cleared through the kidneys.

## 2019-01-11 NOTE — Assessment & Plan Note (Signed)
Reassuring physical exam today.  Anticipate lumbar strain. Not consistent with fracture or HNP.  Supportive treatment reviewed - heating pad, tylenol, rest, provided with exercises from [facility] pt advisor.  Update if not improving with treatment. Pt agrees with plan.

## 2019-01-21 ENCOUNTER — Ambulatory Visit (INDEPENDENT_AMBULATORY_CARE_PROVIDER_SITE_OTHER): Payer: Medicare Other | Admitting: Family Medicine

## 2019-01-21 ENCOUNTER — Encounter: Payer: Self-pay | Admitting: Family Medicine

## 2019-01-21 ENCOUNTER — Other Ambulatory Visit: Payer: Self-pay

## 2019-01-21 VITALS — BP 126/64 | HR 61 | Temp 97.6°F | Ht 63.0 in | Wt 163.1 lb

## 2019-01-21 DIAGNOSIS — E039 Hypothyroidism, unspecified: Secondary | ICD-10-CM | POA: Diagnosis not present

## 2019-01-21 DIAGNOSIS — N1831 Chronic kidney disease, stage 3a: Secondary | ICD-10-CM | POA: Diagnosis not present

## 2019-01-21 DIAGNOSIS — M7021 Olecranon bursitis, right elbow: Secondary | ICD-10-CM | POA: Diagnosis not present

## 2019-01-21 LAB — RENAL FUNCTION PANEL
Albumin: 4 g/dL (ref 3.5–5.2)
BUN: 24 mg/dL — ABNORMAL HIGH (ref 6–23)
CO2: 27 mEq/L (ref 19–32)
Calcium: 9 mg/dL (ref 8.4–10.5)
Chloride: 106 mEq/L (ref 96–112)
Creatinine, Ser: 1.17 mg/dL (ref 0.40–1.20)
GFR: 45.23 mL/min — ABNORMAL LOW (ref >60.00)
Glucose, Bld: 105 mg/dL — ABNORMAL HIGH (ref 70–99)
Phosphorus: 3.4 mg/dL (ref 2.3–4.6)
Potassium: 3.8 mEq/L (ref 3.5–5.1)
Sodium: 142 mEq/L (ref 135–145)

## 2019-01-21 LAB — T4, FREE: Free T4: 0.61 ng/dL (ref 0.60–1.60)

## 2019-01-21 LAB — VITAMIN D 25 HYDROXY (VIT D DEFICIENCY, FRACTURES): VITD: 31.05 ng/mL (ref 30.00–100.00)

## 2019-01-21 LAB — TSH: TSH: 2.81 u[IU]/mL (ref 0.35–4.50)

## 2019-01-21 NOTE — Progress Notes (Signed)
This visit was conducted in person.  BP 126/64 (BP Location: Left Arm, Patient Position: Sitting, Cuff Size: Normal)   Pulse 61   Temp 97.6 F (36.4 C) (Temporal)   Ht 5\' 3"  (1.6 m)   Wt 163 lb 2 oz (74 kg)   SpO2 96%   BMI 28.90 kg/m    CC: 1 mo f/u visit Subjective:    Patient ID: Karen [name], female    DOB: [birth year], 73 y.o.   MRN: [id number]  HPI: Karen [name] is a 73 y.o. female presenting on 01/21/2019 for Follow-up (Here for 1 mo f/u.)   See prior notes for details.  Seen 10d ago with lower back pain thought lumbar strain - this is largely improved.   Hospitalized last month with urosepsis (largely asymptomatic) with SVT and elevated cardiac enzymes due to demand ischemia. Abnormal EKG with LAD, poor R wave progression, LAFB. Echocardiogram 11/2018 - EF 55-60%, mild LVH, LV impaired relaxation, no significant valvular disease.   Asks about taking [employer] works supplement.      Relevant past medical, surgical, family and social history reviewed and updated as indicated. Interim medical history since our last visit reviewed. Allergies and medications reviewed and updated. Outpatient Medications Prior to Visit  Medication Sig Dispense Refill  . aspirin EC 81 MG tablet Take 1 tablet (81 mg total) by mouth daily.    . Calcium Carb-Cholecalciferol (CALCIUM-VITAMIN D) 600-400 MG-UNIT TABS Take 1 tablet by mouth 2 (two) times daily.    . hydrOXYzine (ATARAX/VISTARIL) 10 MG tablet Take 1-2 tablets (10-20 mg total) by mouth 2 (two) times daily as needed for anxiety. 30 tablet 3  . levothyroxine (SYNTHROID) 50 MCG tablet Take 1 tablet (50 mcg total) by mouth daily. 90 tablet 1  . Melatonin 5 MG CAPS Take 1 capsule (5 mg total) by mouth at bedtime as needed (insomnia).  0  . Multiple Vitamin (MULTIVITAMIN) tablet Take 1 tablet by mouth daily.    Marland Kitchen PARoxetine (PAXIL) 20 MG tablet Take 1 tablet (20 mg total) by mouth daily. 90 tablet 3  . vitamin E 200 UNIT capsule Take  400 Units by mouth daily.     No facility-administered medications prior to visit.      Per HPI unless specifically indicated in ROS section below Review of Systems Objective:    BP 126/64 (BP Location: Left Arm, Patient Position: Sitting, Cuff Size: Normal)   Pulse 61   Temp 97.6 F (36.4 C) (Temporal)   Ht 5\' 3"  (1.6 m)   Wt 163 lb 2 oz (74 kg)   SpO2 96%   BMI 28.90 kg/m   Wt Readings from Last 3 Encounters:  01/21/19 163 lb 2 oz (74 kg)  01/11/19 162 lb (73.5 kg)  12/22/18 163 lb (73.9 kg)    Physical Exam Vitals signs and nursing note reviewed.  Constitutional:      General: She is not in acute distress.    Appearance: Normal appearance. She is not ill-appearing.  HENT:     Mouth/Throat:     Mouth: Mucous membranes are moist.     Pharynx: Oropharynx is clear. No posterior oropharyngeal erythema.  Cardiovascular:     Rate and Rhythm: Normal rate and regular rhythm.     Pulses: Normal pulses.     Heart sounds: Normal heart sounds. No murmur.  Pulmonary:     Effort: Pulmonary effort is normal. No respiratory distress.     Breath sounds: Normal breath sounds. No wheezing, rhonchi  or rales.  Musculoskeletal: Normal range of motion.        General: Swelling present.     Comments: R nontender or erythematous olecranon bursitis, chronic  Skin:    General: Skin is warm and dry.     Findings: No erythema or rash.  Neurological:     Mental Status: She is alert.  Psychiatric:        Mood and Affect: Mood normal.        Behavior: Behavior normal.       R olecranon bursitis drainage IC obtained and in chart.  Area prepped with betadine, anesthesia with ethyl chloride.  1 in 23g needle attached to 10cc syringe used to aspirate fluid.  6cc of sanguinous appearing synovial fluid aspirated, not cloudy. Fluid not sent for analysis. Pt tolerated procedure well.  Dressed in compression bandage.  Assessment & Plan:   Problem List Items Addressed This Visit    Olecranon  bursitis of right elbow    Chronic, present since 08/2018 fall onto elbow, and has not diminished in size. Will drain per pt request. Fluid consistent with traumatic bursitis. IC reviewed. Pt tolerated well. Compression wrap placed.       Hypothyroidism (acquired)    Compliant with levothyroxine daily - continue. Check TSH today.       Relevant Orders   TSH   T4, free   CKD (chronic kidney disease) stage 3, GFR 30-59 ml/min - Primary    Update labs today.       Relevant Orders   Renal function panel   VITAMIN D 25 Hydroxy (Vit-D Deficiency, Fractures)   Parathyroid hormone, intact (no Ca)       No orders of the defined types were placed in this encounter.  Orders Placed This Encounter  Procedures  . TSH  . Renal function panel  . VITAMIN D 25 Hydroxy (Vit-D Deficiency, Fractures)  . Parathyroid hormone, intact (no Ca)  . T4, free    Follow up plan: Return if symptoms worsen or fail to improve.  Ria Bush, MD

## 2019-01-21 NOTE — Patient Instructions (Signed)
Labs today Olecranon bursitis on right elbow aspirated today. Try to keep compression wrap on to prevent recurrence.   Elbow Bursitis  Bursitis is swelling and pain at the tip of the elbow. This happens when fluid builds up in a sac under the skin (bursa). This may also be called olecranon bursitis. What are the causes? Elbow bursitis may be caused by:  Elbow injury, such as falling onto the elbow.  Leaning on hard surfaces for long periods of time.  Infection from an injury that breaks the skin near the elbow.  A bone growth (spur) that forms at the tip of the elbow.  A medical condition that causes inflammation, such as gout or rheumatoid arthritis. Sometimes the cause is not known. What are the signs or symptoms? The first sign of elbow bursitis is usually swelling at the tip of the elbow. This can grow to be about the size of a golf ball. Swelling may start suddenly or develop gradually. Other symptoms may include:  Pain when bending or leaning on the elbow.  Not being able to move the elbow normally. If bursitis is caused by an infection, you may have:  Redness, warmth, and tenderness of the elbow.  Drainage of pus from the swollen area over the elbow, if the skin breaks open. How is this diagnosed? This condition may be diagnosed based on:  Your symptoms and medical history.  Any recent injuries you have had.  A physical exam.  X-rays to check for a bone spur or fracture.  Draining fluid from the bursa to test it for infection.  Blood tests to rule out gout or rheumatoid arthritis. How is this treated? Treatment for elbow bursitis depends on the cause. Treatment may include:  Medicines. These may include: ? Over-the-counter medicines to relieve pain and inflammation. ? Antibiotic medicines. ? Injections of anti-inflammatory medicines (steroids).  Draining fluid from the bursa.  Wrapping your elbow with a bandage.  Wearing elbow pads. If these treatments  do not help, you may need surgery to remove the bursa. Follow these instructions at home: Medicines  Take over-the-counter and prescription medicines only as told by your health care provider.  If you were prescribed an antibiotic medicine, take it as told by your health care provider. Do not stop taking the antibiotic even if you start to feel better. Managing pain, stiffness, and swelling   If directed, put ice on your elbow: ? Put ice in a plastic bag. ? Place a towel between your skin and the bag. ? Leave the ice on for 20 minutes, 2-3 times a day.  If your bursitis is caused by an injury, rest your elbow and wear your bandage as told by your health care provider.  Use elbow pads or elbow wraps to cushion your elbow as needed. General instructions  Avoid any activities that cause elbow pain. Ask your health care provider what activities are safe for you.  Keep all follow-up visits as told by your health care provider. This is important. Contact a health care provider if you have:  A fever.  Symptoms that do not get better with treatment.  Pain or swelling that: ? Gets worse. ? Goes away and then comes back.  Pus draining from your elbow. Get help right away if you have:  Trouble moving your arm, hand, or fingers. Summary  Elbow bursitis is inflammation of the fluid-filled sac (bursa) between the tip of your elbow bone (olecranon) and your skin.  Treatment for elbow bursitis depends  on the cause. It may include medicines to relieve pain and inflammation, antibiotic medicines, and draining fluid from your elbow.  Contact a health care provider if your symptoms do not get better with treatment, or if your symptoms go away and then come back. This information is not intended to replace advice given to you by your health care provider. Make sure you discuss any questions you have with your health care provider. Document Released: 04/16/2006 Document Revised: 02/27/2017  Document Reviewed: 02/24/2017 Elsevier Patient Education  2020 [employer].

## 2019-01-21 NOTE — Assessment & Plan Note (Signed)
Compliant with levothyroxine daily - continue. Check TSH today.

## 2019-01-21 NOTE — Assessment & Plan Note (Signed)
Update labs today 

## 2019-01-21 NOTE — Assessment & Plan Note (Signed)
Chronic, present since 08/2018 fall onto elbow, and has not diminished in size. Will drain per pt request. Fluid consistent with traumatic bursitis. IC reviewed. Pt tolerated well. Compression wrap placed.

## 2019-01-24 LAB — PARATHYROID HORMONE, INTACT (NO CA): PTH: 25 pg/mL (ref 14–64)

## 2019-01-25 ENCOUNTER — Telehealth: Payer: Self-pay

## 2019-01-25 NOTE — Telephone Encounter (Signed)
Ok to remove no show charge. Will forward to [facility]. Regarding elbow fluid - recommend continued use of compression wrap. Don't recommend intervention at this time at least for another several weeks. Continue to monitor for now.  And thank her for the scuppernong grapes!

## 2019-01-25 NOTE — Telephone Encounter (Signed)
Left v/m requesting pt to call [facility] to give [facility] billing #.

## 2019-01-25 NOTE — Telephone Encounter (Signed)
Called pt and asked her to call us back regarding the invoice since it is for a no show. This will then need to go to Dr. Darnell Level to see if he will take off charge and then to [facility] if charge needs to be removed.

## 2019-01-25 NOTE — Telephone Encounter (Signed)
Patient stated that she is not sure what happened with the missed appointment on 8/12. She stated that she had wrote on her calendar the day and time but did not write the Doctors name so she did not relieaze that it was an appointment in our office. Would you like to take the charge off?  Also, Patient stated that she still has fluid on her arm. It is still very puffy and feels like maybe there is more fluid in there

## 2019-01-25 NOTE — Telephone Encounter (Signed)
[name] - Client Nonclinical Telephone Record AccessNurse Client [facility] Night - Client Client Site [place] - Night Physician [facility] - PHYSICIAN, Verita Schneiders- MD Contact Type Call Who Is Calling Patient / Member / Family / Caregiver Caller Name [name] Phone Number [phone] Patient Name Karen [name] Patient DOB 1945/09/22 Call Type Message Only Information Provided Reason for Call Billing Question Initial Comment Caller states she has a billing question for appointment she did not attend, Additional Comment Office hours provided. Call Closed By: Ignacia Marvel Transaction Date/Time: 01/24/2019 5:45:40 PM (ET)

## 2019-01-26 NOTE — Telephone Encounter (Signed)
Spoke with pt relaying message pertaining to elbow fluid.  Pt verbalizes understanding.

## 2019-01-26 NOTE — Telephone Encounter (Signed)
An e-mail has been sent to charge correction requesting no show fee be removed.

## 2019-02-03 ENCOUNTER — Other Ambulatory Visit: Payer: Self-pay | Admitting: Family Medicine

## 2019-02-21 ENCOUNTER — Telehealth: Payer: Self-pay

## 2019-02-21 NOTE — Telephone Encounter (Signed)
Noted. Agree.

## 2019-02-21 NOTE — Telephone Encounter (Signed)
Pt left v/m; pt said that her sister who pt was around had seen her niece who has tested + for covid. pts sister and pt does not have any symptoms and pt has not seen the niece that tested positive. pts daughter told pt to call the office because she was not exposed to a person with covid; pt asked to disregard the call. I told pt to call us if any symptoms developed or she had other questions and pt was appreciative. FYI only to Dr Darnell Level.

## 2019-03-03 ENCOUNTER — Telehealth: Payer: Self-pay

## 2019-03-03 NOTE — Telephone Encounter (Signed)
Thank you. Agree with OV if ongoing pain.

## 2019-03-03 NOTE — Telephone Encounter (Signed)
Pt left v/m that Dr Darnell Level is aware of rt hip pain that goes down leg that has been going on for a while. Pt took a fall awhile back and pt wants to know since pain is continuing what should pt do. Pt has been taking tylenol. Pt is going out and will not be back until later this afternoon. Pt request cb.I spoke with pt; Pt cannot remember when fell. Offered appt to be seen and pt said she was just leaving her house and she will see how she does over the weekend and will call first of wk if hip is still bothering pt. FYI to Dr Darnell Level.

## 2019-03-03 NOTE — Telephone Encounter (Signed)
[place] said pt came by office to schedule appt with Dr Darnell Level due to lt leg being swollen. I called pt there is no redness noted in lt leg and no pain in lt leg. Pt said she is not in distress.No CP or SOB. UC & ED precautions given and pt voiced understanding.

## 2019-03-04 ENCOUNTER — Ambulatory Visit: Payer: Medicare Other | Admitting: Family Medicine

## 2019-03-09 ENCOUNTER — Other Ambulatory Visit: Payer: Medicare Other

## 2019-03-09 ENCOUNTER — Other Ambulatory Visit: Payer: Self-pay | Admitting: Family Medicine

## 2019-03-09 DIAGNOSIS — E785 Hyperlipidemia, unspecified: Secondary | ICD-10-CM

## 2019-03-09 DIAGNOSIS — N1831 Chronic kidney disease, stage 3a: Secondary | ICD-10-CM

## 2019-03-09 DIAGNOSIS — E039 Hypothyroidism, unspecified: Secondary | ICD-10-CM

## 2019-03-14 ENCOUNTER — Other Ambulatory Visit: Payer: Self-pay

## 2019-03-14 ENCOUNTER — Ambulatory Visit: Payer: Medicare Other

## 2019-03-14 ENCOUNTER — Telehealth: Payer: Self-pay

## 2019-03-14 NOTE — Telephone Encounter (Signed)
Called patient to complete her Medicare Wellness visit and patient stated that she did not have time to complete this visit. She was meeting someone for lunch. She wanted to complete this at her upcoming physical with the doctor. Appointment cancelled per patient request.

## 2019-03-16 ENCOUNTER — Encounter: Payer: Self-pay | Admitting: Family Medicine

## 2019-03-16 ENCOUNTER — Other Ambulatory Visit: Payer: Self-pay

## 2019-03-16 ENCOUNTER — Ambulatory Visit (INDEPENDENT_AMBULATORY_CARE_PROVIDER_SITE_OTHER): Payer: Medicare Other | Admitting: Family Medicine

## 2019-03-16 VITALS — BP 134/70 | HR 73 | Temp 98.0°F | Ht 62.5 in | Wt 164.0 lb

## 2019-03-16 DIAGNOSIS — Z0000 Encounter for general adult medical examination without abnormal findings: Secondary | ICD-10-CM | POA: Diagnosis not present

## 2019-03-16 DIAGNOSIS — Z7189 Other specified counseling: Secondary | ICD-10-CM

## 2019-03-16 DIAGNOSIS — E039 Hypothyroidism, unspecified: Secondary | ICD-10-CM

## 2019-03-16 DIAGNOSIS — E785 Hyperlipidemia, unspecified: Secondary | ICD-10-CM

## 2019-03-16 DIAGNOSIS — M8580 Other specified disorders of bone density and structure, unspecified site: Secondary | ICD-10-CM

## 2019-03-16 DIAGNOSIS — Z23 Encounter for immunization: Secondary | ICD-10-CM

## 2019-03-16 DIAGNOSIS — K5909 Other constipation: Secondary | ICD-10-CM

## 2019-03-16 DIAGNOSIS — R9431 Abnormal electrocardiogram [ECG] [EKG]: Secondary | ICD-10-CM

## 2019-03-16 DIAGNOSIS — N1831 Chronic kidney disease, stage 3a: Secondary | ICD-10-CM

## 2019-03-16 DIAGNOSIS — F4323 Adjustment disorder with mixed anxiety and depressed mood: Secondary | ICD-10-CM

## 2019-03-16 MED ORDER — PAROXETINE HCL 20 MG PO TABS: 20.0000 mg | ORAL_TABLET | Freq: Every day | ORAL | 3 refills | Status: DC

## 2019-03-16 MED ORDER — LEVOTHYROXINE SODIUM 50 MCG PO TABS: 50.0000 ug | ORAL_TABLET | Freq: Every day | ORAL | 3 refills | Status: DC

## 2019-03-16 NOTE — Assessment & Plan Note (Signed)
Significant improvement since starting levothyroxine

## 2019-03-16 NOTE — Assessment & Plan Note (Addendum)
Will need this updated - discuss next visit.  Continue cal/vit d and regular walking

## 2019-03-16 NOTE — Assessment & Plan Note (Signed)

## 2019-03-16 NOTE — Progress Notes (Signed)
This visit was conducted in person.  BP 134/70 (BP Location: Left Arm, Patient Position: Sitting, Cuff Size: Normal)   Pulse 73   Temp 98 F (36.7 C) (Temporal)   Ht 5' 2.5" (1.588 m)   Wt 164 lb (74.4 kg)   SpO2 94%   BMI 29.52 kg/m    CC: CPE Subjective:    Patient ID: Karen [name], female    DOB: 31-Mar-1946, 73 y.o.   MRN: [id number]  HPI: Karen [name] is a 73 y.o. female presenting on 03/16/2019 for Medicare Wellness   Did not see health advisor this year for medicare wellness visit.    Hearing Screening   125Hz  250Hz  500Hz  1000Hz  2000Hz  3000Hz  4000Hz  6000Hz  8000Hz   Right ear:   20 25 20  20     Left ear:   25 40 25  40      Visual Acuity Screening   Right eye Left eye Both eyes  Without correction: 20/25 20/20 20/20   With correction:         Office Visit from 03/16/2019 in [place] at [facility] Total Score  0      Fall Risk  03/16/2019 03/12/2018 02/17/2017 02/08/2016 02/07/2015  Falls in the past year? 1 0 No No Yes  Number falls in past yr: 0 - - - 1  Injury with Fall? 1 - - - No  tripped while at restaurant  Hospitalized 11/2018 with urosepsis (largely asymptomatic) with SVT and elevated cardiac enzymes due to demand inschemia. Abnormal EKG showing LAD, poor R wave progression, LAFB. Echocardiogram overall reassuring (impaired LV relaxation).   fhmx CAD. Older sister with dementia.   Some insomnia trouble, doing well on low dose hydralazine prn anxiety.   Preventative: COLONOSCOPY Date: 12/2014 1 polyp - will request recordsagaintoday ([name])(ROI signed) Mammogram 05/2018 birads1  Well woman - s/p hysterectomy for benign reasons. Ovaries removed as well. DEXADate: 11/2012 T score -2.4 AP spine Osteopenia.  Flushot yearly  Pneumovax 2014,prevnar 2015  Tdap 07/2016  Zostavax 2012 shingrix - discussed - to check with local pharmacy Advanced directives: thinks she has one at home. Asked to bring me copy. Doesn't want prolonged  life support, ok for reversible condition. HCPOA isTodd son then Butch Penny daughterbut this has not been updated.  Seat belt use discussed. Sunscreen use discussed. No changing moles on skin. 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 Interim medical history since our last visit reviewed. Allergies and medications reviewed and updated. Outpatient Medications Prior to Visit  Medication Sig Dispense Refill  . aspirin EC 81 MG tablet Take 1 tablet (81 mg total) by mouth daily.    . Calcium Carb-Cholecalciferol (CALCIUM-VITAMIN D) 600-400 MG-UNIT TABS Take 1 tablet by mouth 2 (two) times daily.    . hydrOXYzine (ATARAX/VISTARIL) 10 MG tablet Take 1-2 tablets (10-20 mg total) by mouth 2 (two) times daily as needed for anxiety. 30 tablet 3  . Multiple Vitamin (MULTIVITAMIN) tablet Take 1 tablet by mouth daily.    . vitamin E 200 UNIT capsule Take 400  Units by mouth daily.    Marland Kitchen levothyroxine (SYNTHROID) 50 MCG tablet Take 1 tablet (50 mcg total) by mouth daily. 90 tablet 1  . Melatonin 5 MG CAPS Take 1 capsule (5 mg total) by mouth at bedtime as needed (insomnia).  0  . PARoxetine (PAXIL) 20 MG tablet TAKE 1 TABLET BY MOUTH EVERY DAY 90 tablet 0   No facility-administered medications prior to visit.     Per HPI unless specifically indicated in ROS section below Review of Systems  Constitutional: Negative for activity change, appetite change, chills, fatigue, fever and unexpected weight change.  HENT: Negative for hearing loss.   Eyes: Negative for  visual disturbance.  Respiratory: Negative for cough, chest tightness, shortness of breath and wheezing.   Cardiovascular: Negative for chest pain, palpitations and leg swelling.  Gastrointestinal: Negative for abdominal distention, abdominal pain, blood in stool, constipation, diarrhea, nausea and vomiting.  Genitourinary: Negative for difficulty urinating and hematuria.  Musculoskeletal: Negative for arthralgias, myalgias and neck pain.  Skin: Negative for rash.  Neurological: Negative for dizziness, seizures, syncope and headaches.  Hematological: Negative for adenopathy. Does not bruise/bleed easily.  Psychiatric/Behavioral: Negative for dysphoric mood. The patient is not nervous/anxious.    Objective:    BP 134/70 (BP Location: Left Arm, Patient Position: Sitting, Cuff Size: Normal)   Pulse 73   Temp 98 F (36.7 C) (Temporal)   Ht 5' 2.5" (1.588 m)   Wt 164 lb (74.4 kg)   SpO2 94%   BMI 29.52 kg/m   Wt Readings from Last 3 Encounters:  03/16/19 164 lb (74.4 kg)  01/21/19 163 lb 2 oz (74 kg)  01/11/19 162 lb (73.5 kg)    Physical Exam Vitals and nursing note reviewed.  Constitutional:      General: She is not in acute distress.    Appearance: Normal appearance. She is well-developed. She is not ill-appearing.  HENT:     Head: Normocephalic and atraumatic.     Right Ear: Hearing, tympanic membrane, ear canal and external ear normal.     Left Ear: Hearing, tympanic membrane, ear canal and external ear normal.     Nose: Nose normal.     Mouth/Throat:     Pharynx: Uvula midline.  Eyes:     General: No scleral icterus.    Extraocular Movements: Extraocular movements intact.     Conjunctiva/sclera: Conjunctivae normal.     Pupils: Pupils are equal, round, and reactive to light.  Cardiovascular:     Rate and Rhythm: Normal rate and regular rhythm.     Pulses: Normal pulses.          Radial pulses are 2+ on the right side and 2+ on the left side.     Heart sounds: Normal  heart sounds. No murmur.  Pulmonary:     Effort: Pulmonary effort is normal. No respiratory distress.     Breath sounds: Normal breath sounds. No wheezing, rhonchi or rales.  Abdominal:     General: Abdomen is flat. Bowel sounds are normal. There is no distension.     Palpations: Abdomen is soft. There is no mass.     Tenderness: There is no abdominal tenderness. There is no guarding or rebound.     Hernia: No hernia is present.  Musculoskeletal:        General: Normal range of motion.     Cervical back: Normal range of motion and neck supple.     Right lower leg: No edema.  Left lower leg: No edema.  Lymphadenopathy:     Cervical: No cervical adenopathy.  Skin:    General: Skin is warm and dry.     Findings: No rash.  Neurological:     General: No focal deficit present.     Mental Status: She is alert and oriented to person, place, and time.     Comments:  CN grossly intact, station and gait intact Recall 3/3 Calculation 5/5 DLROW  Psychiatric:        Mood and Affect: Mood normal.        Behavior: Behavior normal.        Thought Content: Thought content normal.        Judgment: Judgment normal.       Results for orders placed or performed in visit on 01/21/19  TSH  Result Value Ref Range   TSH 2.81 0.35 - 4.50 uIU/mL  Renal function panel  Result Value Ref Range   Sodium 142 135 - 145 mEq/L   Potassium 3.8 3.5 - 5.1 mEq/L   Chloride 106 96 - 112 mEq/L   CO2 27 19 - 32 mEq/L   Calcium 9.0 8.4 - 10.5 mg/dL   Albumin 4.0 3.5 - 5.2 g/dL   BUN 24 (H) 6 - 23 mg/dL   Creatinine, Ser 1.17 0.40 - 1.20 mg/dL   Glucose, Bld 105 (H) 70 - 99 mg/dL   Phosphorus 3.4 2.3 - 4.6 mg/dL   GFR 45.23 (L) >60.00 mL/min  VITAMIN D 25 Hydroxy (Vit-D Deficiency, Fractures)  Result Value Ref Range   VITD 31.05 30.00 - 100.00 ng/mL  Parathyroid hormone, intact (no Ca)  Result Value Ref Range   PTH 25 14 - 64 pg/mL  T4, free  Result Value Ref Range   Free T4 0.61 0.60 - 1.60 ng/dL     Lab Results  Component Value Date   CHOL 216 (H) 12/10/2018   HDL 53 12/10/2018   LDLCALC 133 (H) 12/10/2018   LDLDIRECT 135.0 01/31/2015   TRIG 148 12/10/2018   CHOLHDL 4.1 12/10/2018     EKG - NSR rate 70, LAD with LAFB, p mitrale, normal intervals, no acute ST/T changes, unchanged from prior  Assessment & Plan:  This visit occurred during the SARS-CoV-2 public health emergency.  Safety protocols were in place, including screening questions prior to the visit, additional usage of staff PPE, and extensive cleaning of exam room while observing appropriate contact time as indicated for disinfecting solutions.   Problem List Items Addressed This Visit    Osteopenia    Will need this updated - discuss next visit.  Continue cal/vit d and regular walking      Medicare annual wellness visit, subsequent - Primary    I have personally reviewed the Medicare Annual Wellness questionnaire and have noted 1. The patient's medical and social history 2. Their use of alcohol, tobacco or illicit drugs 3. Their current medications and supplements 4. The patient's functional ability including ADL's, fall risks, home safety risks and hearing or visual impairment. Cognitive function has been assessed and addressed as indicated.  5. Diet and physical activity 6. Evidence for depression or mood disorders The patients weight, height, BMI have been recorded in the chart. I have made referrals, counseling and provided education to the patient based on review of the above and I have provided the pt with a written personalized care plan for preventive services. Provider list updated.. See scanned questionairre as needed for further documentation. Reviewed preventative protocols  and updated unless pt declined.       Hypothyroidism (acquired)    Chronic, stable on levothyroxine over th past year.       Relevant Medications   levothyroxine (SYNTHROID) 50 MCG tablet   HLD (hyperlipidemia)    Chronic.  Not on statin. Hesitant to add at current age.  The 10-year ASCVD risk score Mikey Bussing DC Brooke Bonito., et al., 2013) is: 15.9%   Values used to calculate the score:     Age: 70 years     Sex: Female     Is Non-Hispanic African American: No     Diabetic: No     Tobacco smoker: No     Systolic Blood Pressure: Q000111Q mmHg     Is BP treated: No     HDL Cholesterol: 53 mg/dL     Total Cholesterol: 216 mg/dL        Health maintenance examination    Preventative protocols reviewed and updated unless pt declined. Discussed healthy diet and lifestyle.  Will request colonoscopy records again.       Relevant Orders   EKG 12-Lead (Completed)   CKD (chronic kidney disease) stage 3, GFR 30-59 ml/min    Chronic, aware to avoid dark sodas and drink plenty of water.       Chronic constipation    Significant improvement since starting levothyroxine       Advanced care planning/counseling discussion    Advanced directives: thinks she has one at home. Asked to bring me copy. Doesn't want prolonged life support, ok for reversible condition. HCPOA isTodd son then Butch Penny daughterbut this has not been updated.       Adjustment disorder with mixed anxiety and depressed mood    Chronic, stable period on low dose paxil with hydroxyzine PRN anxiety       Abnormal EKG    Update EKG today. Pt asxs.       Other Visit Diagnoses    Need for influenza vaccination       Relevant Orders   Flu Vaccine QUAD High Dose(Fluad) (Completed)       Meds ordered this encounter  Medications  . PARoxetine (PAXIL) 20 MG tablet    Sig: Take 1 tablet (20 mg total) by mouth daily.    Dispense:  90 tablet    Refill:  3  . levothyroxine (SYNTHROID) 50 MCG tablet    Sig: Take 1 tablet (50 mcg total) by mouth daily.    Dispense:  90 tablet    Refill:  3   Orders Placed This Encounter  Procedures  . Flu Vaccine QUAD High Dose(Fluad)  . EKG 12-Lead    Patient instructions: Flu shot today  EKG today.  We will request  copy of colonoscopy (Dr Collene Mares 2016) Bring me a copy of your living will (advanced directives).  If interested, check with pharmacy about new 2 shot shingles series (shingrix). You have to wait 2 weeks from today's flu shot.  Return in 4 months for follow up visit.   Follow up plan: Return in about 4 months (around 07/15/2019), or if symptoms worsen or fail to improve, for follow up visit.  Ria Bush, MD

## 2019-03-16 NOTE — Assessment & Plan Note (Signed)
Advanced directives: thinks she has one at home. Asked to bring me copy. Doesn't want prolonged life support, ok for reversible condition. HCPOA isTodd son then Butch Penny daughterbut this has not been updated.

## 2019-03-16 NOTE — Patient Instructions (Addendum)
Flu shot today  EKG today.  We will request copy of colonoscopy (Dr Collene Mares 2016) Bring me a copy of your living will (advanced directives).  If interested, check with pharmacy about new 2 shot shingles series (shingrix). You have to wait 2 weeks from today's flu shot.  Return in 4 months for follow up visit.   Health Maintenance After Age 74 After age 53, you are at a higher risk for certain long-term diseases and infections as well as injuries from falls. Falls are a major cause of broken bones and head injuries in people who are older than age 34. Getting regular preventive care can help to keep you healthy and well. Preventive care includes getting regular testing and making lifestyle changes as recommended by your health care provider. Talk with your health care provider about:  Which screenings and tests you should have. A screening is a test that checks for a disease when you have no symptoms.  A diet and exercise plan that is right for you. What should I know about screenings and tests to prevent falls? Screening and testing are the best ways to find a health problem early. Early diagnosis and treatment give you the best chance of managing medical conditions that are common after age 74. Certain conditions and lifestyle choices may make you more likely to have a fall. Your health care provider may recommend:  Regular vision checks. Poor vision and conditions such as cataracts can make you more likely to have a fall. If you wear glasses, make sure to get your prescription updated if your vision changes.  Medicine review. Work with your health care provider to regularly review all of the medicines you are taking, including over-the-counter medicines. Ask your health care provider about any side effects that may make you more likely to have a fall. Tell your health care provider if any medicines that you take make you feel dizzy or sleepy.  Osteoporosis screening. Osteoporosis is a condition  that causes the bones to get weaker. This can make the bones weak and cause them to break more easily.  Blood pressure screening. Blood pressure changes and medicines to control blood pressure can make you feel dizzy.  Strength and balance checks. Your health care provider may recommend certain tests to check your strength and balance while standing, walking, or changing positions.  Foot health exam. Foot pain and numbness, as well as not wearing proper footwear, can make you more likely to have a fall.  Depression screening. You may be more likely to have a fall if you have a fear of falling, feel emotionally low, or feel unable to do activities that you used to do.  Alcohol use screening. Using too much alcohol can affect your balance and may make you more likely to have a fall. What actions can I take to lower my risk of falls? General instructions  Talk with your health care provider about your risks for falling. Tell your health care provider if: ? You fall. Be sure to tell your health care provider about all falls, even ones that seem minor. ? You feel dizzy, sleepy, or off-balance.  Take over-the-counter and prescription medicines only as told by your health care provider. These include any supplements.  Eat a healthy diet and maintain a healthy weight. A healthy diet includes low-fat dairy products, low-fat (lean) meats, and fiber from whole grains, beans, and lots of fruits and vegetables. Home safety  Remove any tripping hazards, such as rugs, cords, and  clutter.  Install safety equipment such as grab bars in bathrooms and safety rails on stairs.  Keep rooms and walkways well-lit. Activity   Follow a regular exercise program to stay fit. This will help you maintain your balance. Ask your health care provider what types of exercise are appropriate for you.  If you need a cane or walker, use it as recommended by your health care provider.  Wear supportive shoes that have  nonskid soles. Lifestyle  Do not drink alcohol if your health care provider tells you not to drink.  If you drink alcohol, limit how much you have: ? 0-1 drink a day for women. ? 0-2 drinks a day for men.  Be aware of how much alcohol is in your drink. In the [place] one drink equals one typical bottle of beer (12 oz), one-half glass of wine (5 oz), or one shot of hard liquor (1 oz).  Do not use any products that contain nicotine or tobacco, such as cigarettes and e-cigarettes. If you need help quitting, ask your health care provider. Summary  Having a healthy lifestyle and getting preventive care can help to protect your health and wellness after age 65.  Screening and testing are the best way to find a health problem early and help you avoid having a fall. Early diagnosis and treatment give you the best chance for managing medical conditions that are more common for people who are older than age 78.  Falls are a major cause of broken bones and head injuries in people who are older than age 8. Take precautions to prevent a fall at home.  Work with your health care provider to learn what changes you can make to improve your health and wellness and to prevent falls. This information is not intended to replace advice given to you by your health care provider. Make sure you discuss any questions you have with your health care provider. Document Released: 01/28/2017 Document Revised: 07/08/2018 Document Reviewed: 01/28/2017 Elsevier Patient Education  2020 [employer].

## 2019-03-16 NOTE — Assessment & Plan Note (Signed)
Chronic. Not on statin. Hesitant to add at current age.  The 10-year ASCVD risk score Mikey Bussing DC Brooke Bonito., et al., 2013) is: 15.9%   Values used to calculate the score:     Age: 73 years     Sex: Female     Is Non-Hispanic African American: No     Diabetic: No     Tobacco smoker: No     Systolic Blood Pressure: Q000111Q mmHg     Is BP treated: No     HDL Cholesterol: 53 mg/dL     Total Cholesterol: 216 mg/dL

## 2019-03-16 NOTE — Assessment & Plan Note (Signed)
Chronic, stable on levothyroxine over th past year.

## 2019-03-16 NOTE — Assessment & Plan Note (Signed)
Update EKG today. Pt asxs.

## 2019-03-16 NOTE — Assessment & Plan Note (Addendum)
Preventative protocols reviewed and updated unless pt declined. Discussed healthy diet and lifestyle.  Will request colonoscopy records again.

## 2019-03-16 NOTE — Assessment & Plan Note (Signed)
Chronic, stable period on low dose paxil with hydroxyzine PRN anxiety

## 2019-03-16 NOTE — Assessment & Plan Note (Signed)
Chronic, aware to avoid dark sodas and drink plenty of water.

## 2019-03-18 ENCOUNTER — Telehealth: Payer: Self-pay | Admitting: Family Medicine

## 2019-03-18 NOTE — Telephone Encounter (Signed)
Spoke with pt informing her she does not need labs at this time.  Verbalizes understanding.

## 2019-03-18 NOTE — Telephone Encounter (Signed)
No need for labs at this time. Can do labwork at next OV.  She was called yesterday to review EKG and already spoke with Lattie Haw.

## 2019-03-18 NOTE — Telephone Encounter (Signed)
Patient stated she received a phone call that requested her to call the office about lab work. She stated at her annual wellness visit she did not have blood work done and she is not sure if maybe this was a call requesting her to come in   Do you need the patient to schedule a lab only visit ?

## 2019-03-22 ENCOUNTER — Telehealth: Payer: Self-pay | Admitting: Family Medicine

## 2019-03-22 NOTE — Telephone Encounter (Signed)
Pt came by office and wanted to let Dr. Danise Mina know that she is having trouble sleeping. She states she was doing research on PARoxetine and saw where it could be a side effect of that. She is wondering if that is causing her to not be able to sleep. She can be reached at [phone]

## 2019-03-22 NOTE — Telephone Encounter (Signed)
I don't think this is due to her paroxetine (paxil) given she's been on it for a long time without trouble. Do recommend she take in the morning, not at night. She may try melatonin 5mg  at night time to help sleep as well as work on regular bedtime routine.

## 2019-03-23 ENCOUNTER — Encounter: Payer: Self-pay | Admitting: Family Medicine

## 2019-03-23 NOTE — Telephone Encounter (Signed)
Spoke with pt relaying Dr. G's message. Pt verbalizes understanding.  

## 2019-03-28 ENCOUNTER — Other Ambulatory Visit: Payer: Self-pay | Admitting: Family Medicine

## 2019-03-28 ENCOUNTER — Telehealth: Payer: Self-pay | Admitting: *Deleted

## 2019-03-28 NOTE — Telephone Encounter (Signed)
Patient left a voicemail stating that she was recently in and mentioned that she was having hip pain. Patient stated that the pain is going down to her leg. Patient stated that she fell some time ago and if this is related to the fall she may has done some damage to her hip and leg. Patient stated that she just needs to know who she should go see about this?

## 2019-03-29 NOTE — Telephone Encounter (Signed)
Sorry hip continues bothering her.  Can return to see me in office or could see our sports medicine doctor Dr Lorelei Pont in office.

## 2019-03-29 NOTE — Telephone Encounter (Signed)
Last filled on 01/11/2019 #30 with 3 refills. LOV 03/16/2019 AWE  Next appointment on 07/18/2019 for follow up

## 2019-03-29 NOTE — Telephone Encounter (Signed)
Spoke with pt relaying Dr. Synthia Innocent message.  Pt asked to see Dr. Lorelei Pont.  Scheduled on 04/13/18 at 2:00.  Says she wants to give it a week or so to see if pain resolves.

## 2019-04-07 ENCOUNTER — Telehealth: Payer: Self-pay | Admitting: Family Medicine

## 2019-04-07 NOTE — Telephone Encounter (Signed)
Lvm asking pt to call back.  Need to relay Dr. G's message.  

## 2019-04-07 NOTE — Telephone Encounter (Signed)
Patient called.  Patient was awake all night until 5 am.  Patient went to sleep after she took 2 anxiety pills. Patient took the first anxiety pill around 3 am and took the second pill around 5 am and went to sleep.  Patient said it's happened twice. She said it happened a couple weeks ago. Patient said she doesn't usually have trouble sleeping. Patient will be gone most of the day.  Patient can be called on her cell phone [phone]. Patient uses [facility]. It was hard to get much information, but she did leave a message on the triage line, also.

## 2019-04-07 NOTE — Telephone Encounter (Signed)
Message on triage line stated the same thing as what Morey Hummingbird mentioned. Patient also said in the message that she can not keep having this problem that she can not sleep till early morning. Can any of her medications be contributing to this? Anything she can do for this?

## 2019-04-07 NOTE — Telephone Encounter (Signed)
None of her medicine should cause trouble sleeping.  Continue hydroxyzine PRN, ok to do this.  Could also try melatonin 5-10mg  at night for sleep.   Ensure she has good bedtime routine as per below recommendations: 1. Avoid naps during the day 2. Avoid stimulants such as caffeine and nicotine. Avoid bedtime alcohol (it can speed onset of sleep but the body's metabolism can cause awakenings). 3. All forms of exercise help ensure sound sleep - limit vigorous exercise to morning or late afternoon 4. Avoid food too close to bedtime including chocolate (which contains caffeine) 5. Soak up natural light 6. Establish regular bedtime routine. 7. Associate bed with sleep - avoid TV, computer or phone, reading while in bed. 8. Ensure pleasant, relaxing sleep environment - quiet, dark, cool room.

## 2019-04-08 ENCOUNTER — Telehealth: Payer: Self-pay

## 2019-04-08 ENCOUNTER — Telehealth: Payer: Self-pay | Admitting: Family Medicine

## 2019-04-08 NOTE — Telephone Encounter (Signed)
Patient asked me if she had the COVID vaccine here during her last visit and I advised patient it was the Flu shot and that we do not have COVID vaccines at our location at this time. Patient did not mention about been tested.

## 2019-04-08 NOTE — Telephone Encounter (Signed)
Spoke with pt relaying Dr. G's message.  Pt verbalizes understanding and expresses her thanks.  

## 2019-04-08 NOTE — Telephone Encounter (Signed)
Patient left message stating she has been having back pain for a couple of days or so but has been taking Tylenol for it. Today she has been sweating but no fever. Just keeps sweating and patient wants to know what to do with this.  Spoke with patient. Patient states she is feeling ok and does not have any other issues. Patient states she is fine and thanks [place] for the call. She thinks it is the shirt she is wearing. She will keep [place] posted if she needs anything.  FYI to Dr Darnell Level

## 2019-04-08 NOTE — Telephone Encounter (Signed)
Spoke with pt explaining she was not been tested at her last OV with [place] and that we do not do COVID testing here.  Verbalizes understanding and expresses her thanks for call back.

## 2019-04-08 NOTE — Telephone Encounter (Signed)
Patient would like to know if she has previously tested for covid.  She asked if this would have been done at her appt

## 2019-04-11 DIAGNOSIS — M5386 Other specified dorsopathies, lumbar region: Secondary | ICD-10-CM | POA: Diagnosis not present

## 2019-04-11 DIAGNOSIS — M7631 Iliotibial band syndrome, right leg: Secondary | ICD-10-CM | POA: Diagnosis not present

## 2019-04-11 DIAGNOSIS — M9904 Segmental and somatic dysfunction of sacral region: Secondary | ICD-10-CM | POA: Diagnosis not present

## 2019-04-11 DIAGNOSIS — M9903 Segmental and somatic dysfunction of lumbar region: Secondary | ICD-10-CM | POA: Diagnosis not present

## 2019-04-11 DIAGNOSIS — M461 Sacroiliitis, not elsewhere classified: Secondary | ICD-10-CM | POA: Diagnosis not present

## 2019-04-13 NOTE — Progress Notes (Deleted)
     Karen Axe T. Austen Oyster, MD Primary Care and Sports Medicine [facility] at [facility] [place], [address] Phone: [phone]  FAX: [place] - 74 y.o. female  MRN [id number]  Date of Birth: 1945-04-30  Visit Date: 04/14/2019  PCP: Ria Bush, MD  Referred by: Ria Bush, MD  No chief complaint on file.   This visit occurred during the SARS-CoV-2 public health emergency.  Safety protocols were in place, including screening questions prior to the visit, additional usage of staff PPE, and extensive cleaning of exam room while observing appropriate contact time as indicated for disinfecting solutions.   Subjective:   Karen [name] is a 75 y.o. very pleasant female patient with There is no height or weight on file to calculate BMI. who presents with the following:  She presents with some ongoing R hip pain.  Past Medical History, Surgical History, Social History, Family History, Problem List, Medications, and Allergies have been reviewed and updated if relevant.   GEN: No fevers, chills. Nontoxic. Primarily MSK c/o today. MSK: Detailed in the HPI GI: tolerating PO intake without difficulty Neuro: No numbness, parasthesias, or tingling associated. Otherwise the pertinent positives of the ROS are noted above.   Objective:   There were no vitals taken for this visit.  ***  Radiology: No results found.  Assessment and Plan:   ***

## 2019-04-14 ENCOUNTER — Ambulatory Visit: Payer: Medicare Other | Admitting: Family Medicine

## 2019-04-14 DIAGNOSIS — Z0289 Encounter for other administrative examinations: Secondary | ICD-10-CM

## 2019-04-15 DIAGNOSIS — M461 Sacroiliitis, not elsewhere classified: Secondary | ICD-10-CM | POA: Diagnosis not present

## 2019-04-15 DIAGNOSIS — M7631 Iliotibial band syndrome, right leg: Secondary | ICD-10-CM | POA: Diagnosis not present

## 2019-04-15 DIAGNOSIS — M9904 Segmental and somatic dysfunction of sacral region: Secondary | ICD-10-CM | POA: Diagnosis not present

## 2019-04-15 DIAGNOSIS — M5386 Other specified dorsopathies, lumbar region: Secondary | ICD-10-CM | POA: Diagnosis not present

## 2019-04-15 DIAGNOSIS — M9903 Segmental and somatic dysfunction of lumbar region: Secondary | ICD-10-CM | POA: Diagnosis not present

## 2019-04-18 DIAGNOSIS — M5386 Other specified dorsopathies, lumbar region: Secondary | ICD-10-CM | POA: Diagnosis not present

## 2019-04-18 DIAGNOSIS — M9904 Segmental and somatic dysfunction of sacral region: Secondary | ICD-10-CM | POA: Diagnosis not present

## 2019-04-18 DIAGNOSIS — M461 Sacroiliitis, not elsewhere classified: Secondary | ICD-10-CM | POA: Diagnosis not present

## 2019-04-18 DIAGNOSIS — M7631 Iliotibial band syndrome, right leg: Secondary | ICD-10-CM | POA: Diagnosis not present

## 2019-04-18 DIAGNOSIS — M9903 Segmental and somatic dysfunction of lumbar region: Secondary | ICD-10-CM | POA: Diagnosis not present

## 2019-04-22 DIAGNOSIS — M9904 Segmental and somatic dysfunction of sacral region: Secondary | ICD-10-CM | POA: Diagnosis not present

## 2019-04-22 DIAGNOSIS — M461 Sacroiliitis, not elsewhere classified: Secondary | ICD-10-CM | POA: Diagnosis not present

## 2019-04-22 DIAGNOSIS — M7631 Iliotibial band syndrome, right leg: Secondary | ICD-10-CM | POA: Diagnosis not present

## 2019-04-22 DIAGNOSIS — M5386 Other specified dorsopathies, lumbar region: Secondary | ICD-10-CM | POA: Diagnosis not present

## 2019-04-22 DIAGNOSIS — M9903 Segmental and somatic dysfunction of lumbar region: Secondary | ICD-10-CM | POA: Diagnosis not present

## 2019-04-25 DIAGNOSIS — M5386 Other specified dorsopathies, lumbar region: Secondary | ICD-10-CM | POA: Diagnosis not present

## 2019-04-25 DIAGNOSIS — M7631 Iliotibial band syndrome, right leg: Secondary | ICD-10-CM | POA: Diagnosis not present

## 2019-04-25 DIAGNOSIS — M9903 Segmental and somatic dysfunction of lumbar region: Secondary | ICD-10-CM | POA: Diagnosis not present

## 2019-04-25 DIAGNOSIS — M461 Sacroiliitis, not elsewhere classified: Secondary | ICD-10-CM | POA: Diagnosis not present

## 2019-04-25 DIAGNOSIS — M9904 Segmental and somatic dysfunction of sacral region: Secondary | ICD-10-CM | POA: Diagnosis not present

## 2019-04-26 DIAGNOSIS — M7631 Iliotibial band syndrome, right leg: Secondary | ICD-10-CM | POA: Diagnosis not present

## 2019-04-26 DIAGNOSIS — M9903 Segmental and somatic dysfunction of lumbar region: Secondary | ICD-10-CM | POA: Diagnosis not present

## 2019-04-26 DIAGNOSIS — M5386 Other specified dorsopathies, lumbar region: Secondary | ICD-10-CM | POA: Diagnosis not present

## 2019-04-26 DIAGNOSIS — M461 Sacroiliitis, not elsewhere classified: Secondary | ICD-10-CM | POA: Diagnosis not present

## 2019-04-26 DIAGNOSIS — M9904 Segmental and somatic dysfunction of sacral region: Secondary | ICD-10-CM | POA: Diagnosis not present

## 2019-05-02 DIAGNOSIS — M9903 Segmental and somatic dysfunction of lumbar region: Secondary | ICD-10-CM | POA: Diagnosis not present

## 2019-05-02 DIAGNOSIS — M5386 Other specified dorsopathies, lumbar region: Secondary | ICD-10-CM | POA: Diagnosis not present

## 2019-05-02 DIAGNOSIS — M9904 Segmental and somatic dysfunction of sacral region: Secondary | ICD-10-CM | POA: Diagnosis not present

## 2019-05-02 DIAGNOSIS — M461 Sacroiliitis, not elsewhere classified: Secondary | ICD-10-CM | POA: Diagnosis not present

## 2019-05-02 DIAGNOSIS — M7631 Iliotibial band syndrome, right leg: Secondary | ICD-10-CM | POA: Diagnosis not present

## 2019-05-09 DIAGNOSIS — M9904 Segmental and somatic dysfunction of sacral region: Secondary | ICD-10-CM | POA: Diagnosis not present

## 2019-05-09 DIAGNOSIS — M9903 Segmental and somatic dysfunction of lumbar region: Secondary | ICD-10-CM | POA: Diagnosis not present

## 2019-05-09 DIAGNOSIS — M461 Sacroiliitis, not elsewhere classified: Secondary | ICD-10-CM | POA: Diagnosis not present

## 2019-05-09 DIAGNOSIS — M5386 Other specified dorsopathies, lumbar region: Secondary | ICD-10-CM | POA: Diagnosis not present

## 2019-05-09 DIAGNOSIS — M7631 Iliotibial band syndrome, right leg: Secondary | ICD-10-CM | POA: Diagnosis not present

## 2019-05-13 ENCOUNTER — Ambulatory Visit: Payer: Medicare PPO | Attending: Internal Medicine

## 2019-05-13 DIAGNOSIS — Z23 Encounter for immunization: Secondary | ICD-10-CM | POA: Insufficient documentation

## 2019-05-16 DIAGNOSIS — M9903 Segmental and somatic dysfunction of lumbar region: Secondary | ICD-10-CM | POA: Diagnosis not present

## 2019-05-16 DIAGNOSIS — M9904 Segmental and somatic dysfunction of sacral region: Secondary | ICD-10-CM | POA: Diagnosis not present

## 2019-05-16 DIAGNOSIS — M7631 Iliotibial band syndrome, right leg: Secondary | ICD-10-CM | POA: Diagnosis not present

## 2019-05-16 DIAGNOSIS — M5386 Other specified dorsopathies, lumbar region: Secondary | ICD-10-CM | POA: Diagnosis not present

## 2019-05-16 DIAGNOSIS — M461 Sacroiliitis, not elsewhere classified: Secondary | ICD-10-CM | POA: Diagnosis not present

## 2019-05-23 DIAGNOSIS — M9903 Segmental and somatic dysfunction of lumbar region: Secondary | ICD-10-CM | POA: Diagnosis not present

## 2019-05-23 DIAGNOSIS — M5386 Other specified dorsopathies, lumbar region: Secondary | ICD-10-CM | POA: Diagnosis not present

## 2019-05-23 DIAGNOSIS — M7631 Iliotibial band syndrome, right leg: Secondary | ICD-10-CM | POA: Diagnosis not present

## 2019-05-23 DIAGNOSIS — M9904 Segmental and somatic dysfunction of sacral region: Secondary | ICD-10-CM | POA: Diagnosis not present

## 2019-06-08 ENCOUNTER — Ambulatory Visit: Payer: Medicare PPO | Attending: Internal Medicine

## 2019-06-08 DIAGNOSIS — Z23 Encounter for immunization: Secondary | ICD-10-CM | POA: Insufficient documentation

## 2019-06-08 NOTE — Progress Notes (Signed)
   Covid-19 Vaccination Clinic  Name:  Karen [name]    MRN: [id number] DOB: 1945-11-27  06/08/2019  Karen [name] was observed post Covid-19 immunization for 15 minutes without incident. She was provided with Vaccine Information Sheet and instruction to access the V-Safe system.   Karen [name] was instructed to call 911 with any severe reactions post vaccine: Marland Kitchen Difficulty breathing  . Swelling of face and throat  . A fast heartbeat  . A bad rash all over body  . Dizziness and weakness   Immunizations Administered    Name Date Dose VIS Date Route   Pfizer COVID-19 Vaccine 06/08/2019 10:37 AM 0.3 mL 03/11/2019 Intramuscular   Manufacturer: [place]   Lot: [id number]   [place]: [id number]

## 2019-06-13 DIAGNOSIS — M9903 Segmental and somatic dysfunction of lumbar region: Secondary | ICD-10-CM | POA: Diagnosis not present

## 2019-06-13 DIAGNOSIS — M9904 Segmental and somatic dysfunction of sacral region: Secondary | ICD-10-CM | POA: Diagnosis not present

## 2019-06-13 DIAGNOSIS — M7631 Iliotibial band syndrome, right leg: Secondary | ICD-10-CM | POA: Diagnosis not present

## 2019-06-13 DIAGNOSIS — M5386 Other specified dorsopathies, lumbar region: Secondary | ICD-10-CM | POA: Diagnosis not present

## 2019-06-22 ENCOUNTER — Telehealth: Payer: Self-pay

## 2019-06-22 MED ORDER — HYDROCORTISONE ACETATE 25 MG RE SUPP: RECTAL | 0 refills | Status: DC

## 2019-06-22 NOTE — Telephone Encounter (Signed)
Pt said she has ongoing hemorrhoids, now pt has hemorrhoids on the inside and the outside and pt request refill of hydrocortisone supp 25 mg; last filled # 12 x 0 on 11/30/2018 or if Dr Darnell Level thinks pt needs a cream since they are outside and inside to [place]. Pt had normal BM this morning with no blood in commode but slight amt of blood on toilet tissue. 03/16/19 last seen for [place].

## 2019-06-22 NOTE — Telephone Encounter (Signed)
Suppositories sent to pharmacy.  Let us know if not improving with treatment.

## 2019-06-23 NOTE — Telephone Encounter (Signed)
Left message for patient to call back  

## 2019-06-24 NOTE — Telephone Encounter (Signed)
Lvm asking pt to call back.  Need to relay Dr. G's message.  

## 2019-06-27 NOTE — Telephone Encounter (Signed)
Spoke with pt relaying Dr. G's message.  Verbalizes understanding.  

## 2019-07-03 ENCOUNTER — Other Ambulatory Visit: Payer: Self-pay | Admitting: Family Medicine

## 2019-07-04 ENCOUNTER — Other Ambulatory Visit: Payer: Self-pay | Admitting: Family Medicine

## 2019-07-04 NOTE — Telephone Encounter (Signed)
Electronic refill Hydroxyzine Last refill 03/29/19 #30/3 Last office visit 03/16/19

## 2019-07-05 NOTE — Telephone Encounter (Signed)
Spoke with pt relaying Dr. Synthia Innocent message.  Pt states she only take Lorazepam 1-2 tabs PRN.  I tried explaining Dr. Synthia Innocent message again but pt does not seem to understand and repeated her statement above.  Says not to worry about the refill and she will talk with Dr. Darnell Level at her upcoming [place].  FYI to Dr. Darnell Level.

## 2019-07-05 NOTE — Telephone Encounter (Signed)
Patient left a voicemail stating that she is out of her Lorazepam. Patient stated that she does not know why this has not been refilled. Patient stated that she has been taking this for months. Last office visit 03/16/19 Medication is no longer on her medication list.

## 2019-07-05 NOTE — Telephone Encounter (Signed)
Please touch base with patient.  Lorazepam was last filled 09/2018, at that time we transitioned to hydroxyzine PRN sleep and anxiety. This should be new medicine she's taking.

## 2019-07-14 ENCOUNTER — Telehealth: Payer: Self-pay

## 2019-07-14 NOTE — Telephone Encounter (Signed)
[name] - Client Nonclinical Telephone Record AccessNurse Client [facility] [facility] Night - Client Client Site [place] Physician Ria Bush - MD Contact Type Call Who Is Calling Patient / Member / Family / Caregiver Caller Name [place] Phone Number Patient Name Karen [name] Patient DOB [birth year] Call Type Message Only Information Provided Reason for Call Request for General Office Information Initial Comment Caller states she just got a reminder for an appt, but she does not recall having an appt scheduled. Caller states she does not know of any appt and if she does have one, then she needs to cancel this. Disp. Time Disposition Final User 07/13/2019 8:53:17 PM General Information Provided Yes Britt Boozer Call Closed By: Britt Boozer Transaction Date/Time: 07/13/2019 8:43:52 PM (ET)

## 2019-07-18 ENCOUNTER — Ambulatory Visit: Payer: Medicare Other | Admitting: Family Medicine

## 2019-08-17 DIAGNOSIS — M2550 Pain in unspecified joint: Secondary | ICD-10-CM | POA: Diagnosis not present

## 2019-09-01 ENCOUNTER — Telehealth: Payer: Self-pay | Admitting: Family Medicine

## 2019-09-01 NOTE — Telephone Encounter (Signed)
Don't think this is rash to paroxetine given how long she's tolerated this ok.  Do agree with OV for further evaluation - plz place with next available provider.

## 2019-09-01 NOTE — Telephone Encounter (Signed)
Lvm asking pt to call back.  Needs to schedule OV.  FYI to Dr. Darnell Level.

## 2019-09-01 NOTE — Telephone Encounter (Signed)
Pt called and is having a reaction to the Paroxetine x 1 day.  Pt has been taking this for several years, not sure why all of a sudden she is having a reaction.  Rash, itching at neck, arms and patches all over body. Pt does have visible hives.  Denies any SOB or breathing issues - feels fine other than itching all over.  Pt has not been putting anything on the rash.   Pt has not started any other medications recently.  No new soaps, detergent, no change diet.   Please advise, thanks.

## 2019-09-02 MED ORDER — TRIAMCINOLONE ACETONIDE 0.1 % EX CREA: 1.0000 "application " | TOPICAL_CREAM | Freq: Two times a day (BID) | CUTANEOUS | 0 refills | Status: DC

## 2019-09-02 NOTE — Addendum Note (Signed)
Addended by: Ria Bush on: 09/02/2019 11:10 AM   Modules accepted: Orders

## 2019-09-02 NOTE — Telephone Encounter (Signed)
Noted! Thank you

## 2019-09-02 NOTE — Telephone Encounter (Signed)
Pt left v/m requesting prednisone for poison oak; area on neck to shoulder is 4-5" and there are other areas on body with rash.[facility]. No available appts today and Dr Darnell Level sent in triamcinolone cream to [facility] to apply to affected areas and pt can also take hydroxyzine for itching. If pt needs to be seen can schedule next wk at [facility] or if cannot wait to be seen should go to [facility] for eval. Pt notified as instructed and voiced understanding. Pt is appreciative and pt wanted Dr Darnell Level to know pt spoke with her granddaughter this morning and pt had helped granddaughter in her yard the other day and granddaughter is broken out with rash as well and said that she found poison oak around [place] where they were working in the yard so pt is sure it is poison oak and not allergic reaction to a med.FYI to Dr Darnell Level.

## 2019-09-05 ENCOUNTER — Other Ambulatory Visit: Payer: Self-pay

## 2019-09-05 ENCOUNTER — Ambulatory Visit: Payer: Medicare PPO | Admitting: Internal Medicine

## 2019-09-05 ENCOUNTER — Encounter: Payer: Self-pay | Admitting: Internal Medicine

## 2019-09-05 VITALS — BP 132/76 | HR 77 | Temp 97.2°F | Wt 166.0 lb

## 2019-09-05 DIAGNOSIS — L255 Unspecified contact dermatitis due to plants, except food: Secondary | ICD-10-CM | POA: Diagnosis not present

## 2019-09-05 MED ORDER — METHYLPREDNISOLONE ACETATE 80 MG/ML IJ SUSP
80.0000 mg | Freq: Once | INTRAMUSCULAR | Status: AC
  Administered 2019-09-05: 80 mg via INTRAMUSCULAR

## 2019-09-05 MED ORDER — PREDNISONE 10 MG PO TABS: ORAL_TABLET | ORAL | 0 refills | Status: DC

## 2019-09-05 NOTE — Patient Instructions (Signed)
Contact Dermatitis °Dermatitis is redness, soreness, and swelling (inflammation) of the skin. Contact dermatitis is a reaction to something that touches the skin. °There are two types of contact dermatitis: °· Irritant contact dermatitis. This happens when something bothers (irritates) your skin, like soap. °· Allergic contact dermatitis. This is caused when you are exposed to something that you are allergic to, such as poison ivy. °What are the causes? °· Common causes of irritant contact dermatitis include: °? Makeup. °? Soaps. °? Detergents. °? Bleaches. °? Acids. °? Metals, such as nickel. °· Common causes of allergic contact dermatitis include: °? Plants. °? Chemicals. °? Jewelry. °? Latex. °? Medicines. °? Preservatives in products, such as clothing. °What increases the risk? °· Having a job that exposes you to things that bother your skin. °· Having asthma or eczema. °What are the signs or symptoms? °Symptoms may happen anywhere the irritant has touched your skin. Symptoms include: °· Dry or flaky skin. °· Redness. °· Cracks. °· Itching. °· Pain or a burning feeling. °· Blisters. °· Blood or clear fluid draining from skin cracks. °With allergic contact dermatitis, swelling may occur. This may happen in places such as the eyelids, mouth, or genitals. °How is this treated? °· This condition is treated by checking for the cause of the reaction and protecting your skin. Treatment may also include: °? Steroid creams, ointments, or medicines. °? Antibiotic medicines or other ointments, if you have a skin infection. °? Lotion or medicines to help with itching. °? A bandage (dressing). °Follow these instructions at home: °Skin care °· Moisturize your skin as needed. °· Put cool cloths on your skin. °· Put a baking soda paste on your skin. Stir water into baking soda until it looks like a paste. °· Do not scratch your skin. °· Avoid having things rub up against your skin. °· Avoid the use of soaps, perfumes, and  dyes. °Medicines °· Take or apply over-the-counter and prescription medicines only as told by your doctor. °· If you were prescribed an antibiotic medicine, take or apply it as told by your doctor. Do not stop using it even if your condition starts to get better. °Bathing °· Take a bath with: °? Epsom salts. °? Baking soda. °? Colloidal oatmeal. °· Bathe less often. °· Bathe in warm water. Avoid using hot water. °Bandage care °· If you were given a bandage, change it as told by your health care provider. °· Wash your hands with soap and water before and after you change your bandage. If soap and water are not available, use hand sanitizer. °General instructions °· Avoid the things that caused your reaction. If you do not know what caused it, keep a journal. Write down: °? What you eat. °? What skin products you use. °? What you drink. °? What you wear in the area that has symptoms. This includes jewelry. °· Check the affected areas every day for signs of infection. Check for: °? More redness, swelling, or pain. °? More fluid or blood. °? Warmth. °? Pus or a bad smell. °· Keep all follow-up visits as told by your doctor. This is important. °Contact a doctor if: °· You do not get better with treatment. °· Your condition gets worse. °· You have signs of infection, such as: °? More swelling. °? Tenderness. °? More redness. °? Soreness. °? Warmth. °· You have a fever. °· You have new symptoms. °Get help right away if: °· You have a very bad headache. °· You have neck pain. °·   Your neck is stiff. °· You throw up (vomit). °· You feel very sleepy. °· You see red streaks coming from the area. °· Your bone or joint near the area hurts after the skin has healed. °· The area turns darker. °· You have trouble breathing. °Summary °· Dermatitis is redness, soreness, and swelling of the skin. °· Symptoms may occur where the irritant has touched you. °· Treatment may include medicines and skin care. °· If you do not know what caused  your reaction, keep a journal. °· Contact a doctor if your condition gets worse or you have signs of infection. °This information is not intended to replace advice given to you by your health care provider. Make sure you discuss any questions you have with your health care provider. °Document Revised: 07/07/2018 Document Reviewed: 09/30/2017 °Elsevier Patient Education © 2020 Elsevier Inc. ° °
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
## 2019-09-05 NOTE — Addendum Note (Signed)
Addended by: Lurlean Nanny on: 09/05/2019 03:55 PM   Modules accepted: Orders

## 2019-10-10 ENCOUNTER — Other Ambulatory Visit: Payer: Self-pay | Admitting: Family Medicine

## 2019-12-31 ENCOUNTER — Other Ambulatory Visit: Payer: Self-pay | Admitting: Family Medicine

## 2020-01-02 NOTE — Telephone Encounter (Signed)
[place] Night - Client TELEPHONE ADVICE RECORD AccessNurse Patient Name: [facility] Gender: Female DOB: [birth year] Age: 74 Y 9 M 23 D Return Phone Number: [phone] (Primary), [phone] (Secondary) Address: City/State/Zip: [place] Client [place] Night - Client Client Site [place] Physician Ria Bush - MD Contact Type Call Who Is Calling Patient / Member / Family / Caregiver Call Type Triage / Clinical Relationship To Patient Self Return Phone Number [phone] (Secondary) Chief Complaint Medication reaction Reason for Call Symptomatic / Request for [place] states she ran out of Hydroxyzine . Her hands are shaking and she want to know if it is because she has not taken the medication Translation No Nurse Assessment Nurse: Allean Found, RN, Estill Bamberg Date/Time (Eastern Time): 01/01/2020 6:34:12 PM Confirm and document reason for call. If symptomatic, describe symptoms. ---Caller states she ran out of hydroxyzine - her hands are shaking and wants to know if it could be related to running out of her meds? Does the patient have any new or worsening symptoms? ---Yes Will a triage be completed? ---Yes Related visit to physician within the last 2 weeks? ---No Does the PT have any chronic conditions? (i.e. diabetes, asthma, this includes High risk factors for pregnancy, etc.) ---Yes List chronic conditions. ---anxiety Is this a behavioral health or substance abuse call? ---No Guidelines Guideline Title Affirmed Question Affirmed Notes Nurse Date/Time (Eastern Time) [place] - Tics - Shudders Shuddering (trembling) occurs without an obvious cause [place], RN, Estill Bamberg 01/01/2020 6:37:14 PM Disp. Time [name] Time) Disposition Final User 01/01/2020 6:42:17 PM SEE PCP WITHIN 3 DAYS Yes [place], RN, Therapist, music Disagree/Comply Comply Caller Understands Yes PLEASE  NOTE: All timestamps contained within this report are represented as [place] Standard Time. CONFIDENTIALTY NOTICE: This fax transmission is intended only for the addressee. It contains information that is legally privileged, confidential or otherwise protected from use or disclosure. If you are not the intended recipient, you are strictly prohibited from reviewing, disclosing, copying using or disseminating any of this information or taking any action in reliance on or regarding this information. If you have received this fax in error, please notify us immediately by telephone so that we can arrange for its return to [place]. Phone: [phone], Toll-Free: [phone], Fax: [phone] Page: 2 of 2 Call Id: [id number] PreDisposition Call Doctor Care Advice Given Per Guideline SEE PCP WITHIN 3 DAYS: * You need to be seen within 2 or 3 days. * PCP VISIT: Call your doctor (or NP/PA) during regular office hours and make an appointment. A clinic or urgent care center are good places to go for care if your doctor's office is closed or you can't get an appointment. NOTE: If office will be open tomorrow, tell caller to call then, not in 3 days. CALL BACK IF: * You develop other worrisome symptoms * You become worse CARE ADVICE given per [place] - Tics - Shudders (Adult) guideline. Referrals REFERRED TO PCP OFFICE

## 2020-01-03 NOTE — Telephone Encounter (Signed)
[place] Night - Client TELEPHONE ADVICE RECORD Access[facility]urse Patient Name: Karen [name] [facility] [place] Gender: Female DOB: 1946-03-04 Age: 74 Y 9 M 24 D Return Phone Number: [phone] (Primary) Address: City/State/Zip: [place] [address] Client [facility] Night - Client Client Site [place] Physician Ria Bush - MD Contact Type Call Who Is Calling Patient / Member / Family / Caregiver Call Type Triage / Clinical Relationship To Patient Self Return Phone Number [phone] (Primary) Chief Complaint Prescription Refill or Medication Request (non symptomatic) Reason for Call Symptomatic / Request for [place] states she is not able to get her Hydroxyzine refilled and has not been able to . [facility]. Caller states she is completely out. Translation No Nurse Assessment Nurse: Glean Salvo, RN, Magda Paganini Date/Time [name] Time): 01/02/2020 7:19:28 PM Confirm and document reason for call. If symptomatic, describe symptoms. ---Out of hydroxyzine and cannot get a refill. pt is completely out. denies anxiety sx at this time. Does the patient have any new or worsening symptoms? ---No Nurse: Glean Salvo, RN, Magda Paganini Date/Time (Eastern Time): 01/02/2020 7:21:38 PM Please select the assessment type ---Refill Additional Documentation ---Last dose yesterday Does the patient have enough medication to last until the office opens? ---No Disp. Time [name] Time) Disposition Final User 01/02/2020 7:29:22 PM Pharmacy Call Glean Salvo, RN, Magda Paganini Reason: [facility] per protocol 01/02/2020 7:30:12 PM Clinical Call Yes Glean Salvo, RN, Magda Paganini Comments User: Beulah Gandy, RN Date/Time [name] Time): 01/02/2020 7:28:43 PM Advised to call pharmacy for loaner dose and to call back if unable to acquire User: Beulah Gandy, RN Date/Time [name] Time): 01/02/2020 7:30:09 PM PLEASE  NOTE: All timestamps contained within this report are represented as [place] Standard Time. CONFIDENTIALTY NOTICE: This fax transmission is intended only for the addressee. It contains information that is legally privileged, confidential or otherwise protected from use or disclosure. If you are not the intended recipient, you are strictly prohibited from reviewing, disclosing, copying using or disseminating any of this information or taking any action in reliance on or regarding this information. If you have received this fax in error, please notify us immediately by telephone so that we can arrange for its return to [place]. Phone: [phone], Toll-Free: [phone], Fax: [phone] Page: 2 of 2 Call Id: [id number] Comments denies sx at this time.

## 2020-01-18 DIAGNOSIS — M2550 Pain in unspecified joint: Secondary | ICD-10-CM | POA: Diagnosis not present

## 2020-02-01 ENCOUNTER — Telehealth: Payer: Self-pay

## 2020-02-01 NOTE — Telephone Encounter (Signed)
Pt left v/m that pt has no more hemorrhoid suppositories but pt still has hemorrhoids. Pt wants to know what Dr Darnell Level recommends to do next. See 06/22/19 phone note; I tried to call pt back on both her contact #s and no answer and no v/m.  Sending note to Dr Jiles Prows CMA.

## 2020-02-02 MED ORDER — HYDROCORTISONE ACETATE 25 MG RE SUPP: RECTAL | 0 refills | Status: DC

## 2020-02-02 NOTE — Addendum Note (Signed)
Addended by: Ria Bush on: 02/02/2020 10:37 AM   Modules accepted: Orders

## 2020-02-02 NOTE — Telephone Encounter (Addendum)
Spoke with pt relaying Dr. Synthia Innocent message.  Pt verbalizes understanding but declines OV at this time but expresses her thanks for the refill.  FYI to Dr. Darnell Level.  Also, pt scheduled: wellness- 03/28/20 at 2:00 lab visit- 03/28/20 at 8:10  Prt 2- 04/02/20 at 2:30

## 2020-02-02 NOTE — Telephone Encounter (Signed)
I've refilled hemorrhoid suppositories.  Pt overdue for office visit. Would offer evaluation in office for this.  She will also be due for CPE/AMW after 03/15/2020 - please schedule. AMW with health advisor, CPE with me.

## 2020-02-07 ENCOUNTER — Ambulatory Visit (INDEPENDENT_AMBULATORY_CARE_PROVIDER_SITE_OTHER)
Admission: RE | Admit: 2020-02-07 | Discharge: 2020-02-07 | Disposition: A | Discharge status: Home / Self Care | Payer: Medicare PPO | Source: Ambulatory Visit | Attending: Family Medicine | Admitting: Family Medicine

## 2020-02-07 ENCOUNTER — Telehealth: Payer: Self-pay

## 2020-02-07 ENCOUNTER — Encounter: Payer: Self-pay | Admitting: Family Medicine

## 2020-02-07 ENCOUNTER — Ambulatory Visit: Payer: Medicare PPO | Admitting: Orthopaedic Surgery

## 2020-02-07 ENCOUNTER — Encounter: Payer: Self-pay | Admitting: Orthopaedic Surgery

## 2020-02-07 ENCOUNTER — Other Ambulatory Visit: Payer: Self-pay

## 2020-02-07 ENCOUNTER — Ambulatory Visit: Payer: Medicare PPO | Admitting: Family Medicine

## 2020-02-07 VITALS — BP 130/78 | HR 91 | Temp 98.6°F | Ht 62.5 in | Wt 167.5 lb

## 2020-02-07 DIAGNOSIS — S42211A Unspecified displaced fracture of surgical neck of right humerus, initial encounter for closed fracture: Secondary | ICD-10-CM | POA: Diagnosis not present

## 2020-02-07 DIAGNOSIS — S42201A Unspecified fracture of upper end of right humerus, initial encounter for closed fracture: Secondary | ICD-10-CM | POA: Insufficient documentation

## 2020-02-07 DIAGNOSIS — S42214A Unspecified nondisplaced fracture of surgical neck of right humerus, initial encounter for closed fracture: Secondary | ICD-10-CM

## 2020-02-07 DIAGNOSIS — M25511 Pain in right shoulder: Secondary | ICD-10-CM

## 2020-02-07 DIAGNOSIS — W08XXXA Fall from other furniture, initial encounter: Secondary | ICD-10-CM | POA: Insufficient documentation

## 2020-02-07 DIAGNOSIS — S42291A Other displaced fracture of upper end of right humerus, initial encounter for closed fracture: Secondary | ICD-10-CM

## 2020-02-07 MED ORDER — HYDROCORTISONE ACETATE 25 MG RE SUPP: RECTAL | 0 refills | Status: DC
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
## 2020-02-07 NOTE — Telephone Encounter (Signed)
[facility] Night - Client Nonclinical Telephone Record [facility]urse Client [place] Night - Client Client Site [place] Physician Ria Bush - MD Contact Type Call Who Is Calling Patient / Member / Family / Caregiver Caller Name [place] Phone Number [phone] Patient Name Karen [name] Patient DOB 03/21/1946 Call Type Message Only Information Provided Reason for Call Request to Schedule Office Appointment Initial Comment Caller states she needs to see her doctor today. She fell off kitchen table. Her shoulder is hurting and swelling. Additional Comment Note She needs to be seen today because she is hurting per patient. Disp. Time Disposition Final User 02/07/2020 6:39:22 AM General Information Provided Yes Artis Flock Call Closed By: Artis Flock Transaction Date/Time: 02/07/2020 6:36:54 AM (ET)

## 2020-02-07 NOTE — Patient Instructions (Signed)
Keep arm in sling.  Can use tylenol for pain.  We are placing a referral to Ortho for you.

## 2020-02-07 NOTE — Assessment & Plan Note (Signed)
Accidental fall.  No head or neck injury noted.

## 2020-02-07 NOTE — Telephone Encounter (Signed)
Pt has already been seen today by Dr Diona Browner and ortho.

## 2020-02-07 NOTE — Assessment & Plan Note (Signed)
Placed arm in sling for stability.  Tyelnol for pain.  Stat referral to Ortho.

## 2020-02-07 NOTE — Telephone Encounter (Signed)
I spoke with pt on 02/06/20 was chasing parakeet and pt got up on dining room table and fell to hardwood floor; rt shoulder pain and swelling. Pt scheduled appt with Dr Diona Browner today at 9:20 AM.

## 2020-02-08 ENCOUNTER — Encounter (HOSPITAL_COMMUNITY): Payer: Self-pay | Admitting: Orthopaedic Surgery

## 2020-02-08 ENCOUNTER — Other Ambulatory Visit (HOSPITAL_COMMUNITY)
Admission: RE | Admit: 2020-02-08 | Discharge: 2020-02-08 | Disposition: A | Discharge status: Home / Self Care | Payer: Medicare PPO | Source: Ambulatory Visit | Attending: Orthopaedic Surgery | Admitting: Orthopaedic Surgery

## 2020-02-08 ENCOUNTER — Other Ambulatory Visit: Payer: Self-pay

## 2020-02-08 DIAGNOSIS — Z20822 Contact with and (suspected) exposure to covid-19: Secondary | ICD-10-CM | POA: Insufficient documentation

## 2020-02-08 DIAGNOSIS — Z01818 Encounter for other preprocedural examination: Secondary | ICD-10-CM | POA: Diagnosis not present

## 2020-02-08 LAB — SARS CORONAVIRUS 2 (TAT 6-24 HRS): SARS Coronavirus 2: NEGATIVE

## 2020-02-08 NOTE — Progress Notes (Signed)
Patient denies shortness of breath, fever, cough or chest pain.  PCP - Dr Eliezer Lofts Cardiologist - n/a  Chest x-ray - n/a EKG - 03/17/19 Stress Test - n/a ECHO - 12/24/18 Cardiac Cath - n/a  ERAS: Clears til 11:30 am  STOP now taking any Aspirin (unless otherwise instructed by your surgeon), Aleve, Naproxen, Ibuprofen, Motrin, Advil, Goody's, BC's, all herbal medications, fish oil, and all vitamins.   Coronavirus Screening Covid test on 02/08/20 was negative.  Patient verbalized understanding of instructions that were given via phone.

## 2020-02-09 ENCOUNTER — Ambulatory Visit (HOSPITAL_COMMUNITY): Payer: Medicare PPO

## 2020-02-09 ENCOUNTER — Ambulatory Visit (HOSPITAL_COMMUNITY)
Admission: RE | Admit: 2020-02-09 | Discharge: 2020-02-09 | Disposition: A | Discharge status: Home / Self Care | Payer: Medicare PPO | Attending: Orthopaedic Surgery | Admitting: Orthopaedic Surgery

## 2020-02-09 ENCOUNTER — Encounter (HOSPITAL_COMMUNITY): Payer: Self-pay | Admitting: Orthopaedic Surgery

## 2020-02-09 ENCOUNTER — Ambulatory Visit (HOSPITAL_COMMUNITY): Payer: Medicare PPO | Admitting: Anesthesiology

## 2020-02-09 ENCOUNTER — Encounter (HOSPITAL_COMMUNITY)
Admission: RE | Disposition: A | Discharge status: Home / Self Care | Payer: Self-pay | Source: Home / Self Care | Attending: Orthopaedic Surgery

## 2020-02-09 DIAGNOSIS — S42201A Unspecified fracture of upper end of right humerus, initial encounter for closed fracture: Secondary | ICD-10-CM | POA: Diagnosis not present

## 2020-02-09 DIAGNOSIS — E785 Hyperlipidemia, unspecified: Secondary | ICD-10-CM | POA: Diagnosis not present

## 2020-02-09 DIAGNOSIS — S42301D Unspecified fracture of shaft of humerus, right arm, subsequent encounter for fracture with routine healing: Secondary | ICD-10-CM | POA: Diagnosis not present

## 2020-02-09 DIAGNOSIS — S42211A Unspecified displaced fracture of surgical neck of right humerus, initial encounter for closed fracture: Secondary | ICD-10-CM | POA: Diagnosis not present

## 2020-02-09 DIAGNOSIS — S42291A Other displaced fracture of upper end of right humerus, initial encounter for closed fracture: Secondary | ICD-10-CM

## 2020-02-09 DIAGNOSIS — Z882 Allergy status to sulfonamides status: Secondary | ICD-10-CM | POA: Diagnosis not present

## 2020-02-09 DIAGNOSIS — Z881 Allergy status to other antibiotic agents status: Secondary | ICD-10-CM | POA: Diagnosis not present

## 2020-02-09 DIAGNOSIS — I471 Supraventricular tachycardia: Secondary | ICD-10-CM | POA: Diagnosis not present

## 2020-02-09 DIAGNOSIS — X58XXXA Exposure to other specified factors, initial encounter: Secondary | ICD-10-CM | POA: Diagnosis not present

## 2020-02-09 DIAGNOSIS — G8918 Other acute postprocedural pain: Secondary | ICD-10-CM | POA: Diagnosis not present

## 2020-02-09 DIAGNOSIS — Z419 Encounter for procedure for purposes other than remedying health state, unspecified: Secondary | ICD-10-CM

## 2020-02-09 HISTORY — DX: Hypothyroidism, unspecified: E03.9

## 2020-02-09 HISTORY — DX: Anxiety disorder, unspecified: F41.9

## 2020-02-09 HISTORY — PX: ORIF HUMERUS FRACTURE: SHX2126

## 2020-02-09 LAB — CBC
HCT: 41.1 % (ref 36.0–46.0)
Hemoglobin: 13.2 g/dL (ref 12.0–15.0)
MCH: 29.4 pg (ref 26.0–34.0)
MCHC: 32.1 g/dL (ref 30.0–36.0)
MCV: 91.5 fL (ref 80.0–100.0)
Platelets: 173 10*3/uL (ref 150–400)
RBC: 4.49 MIL/uL (ref 3.87–5.11)
RDW: 13.2 % (ref 11.5–15.5)
WBC: 11.2 10*3/uL — ABNORMAL HIGH (ref 4.0–10.5)
nRBC: 0 % (ref 0.0–0.2)

## 2020-02-09 SURGERY — OPEN REDUCTION INTERNAL FIXATION (ORIF) PROXIMAL HUMERUS FRACTURE
Anesthesia: General | Laterality: Right

## 2020-02-09 MED ORDER — CALCIUM CARBONATE-VITAMIN D 500-200 MG-UNIT PO TABS: 1.0000 | ORAL_TABLET | Freq: Three times a day (TID) | ORAL | 6 refills | Status: AC

## 2020-02-09 MED ORDER — METHOCARBAMOL 500 MG PO TABS: 500.0000 mg | ORAL_TABLET | Freq: Four times a day (QID) | ORAL | 2 refills | Status: DC | PRN

## 2020-02-09 MED ORDER — TRANEXAMIC ACID 1000 MG/10ML IV SOLN
2000.0000 mg | INTRAVENOUS | Status: DC
  Filled 2020-02-09: qty 20

## 2020-02-09 MED ORDER — ORAL CARE MOUTH RINSE: 15.0000 mL | Freq: Once | OROMUCOSAL | Status: AC

## 2020-02-09 MED ORDER — PROPOFOL 10 MG/ML IV BOLUS
INTRAVENOUS | Status: DC | PRN
  Administered 2020-02-09: 200 mg via INTRAVENOUS

## 2020-02-09 MED ORDER — AMISULPRIDE (ANTIEMETIC) 5 MG/2ML IV SOLN: 10.0000 mg | Freq: Once | INTRAVENOUS | Status: DC | PRN

## 2020-02-09 MED ORDER — ONDANSETRON HCL 4 MG/2ML IJ SOLN: 4.0000 mg | Freq: Once | INTRAMUSCULAR | Status: DC | PRN

## 2020-02-09 MED ORDER — LACTATED RINGERS IV SOLN: INTRAVENOUS | Status: DC

## 2020-02-09 MED ORDER — VANCOMYCIN HCL 1000 MG IV SOLR
INTRAVENOUS | Status: DC | PRN
  Administered 2020-02-09: 1000 mg

## 2020-02-09 MED ORDER — SUGAMMADEX SODIUM 200 MG/2ML IV SOLN
INTRAVENOUS | Status: DC | PRN
  Administered 2020-02-09: 150 mg via INTRAVENOUS

## 2020-02-09 MED ORDER — ROCURONIUM BROMIDE 10 MG/ML (PF) SYRINGE
PREFILLED_SYRINGE | INTRAVENOUS | Status: DC | PRN
  Administered 2020-02-09: 50 mg via INTRAVENOUS

## 2020-02-09 MED ORDER — ONDANSETRON HCL 4 MG/2ML IJ SOLN
INTRAMUSCULAR | Status: DC | PRN
  Administered 2020-02-09: 4 mg via INTRAVENOUS

## 2020-02-09 MED ORDER — FENTANYL CITRATE (PF) 250 MCG/5ML IJ SOLN
INTRAMUSCULAR | Status: AC
  Filled 2020-02-09: qty 5

## 2020-02-09 MED ORDER — BUPIVACAINE HCL (PF) 0.5 % IJ SOLN
INTRAMUSCULAR | Status: DC | PRN
  Administered 2020-02-09: 15 mL via PERINEURAL

## 2020-02-09 MED ORDER — OXYCODONE HCL 5 MG/5ML PO SOLN: 5.0000 mg | Freq: Once | ORAL | Status: DC | PRN

## 2020-02-09 MED ORDER — EPHEDRINE SULFATE-NACL 50-0.9 MG/10ML-% IV SOSY
PREFILLED_SYRINGE | INTRAVENOUS | Status: DC | PRN
  Administered 2020-02-09 (×2): 5 mg via INTRAVENOUS

## 2020-02-09 MED ORDER — OXYCODONE HCL 5 MG PO TABS: 5.0000 mg | ORAL_TABLET | Freq: Once | ORAL | Status: DC | PRN

## 2020-02-09 MED ORDER — MIDAZOLAM HCL 2 MG/ML PO SYRP: 2.0000 mg | ORAL_SOLUTION | Freq: Once | ORAL | Status: DC

## 2020-02-09 MED ORDER — CHLORHEXIDINE GLUCONATE 0.12 % MT SOLN: 15.0000 mL | Freq: Once | OROMUCOSAL | Status: AC

## 2020-02-09 MED ORDER — CEFAZOLIN SODIUM-DEXTROSE 2-4 GM/100ML-% IV SOLN
2.0000 g | INTRAVENOUS | Status: AC
  Administered 2020-02-09: 2 g via INTRAVENOUS
  Filled 2020-02-09: qty 100

## 2020-02-09 MED ORDER — PROPOFOL 10 MG/ML IV BOLUS
INTRAVENOUS | Status: AC
  Filled 2020-02-09: qty 20

## 2020-02-09 MED ORDER — DEXAMETHASONE SODIUM PHOSPHATE 10 MG/ML IJ SOLN
INTRAMUSCULAR | Status: DC | PRN
  Administered 2020-02-09: 10 mg via INTRAVENOUS

## 2020-02-09 MED ORDER — TRANEXAMIC ACID-NACL 1000-0.7 MG/100ML-% IV SOLN
1000.0000 mg | INTRAVENOUS | Status: AC
  Administered 2020-02-09: 1000 mg via INTRAVENOUS
  Filled 2020-02-09: qty 100

## 2020-02-09 MED ORDER — FENTANYL CITRATE (PF) 100 MCG/2ML IJ SOLN
INTRAMUSCULAR | Status: AC
  Administered 2020-02-09: 50 ug via INTRAVENOUS
  Filled 2020-02-09: qty 2

## 2020-02-09 MED ORDER — CHLORHEXIDINE GLUCONATE 0.12 % MT SOLN
OROMUCOSAL | Status: AC
  Administered 2020-02-09: 15 mL via OROMUCOSAL
  Filled 2020-02-09: qty 15

## 2020-02-09 MED ORDER — BUPIVACAINE LIPOSOME 1.3 % IJ SUSP
INTRAMUSCULAR | Status: DC | PRN
  Administered 2020-02-09: 10 mL

## 2020-02-09 MED ORDER — EPHEDRINE 5 MG/ML INJ
INTRAVENOUS | Status: AC
  Filled 2020-02-09: qty 10

## 2020-02-09 MED ORDER — DEXAMETHASONE SODIUM PHOSPHATE 10 MG/ML IJ SOLN
INTRAMUSCULAR | Status: AC
  Filled 2020-02-09: qty 1

## 2020-02-09 MED ORDER — OXYCODONE-ACETAMINOPHEN 5-325 MG PO TABS
1.0000 | ORAL_TABLET | Freq: Three times a day (TID) | ORAL | 0 refills | Status: DC | PRN
Start: 2020-02-09 — End: 2020-04-02

## 2020-02-09 MED ORDER — FENTANYL CITRATE (PF) 100 MCG/2ML IJ SOLN: 100.0000 ug | Freq: Once | INTRAMUSCULAR | Status: AC

## 2020-02-09 MED ORDER — ONDANSETRON HCL 4 MG/2ML IJ SOLN
INTRAMUSCULAR | Status: AC
  Filled 2020-02-09: qty 2

## 2020-02-09 MED ORDER — LIDOCAINE 2% (20 MG/ML) 5 ML SYRINGE
INTRAMUSCULAR | Status: DC | PRN
  Administered 2020-02-09: 60 mg via INTRAVENOUS

## 2020-02-09 MED ORDER — MIDAZOLAM HCL 2 MG/2ML IJ SOLN
INTRAMUSCULAR | Status: AC
  Administered 2020-02-09: 2 mg
  Filled 2020-02-09: qty 2

## 2020-02-09 MED ORDER — ZINC SULFATE 220 (50 ZN) MG PO CAPS: 220.0000 mg | ORAL_CAPSULE | Freq: Every day | ORAL | 0 refills | Status: DC

## 2020-02-09 MED ORDER — FENTANYL CITRATE (PF) 100 MCG/2ML IJ SOLN: 25.0000 ug | INTRAMUSCULAR | Status: DC | PRN

## 2020-02-09 MED ORDER — SODIUM CHLORIDE 0.9 % IR SOLN
Status: DC | PRN
  Administered 2020-02-09: 1000 mL

## 2020-02-09 MED ORDER — PHENYLEPHRINE HCL-NACL 10-0.9 MG/250ML-% IV SOLN
INTRAVENOUS | Status: DC | PRN
  Administered 2020-02-09: 20 ug/min via INTRAVENOUS

## 2020-02-09 MED ORDER — VANCOMYCIN HCL 1000 MG IV SOLR
INTRAVENOUS | Status: AC
  Filled 2020-02-09: qty 1000

## 2020-02-09 SURGICAL SUPPLY — 50 items
BIT DRILL 3.2 (BIT) ×2
BIT DRILL 3.2XCALB NS DISP (BIT) ×1 IMPLANT
BIT DRILL CALIBRATED 2.7 (BIT) ×2 IMPLANT
BIT DRL 3.2XCALB NS DISP (BIT) ×1
BNDG COHESIVE 3X5 TAN STRL LF (GAUZE/BANDAGES/DRESSINGS) ×2 IMPLANT
COVER SURGICAL LIGHT HANDLE (MISCELLANEOUS) ×2 IMPLANT
DRAPE C-ARM 42X72 X-RAY (DRAPES) ×2 IMPLANT
DRAPE IMP U-DRAPE 54X76 (DRAPES) ×2 IMPLANT
DRAPE INCISE IOBAN 66X45 STRL (DRAPES) ×2 IMPLANT
DRAPE U-SHAPE 47X51 STRL (DRAPES) ×2 IMPLANT
DRSG TEGADERM 4X10 (GAUZE/BANDAGES/DRESSINGS) ×2 IMPLANT
ELECT CAUTERY BLADE 6.4 (BLADE) ×2 IMPLANT
ELECT REM PT RETURN 9FT ADLT (ELECTROSURGICAL) ×2
ELECTRODE REM PT RTRN 9FT ADLT (ELECTROSURGICAL) ×1 IMPLANT
GAUZE XEROFORM 5X9 LF (GAUZE/BANDAGES/DRESSINGS) ×2 IMPLANT
GLOVE BIOGEL PI IND STRL 7.0 (GLOVE) ×1 IMPLANT
GLOVE BIOGEL PI INDICATOR 7.0 (GLOVE) ×1
GLOVE ECLIPSE 7.0 STRL STRAW (GLOVE) ×2 IMPLANT
GLOVE SKINSENSE NS SZ7.5 (GLOVE) ×2
GLOVE SKINSENSE STRL SZ7.5 (GLOVE) ×2 IMPLANT
GOWN STRL REIN XL XLG (GOWN DISPOSABLE) ×6 IMPLANT
K-WIRE 2X5 SS THRDED S3 (WIRE) ×2
KIT BASIN OR (CUSTOM PROCEDURE TRAY) ×2 IMPLANT
KIT TURNOVER KIT B (KITS) ×2 IMPLANT
KWIRE 2X5 SS THRDED S3 (WIRE) ×1 IMPLANT
MANIFOLD NEPTUNE II (INSTRUMENTS) ×2 IMPLANT
NS IRRIG 1000ML POUR BTL (IV SOLUTION) ×2 IMPLANT
PACK SHOULDER (CUSTOM PROCEDURE TRAY) ×2 IMPLANT
PACK UNIVERSAL I (CUSTOM PROCEDURE TRAY) ×2 IMPLANT
PEG LOCKING 3.2X32 (Peg) ×2 IMPLANT
PEG LOCKING 3.2X34 (Screw) ×2 IMPLANT
PEG LOCKING 3.2X38 (Screw) ×2 IMPLANT
PEG LOCKING 3.2X40 (Peg) ×2 IMPLANT
PEG LOCKING 3.2X42 (Screw) ×4 IMPLANT
PEG LOCKING 3.2X48 (Peg) ×2 IMPLANT
PEG LOCKING 3.2X50 (Screw) ×2 IMPLANT
PLATE PROX HUMERUS 4H LEFT LOW (Plate) ×2 IMPLANT
SCREW LOCK CORT STAR 3.5X26 (Screw) ×2 IMPLANT
SCREW LP NL T15 3.5X24 (Screw) ×2 IMPLANT
SCREW LP NL T15 3.5X26 (Screw) ×2 IMPLANT
SLEEVE MEASURING 3.2 (BIT) ×2 IMPLANT
SPONGE LAP 18X18 RF (DISPOSABLE) ×2 IMPLANT
SUT ETHILON 3 0 PS 1 (SUTURE) ×2 IMPLANT
SUT VIC AB 0 CT1 27 (SUTURE) ×2
SUT VIC AB 0 CT1 27XBRD ANBCTR (SUTURE) ×1 IMPLANT
SUT VIC AB 2-0 CT1 27 (SUTURE) ×2
SUT VIC AB 2-0 CT1 TAPERPNT 27 (SUTURE) ×1 IMPLANT
SYR BULB IRRIG 60ML STRL (SYRINGE) ×2 IMPLANT
TOWEL GREEN STERILE (TOWEL DISPOSABLE) ×2 IMPLANT
UNDERPAD 30X36 HEAVY ABSORB (UNDERPADS AND DIAPERS) ×2 IMPLANT
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
## 2020-02-09 NOTE — Op Note (Signed)
   Date of Surgery: 02/09/2020  INDICATIONS: Karen [name] is a 74 y.o.-year-old female with a displaced right proximal humerus fracture.  The patient did consent to the procedure after discussion of the risks and benefits.  PREOPERATIVE DIAGNOSIS: Displaced right surgical neck humerus fracture  POSTOPERATIVE DIAGNOSIS: Same.  PROCEDURE: Open reduction internal fixation of right surgical neck humerus fracture  SURGEON: N. Eduard Roux, M.D.  ASSIST: Ciro Backer [place], [place]; necessary for the timely completion of procedure and due to complexity of procedure.  ANESTHESIA:  general  IV FLUIDS AND URINE: See anesthesia.  ESTIMATED BLOOD LOSS: 200 mL.  IMPLANTS: Biomet proximal humerus plate  DRAINS: None  COMPLICATIONS: see description of procedure.  DESCRIPTION OF PROCEDURE: The patient was brought to the operating room.  The patient had been signed prior to the procedure and this was documented. The patient had the anesthesia placed by the anesthesiologist.  A time-out was performed to confirm that this was the correct patient, site, side and location. The patient did receive antibiotics prior to the incision and was re-dosed during the procedure as needed at indicated intervals.  The patient had the operative extremity prepped and draped in the standard surgical fashion.    Bony landmarks were marked on the shoulder region and anterolateral approach the proximal humerus was utilized.  Dissection was carried down through the subcutaneous tissue.  We continued our dissection down onto the deltoid.  The raphae was identified and split bluntly with a Cobb elevator to expose the proximal humerus.  Fracture hematoma was evacuated.  Retractors were placed for visualization.  Reduction of the fracture was obtained with traction and external rotation.  Fluoroscopy was used to confirm adequate reduction on orthogonal views.  A large Weber clamp was used to provisionally hold the reduction.  A proximal  humerus plate was placed at the appropriate position using fluoroscopic guidance.  K wire was placed through the proximal cluster into the central portion of the humeral head.  Two bicortical nonlocking screws were placed through the distal portion of the plate into the humeral shaft each with excellent fixation.  We then placed multiple locking pegs into the humeral head using fluoroscopic guidance.  I was very pleased with the fixation of the 2 calcar pegs.  One additional locking screw was placed through the distal portion of the plate into the humeral shaft.  Range of motion of the shoulder was performed gently and the entire segment moved as a unit.  Approach withdrawal technique was performed to confirm appropriate placement of the pegs.  Surgical wound was then thoroughly irrigated.  Half a gram of vancomycin powder was placed.  Layer closure performed.  Sterile dressings applied.  Shoulder sling placed.  Patient tolerated procedure well had no immediate complications.  POSTOPERATIVE PLAN: Nonweightbearing.  Sling at all times.  Discharge home.  Follow-up in 2 weeks for repeat x-rays.  Azucena Cecil, MD 3:47 PM

## 2020-02-09 NOTE — Anesthesia Procedure Notes (Signed)
Anesthesia Regional Block: Interscalene brachial plexus block   Pre-Anesthetic Checklist: ,, timeout performed, Correct Patient, Correct Site, Correct Laterality, Correct Procedure, Correct Position, site marked, Risks and benefits discussed,  Surgical consent,  Pre-op evaluation,  At surgeon's request and post-op pain management  Laterality: Right  Prep: chloraprep       Needles:  Injection technique: Single-shot  Needle Type: Echogenic Stimulator Needle     Needle Length: 10cm  Needle Gauge: 20     Additional Needles:   Procedures:,,,, ultrasound used (permanent image in chart),,,,  Narrative:  Start time: 02/09/2020 1:22 PM End time: 02/09/2020 1:27 PM Injection made incrementally with aspirations every 5 mL.  Performed by: Personally  Anesthesiologist: Lidia Collum, MD  Additional Notes: Standard monitors applied. Skin prepped. Good needle visualization with ultrasound. Injection made in 5cc increments with no resistance to injection. Patient tolerated the procedure well.

## 2020-02-09 NOTE — Discharge Instructions (Signed)

## 2020-02-09 NOTE — Anesthesia Preprocedure Evaluation (Addendum)
Anesthesia Evaluation  Patient identified by MRN, date of birth, ID band Patient awake    Reviewed: Allergy & Precautions, NPO status , Patient's Chart, lab work & pertinent test results  History of Anesthesia Complications Negative for: history of anesthetic complications  Airway Mallampati: II  TM Distance: >3 FB Neck ROM: Full    Dental   Pulmonary neg pulmonary ROS,    Pulmonary exam normal        Cardiovascular negative cardio ROS Normal cardiovascular exam     Neuro/Psych Anxiety Depression negative neurological ROS     GI/Hepatic negative GI ROS, Neg liver ROS,   Endo/Other  Hypothyroidism   Renal/GU Renal InsufficiencyRenal disease  negative genitourinary   Musculoskeletal negative musculoskeletal ROS (+)   Abdominal   Peds  Hematology negative hematology ROS (+)   Anesthesia Other Findings  Echo 11/2018 - EF 55-60%, mild LVH, LV impaired relaxation, no significant valvular disease  Reproductive/Obstetrics                           Anesthesia Physical Anesthesia Plan  ASA: II  Anesthesia Plan: General   Post-op Pain Management: GA combined w/ Regional for post-op pain   Induction: Intravenous  PONV Risk Score and Plan: 3 and Ondansetron, Dexamethasone, Treatment may vary due to age or medical condition and Midazolam  Airway Management Planned: Oral ETT  Additional Equipment: None  Intra-op Plan:   Post-operative Plan: Extubation in OR  Informed Consent: I have reviewed the patients History and Physical, chart, labs and discussed the procedure including the risks, benefits and alternatives for the proposed anesthesia with the patient or authorized representative who has indicated his/her understanding and acceptance.     Dental advisory given  Plan Discussed with:   Anesthesia Plan Comments:        Anesthesia Quick Evaluation

## 2020-02-09 NOTE — Anesthesia Procedure Notes (Signed)
Procedure Name: Intubation Date/Time: 02/09/2020 2:01 PM Performed by: Gwyndolyn Saxon, CRNA Pre-anesthesia Checklist: Patient identified, Emergency Drugs available, Suction available and Patient being monitored Patient Re-evaluated:Patient Re-evaluated prior to induction Oxygen Delivery Method: Circle system utilized Preoxygenation: Pre-oxygenation with 100% oxygen Induction Type: IV induction Ventilation: Mask ventilation without difficulty Laryngoscope Size: Miller and 2 Grade View: Grade I Tube type: Oral Tube size: 7.0 mm Number of attempts: 1 Airway Equipment and Method: Patient positioned with wedge pillow and Stylet Placement Confirmation: ETT inserted through vocal cords under direct vision,  positive ETCO2 and breath sounds checked- equal and bilateral Secured at: 21 cm Tube secured with: Tape Dental Injury: Teeth and Oropharynx as per pre-operative assessment

## 2020-02-10 ENCOUNTER — Encounter (HOSPITAL_COMMUNITY): Payer: Self-pay | Admitting: Orthopaedic Surgery

## 2020-02-10 NOTE — Transfer of Care (Signed)
Immediate Anesthesia Transfer of Care Note  Patient: Karen [name]  Procedure(s) Performed: OPEN REDUCTION INTERNAL FIXATION (ORIF) RIGHT PROXIMAL HUMERUS FRACTURE (Right )  Patient Location: PACU  Anesthesia Type:General and Regional  Level of Consciousness: drowsy and patient cooperative  Airway & Oxygen Therapy: Patient Spontanous Breathing and Patient connected to face mask oxygen  Post-op Assessment: Report given to RN and Post -op Vital signs reviewed and stable  Post vital signs: Reviewed and stable  Last Vitals:  Vitals Value Taken Time  BP 117/70 02/09/20 1648  Temp    Pulse 78 02/09/20 1656  Resp 25 02/09/20 1656  SpO2 93 % 02/09/20 1656  Vitals shown include unvalidated device data.  Last Pain:  Vitals:   02/09/20 1648  TempSrc:   PainSc: Asleep      Patients Stated Pain Goal: 4 (66/44/03 4742)  Complications: No complications documented.

## 2020-02-13 ENCOUNTER — Telehealth: Payer: Self-pay

## 2020-02-13 NOTE — Telephone Encounter (Signed)
[place] Night - Client Nonclinical Telephone Record AccessNurse Client [facility] Night - Client Client Site [place] Physician Ria Bush - MD Contact Type Call Who Is Calling Patient / Member / Family / Caregiver Caller Name [place] Phone Number [phone] Patient Name Karen [name] Patient DOB 2046/03/28 Call Type Message Only Information Provided Reason for Call Request for General Office Information Initial Comment Caller needed to know when she is due for her next physical. Please call. Disp. Time Disposition Final User 02/12/2020 4:03:47 PM General Information Provided Yes Kathlynn Grate Call Closed By: Kathlynn Grate Transaction Date/Time: 02/12/2020 4:00:20 PM (ET)

## 2020-02-13 NOTE — Telephone Encounter (Signed)
Pt phone remains busy and no answer on cell. Pt has fasting lab appt for CPX on 03/28/20 (Wed); on 03/28/20 at 2:00pm pt has AWV with nurse health advisor; and that would be a virtual or video visit. On 04/02/20 at 2:30 pm pt has AWV with Dr Darnell Level. Unable to reach pt by phone due to line remaining busy. Will send phone note to [place] at front desk.

## 2020-02-13 NOTE — Telephone Encounter (Signed)
Called and informed patient of upcoming appointments. Expressed understanding.

## 2020-02-13 NOTE — Anesthesia Postprocedure Evaluation (Signed)
Anesthesia Post Note  Patient: Lonetta Blassingame  Procedure(s) Performed: OPEN REDUCTION INTERNAL FIXATION (ORIF) RIGHT PROXIMAL HUMERUS FRACTURE (Right )     Patient location during evaluation: PACU Anesthesia Type: General and Regional Level of consciousness: awake and alert Pain management: pain level controlled Vital Signs Assessment: post-procedure vital signs reviewed and stable Respiratory status: spontaneous breathing, nonlabored ventilation, respiratory function stable and patient connected to nasal cannula oxygen Cardiovascular status: blood pressure returned to baseline and stable Postop Assessment: no apparent nausea or vomiting Anesthetic complications: no   No complications documented.  Last Vitals:  Vitals:   02/09/20 1634 02/09/20 1648  BP: 120/64 117/70  Pulse: 77 72  Resp: (!) 25 (!) 25  Temp:    SpO2: 93% 96%    Last Pain:  Vitals:   02/09/20 1648  TempSrc:   PainSc: Asleep                 Marveen Donlon

## 2020-02-17 ENCOUNTER — Other Ambulatory Visit: Payer: Self-pay | Admitting: Family Medicine

## 2020-02-21 ENCOUNTER — Ambulatory Visit (INDEPENDENT_AMBULATORY_CARE_PROVIDER_SITE_OTHER): Payer: Medicare PPO

## 2020-02-21 ENCOUNTER — Other Ambulatory Visit: Payer: Self-pay

## 2020-02-21 ENCOUNTER — Ambulatory Visit (INDEPENDENT_AMBULATORY_CARE_PROVIDER_SITE_OTHER): Payer: Medicare PPO | Admitting: Orthopaedic Surgery

## 2020-02-21 DIAGNOSIS — S42291A Other displaced fracture of upper end of right humerus, initial encounter for closed fracture: Secondary | ICD-10-CM | POA: Diagnosis not present
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
## 2020-02-22 ENCOUNTER — Telehealth: Payer: Self-pay | Admitting: Orthopaedic Surgery

## 2020-02-22 ENCOUNTER — Other Ambulatory Visit: Payer: Self-pay | Admitting: Family Medicine

## 2020-02-22 NOTE — Telephone Encounter (Signed)
Please advise 

## 2020-02-22 NOTE — Telephone Encounter (Signed)
Spoke with pt asking if she has resumed lorazepam.  Pt states she has not resumed this medication and will call [facility] to stop them from sending anymore refill requests.   Denied request.

## 2020-02-22 NOTE — Telephone Encounter (Addendum)
Pt left v/m that [facility] said that [facility] is calling refills for pt for lorazepam. I do not see where [facility] refilled or requested refill for lorazepam for pt; I spoke with Lennette Bihari pharmacist at [facility] and he said a refill request did go out from [place] on 02/17/20 but someone would have had to requested the refill because computer does not automatically generate refill request for controlled substances. Lennette Bihari has not clue who requested the lorazepam refill request on 02/17/20. See Lattie Haw CMA's comment in this phone note after talking with pt who called and left 2 v/m about this on triage phone after talking with [facility] CMA.

## 2020-02-22 NOTE — Telephone Encounter (Signed)
Pt would like to drive if she can drive with one arm? She would like a CB with an answer  [phone]

## 2020-02-22 NOTE — Telephone Encounter (Signed)
FYI  I called patient and advised. She wanted you to know that she is doing excellent, she is doing well with her exercises, and she appreciates you taking care of her.

## 2020-02-22 NOTE — Telephone Encounter (Signed)
Yes that is fine

## 2020-02-23 ENCOUNTER — Other Ambulatory Visit: Payer: Self-pay | Admitting: Orthopaedic Surgery

## 2020-02-27 NOTE — Telephone Encounter (Signed)
Please advise 

## 2020-02-29 ENCOUNTER — Telehealth: Payer: Self-pay | Admitting: *Deleted

## 2020-02-29 ENCOUNTER — Other Ambulatory Visit: Payer: Self-pay | Admitting: Family Medicine

## 2020-02-29 NOTE — Telephone Encounter (Signed)
Patient left a voicemail stating that she has 3 bottles of Levothyroxine and does not know why. Patient stated that she does know why the pharmacy keeps filling them. Patient stated that she does not know what to with all these pills. Tried to call patient back to discuss this with her and got her voicemail.  Left a message for her to call the office back.

## 2020-03-01 NOTE — Telephone Encounter (Signed)
Lvm returning pt's call.

## 2020-03-01 NOTE — Telephone Encounter (Signed)
[place] Night - Client Nonclinical Telephone Record AccessNurse Client [place] Night - Client Client Site [place] Physician Ria Bush - MD Contact Type Call Who Is Calling Patient / Member / Family / Caregiver Caller Name [place] Phone Number [phone] Patient Name Karen [name] Patient DOB July 14, 1945 Call Type Message Only Information Provided Reason for Call Request for General Office Information Initial Comment Caller states she is having a question about her thyroid medication. States multiple rxs has been filled for the same medication. Wants to know what they should be doing. Disp. Time Disposition Final User 02/29/2020 5:12:43 PM General Information Provided Yes Fanny Bien Call Closed By: Fanny Bien Transaction Date/Time: 02/29/2020 5:08:59 PM (ET)

## 2020-03-02 NOTE — Telephone Encounter (Signed)
Lvm returning pt's call.

## 2020-03-02 NOTE — Telephone Encounter (Signed)
Patient is returning call. Please advise? 

## 2020-03-05 NOTE — Telephone Encounter (Signed)
Lvm returning pt's call.

## 2020-03-06 ENCOUNTER — Ambulatory Visit: Payer: Medicare PPO | Admitting: Orthopaedic Surgery

## 2020-03-06 NOTE — Telephone Encounter (Signed)
Spoke with pt asking about her message and confirming she is taking med daily.  Pt says she takes levothyroxine every morning and is not sure how she ended up with so many bottles.  Says she will let the pharmacy know she doesn't need anymore for awhile.

## 2020-03-07 ENCOUNTER — Ambulatory Visit (INDEPENDENT_AMBULATORY_CARE_PROVIDER_SITE_OTHER): Payer: Medicare PPO

## 2020-03-07 ENCOUNTER — Encounter: Payer: Self-pay | Admitting: Orthopaedic Surgery

## 2020-03-07 ENCOUNTER — Other Ambulatory Visit: Payer: Self-pay

## 2020-03-07 ENCOUNTER — Ambulatory Visit (INDEPENDENT_AMBULATORY_CARE_PROVIDER_SITE_OTHER): Payer: Medicare PPO | Admitting: Orthopaedic Surgery

## 2020-03-07 DIAGNOSIS — S42291A Other displaced fracture of upper end of right humerus, initial encounter for closed fracture: Secondary | ICD-10-CM
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
## 2020-03-08 ENCOUNTER — Telehealth: Payer: Self-pay | Admitting: Orthopaedic Surgery

## 2020-03-08 NOTE — Telephone Encounter (Signed)
Patient called asked if she need to go to (PT) The number to contact patient is [phone]

## 2020-03-09 NOTE — Telephone Encounter (Signed)
I sent a referral to PT.  She should get a phone call.

## 2020-03-13 ENCOUNTER — Other Ambulatory Visit: Payer: Self-pay

## 2020-03-13 ENCOUNTER — Encounter: Payer: Self-pay | Admitting: Physical Therapy

## 2020-03-13 ENCOUNTER — Ambulatory Visit: Payer: Medicare PPO | Admitting: Physical Therapy

## 2020-03-13 DIAGNOSIS — M25511 Pain in right shoulder: Secondary | ICD-10-CM | POA: Diagnosis not present

## 2020-03-13 DIAGNOSIS — M25611 Stiffness of right shoulder, not elsewhere classified: Secondary | ICD-10-CM

## 2020-03-13 DIAGNOSIS — M6281 Muscle weakness (generalized): Secondary | ICD-10-CM

## 2020-03-13 NOTE — Patient Instructions (Signed)
Access Code: [email or web address] URL: [email or web address] Date: 03/13/2020 Prepared by: Jamey Reas  Exercises Standing Scapular Retraction - 2 x daily - 7 x weekly - 10 reps - 1 sets - 5 hold Circular Shoulder Pendulum with Table Support - 3 x daily - 7 x weekly - 10 reps - 2 sets Supine Shoulder Flexion Extension AAROM with Dowel - 3 x daily - 7 x weekly - 10 reps - 1 sets Supine Shoulder External Rotation in 45 Degrees Abduction AAROM with Dowel - 3 x daily - 7 x weekly - 10 reps - 1 sets Supine Shoulder Abduction AAROM with Dowel - 3 x daily - 7 x weekly - 10 reps - 1 sets Standing Shoulder Flexion Wall Walk - 3 x daily - 7 x weekly - 10 reps - 1 sets - 5 hold
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
## 2020-03-16 ENCOUNTER — Other Ambulatory Visit: Payer: Self-pay | Admitting: Family Medicine

## 2020-03-18 ENCOUNTER — Other Ambulatory Visit: Payer: Self-pay | Admitting: Family Medicine

## 2020-03-19 NOTE — Telephone Encounter (Signed)
[place] Night - Client TELEPHONE ADVICE RECORD AccessNurse Patient Name: [facility] Gender: Female DOB: January 11, 1946 Age: 74 Y 9 D Return Phone Number: [phone] (Primary) Address: City/State/Zip: [name] [address] Client [facility] Night - Client Client Site [place] Physician Ria Bush - MD Contact Type Call Who Is Calling Patient / Member / Family / Caregiver Call Type Triage / Clinical Relationship To Patient Self Return Phone Number [phone] (Primary) Chief Complaint Anxiety and Panic Attack Reason for Call Medication Question / Request Initial Comment Caller needs a refill of her hydroxyzine 10MG  since she is completely out. Caller said she takes this for her anxiety. Translation No Nurse Assessment Nurse: Reasor, RN, Leah Date/Time (Eastern Time): 03/18/2020 8:30:13 AM Confirm and document reason for call. If symptomatic, describe symptoms. ---Caller needs a refill of her hydroxyzine 10 mg since she is completely out per Dr. Ria Bush. Pt uses [facility] @ [phone]. No new s/sx Does the patient have any new or worsening symptoms? ---No Disp. Time [name] Time) Disposition Final User 03/18/2020 8:39:42 AM Pharmacy Call Reasor, RN, Denny Peon Reason: VO left on pharmacy VM per client directives - hydroxyzine 10 mg, # 3, no refills, per Dr Biagio Borg. Pt informed, instructed to call office tomorrow for full refill rx 03/18/2020 8:39:53 AM Clinical Call Yes Reasor, RN, Denny Peon

## 2020-03-19 NOTE — Telephone Encounter (Signed)
Pharmacy requests refill on: Paroxetine 20 mg   LAST REFILL: 03/16/2019 (Q-90, R-3)  LAST OV: 02/07/2020 NEXT OV: 04/02/2020 PHARMACY: [facility] [place], [place]

## 2020-03-25 ENCOUNTER — Other Ambulatory Visit: Payer: Self-pay | Admitting: Family Medicine

## 2020-03-25 DIAGNOSIS — N1831 Chronic kidney disease, stage 3a: Secondary | ICD-10-CM

## 2020-03-25 DIAGNOSIS — E039 Hypothyroidism, unspecified: Secondary | ICD-10-CM

## 2020-03-25 DIAGNOSIS — E785 Hyperlipidemia, unspecified: Secondary | ICD-10-CM

## 2020-03-27 ENCOUNTER — Telehealth: Payer: Self-pay | Admitting: Family Medicine

## 2020-03-27 ENCOUNTER — Other Ambulatory Visit: Payer: Self-pay | Admitting: Family Medicine

## 2020-03-27 MED ORDER — HYDROXYZINE HCL 10 MG PO TABS
10.0000 mg | ORAL_TABLET | Freq: Two times a day (BID) | ORAL | 0 refills | Status: DC | PRN
Start: 2020-03-27 — End: 2020-04-05

## 2020-03-27 NOTE — Addendum Note (Signed)
Addended by: Roena Malady on: 03/27/2020 11:32 AM   Modules accepted: Orders

## 2020-03-27 NOTE — Telephone Encounter (Signed)
E-scribed refill 

## 2020-03-27 NOTE — Telephone Encounter (Signed)
Pt called in due to she is out of her medication and on 03/18/20 she only got 3 pills and she didn't get the whole bottle and not she is completely out and she doesn't have anything else. Its the Hydroxyzine.

## 2020-03-28 ENCOUNTER — Ambulatory Visit (INDEPENDENT_AMBULATORY_CARE_PROVIDER_SITE_OTHER): Payer: Medicare PPO

## 2020-03-28 ENCOUNTER — Other Ambulatory Visit: Payer: Self-pay

## 2020-03-28 ENCOUNTER — Other Ambulatory Visit (INDEPENDENT_AMBULATORY_CARE_PROVIDER_SITE_OTHER): Payer: Medicare PPO

## 2020-03-28 DIAGNOSIS — Z0000 Encounter for general adult medical examination without abnormal findings: Secondary | ICD-10-CM

## 2020-03-28 DIAGNOSIS — E039 Hypothyroidism, unspecified: Secondary | ICD-10-CM | POA: Diagnosis not present

## 2020-03-28 DIAGNOSIS — N1831 Chronic kidney disease, stage 3a: Secondary | ICD-10-CM

## 2020-03-28 DIAGNOSIS — E785 Hyperlipidemia, unspecified: Secondary | ICD-10-CM | POA: Diagnosis not present

## 2020-03-28 LAB — LIPID PANEL
Cholesterol: 177 mg/dL (ref 0–200)
HDL: 53.4 mg/dL (ref >39.00)
LDL Cholesterol: 110 mg/dL — ABNORMAL HIGH (ref 0–99)
NonHDL: 123.98
Total CHOL/HDL Ratio: 3
Triglycerides: 69 mg/dL (ref 0.0–149.0)
VLDL: 13.8 mg/dL (ref 0.0–40.0)

## 2020-03-28 LAB — CBC WITH DIFFERENTIAL/PLATELET
Basophils Absolute: 0 10*3/uL (ref 0.0–0.1)
Basophils Relative: 0.5 % (ref 0.0–3.0)
Eosinophils Absolute: 0.2 10*3/uL (ref 0.0–0.7)
Eosinophils Relative: 3.4 % (ref 0.0–5.0)
HCT: 40.5 % (ref 36.0–46.0)
Hemoglobin: 13.5 g/dL (ref 12.0–15.0)
Lymphocytes Relative: 37.3 % (ref 12.0–46.0)
Lymphs Abs: 1.8 10*3/uL (ref 0.7–4.0)
MCHC: 33.3 g/dL (ref 30.0–36.0)
MCV: 86.9 fl (ref 78.0–100.0)
Monocytes Absolute: 0.4 10*3/uL (ref 0.1–1.0)
Monocytes Relative: 8.3 % (ref 3.0–12.0)
Neutro Abs: 2.4 10*3/uL (ref 1.4–7.7)
Neutrophils Relative %: 50.5 % (ref 43.0–77.0)
Platelets: 177 10*3/uL (ref 150.0–400.0)
RBC: 4.67 Mil/uL (ref 3.87–5.11)
RDW: 13.2 % (ref 11.5–15.5)
WBC: 4.8 10*3/uL (ref 4.0–10.5)

## 2020-03-28 LAB — MICROALBUMIN / CREATININE URINE RATIO
Creatinine,U: 101.7 mg/dL
Microalb Creat Ratio: 1.1 mg/g (ref 0.0–30.0)
Microalb, Ur: 1.1 mg/dL (ref 0.0–1.9)

## 2020-03-28 LAB — COMPREHENSIVE METABOLIC PANEL
ALT: 16 U/L (ref 0–35)
AST: 17 U/L (ref 0–37)
Albumin: 4.2 g/dL (ref 3.5–5.2)
Alkaline Phosphatase: 113 U/L (ref 39–117)
BUN: 19 mg/dL (ref 6–23)
CO2: 27 mEq/L (ref 19–32)
Calcium: 8.7 mg/dL (ref 8.4–10.5)
Chloride: 106 mEq/L (ref 96–112)
Creatinine, Ser: 1.06 mg/dL (ref 0.40–1.20)
GFR: 51.55 mL/min — ABNORMAL LOW (ref >60.00)
Glucose, Bld: 119 mg/dL — ABNORMAL HIGH (ref 70–99)
Potassium: 3.7 mEq/L (ref 3.5–5.1)
Sodium: 142 mEq/L (ref 135–145)
Total Bilirubin: 0.4 mg/dL (ref 0.2–1.2)
Total Protein: 6.2 g/dL (ref 6.0–8.3)

## 2020-03-28 LAB — TSH: TSH: 2.47 u[IU]/mL (ref 0.35–4.50)

## 2020-03-28 LAB — VITAMIN D 25 HYDROXY (VIT D DEFICIENCY, FRACTURES): VITD: 31.35 ng/mL (ref 30.00–100.00)

## 2020-03-28 NOTE — Patient Instructions (Signed)
Karen [name] , Thank you for taking time to come for your Medicare Wellness Visit. I appreciate your ongoing commitment to your health goals. Please review the following plan we discussed and let me know if I can assist you in the future.   Screening recommendations/referrals: Colonoscopy: Up to date, completed 01/08/2015, due 12/2024 Mammogram: due, will discuss with provider at physical  Bone Density: due, will discuss with provider at physical  Recommended yearly ophthalmology/optometry visit for glaucoma screening and checkup Recommended yearly dental visit for hygiene and checkup  Vaccinations: Influenza vaccine: declined Pneumococcal vaccine: Completed series Tdap vaccine: Up to date, completed 07/30/2016, due 07/2026 Shingles vaccine: due, check with your insurance regarding coverage if interested  Covid-19:declined booster  Advanced directives: Advance directive discussed with you today. Even though you declined this today please call our office should you change your mind and we can give you the proper paperwork for you to fill out.  Conditions/risks identified: hyperlipidemia  Next appointment: Follow up in one year for your annual wellness visit    Preventive Care 75 Years and Older, Female Preventive care refers to lifestyle choices and visits with your health care provider that can promote health and wellness. What does preventive care include?  A yearly physical exam. This is also called an annual well check.  Dental exams once or twice a year.  Routine eye exams. Ask your health care provider how often you should have your eyes checked.  Personal lifestyle choices, including:  Daily care of your teeth and gums.  Regular physical activity.  Eating a healthy diet.  Avoiding tobacco and drug use.  Limiting alcohol use.  Practicing safe sex.  Taking low-dose aspirin every day.  Taking vitamin and mineral supplements as recommended by your health care  provider. What happens during an annual well check? The services and screenings done by your health care provider during your annual well check will depend on your age, overall health, lifestyle risk factors, and family history of disease. Counseling  Your health care provider may ask you questions about your:  Alcohol use.  Tobacco use.  Drug use.  Emotional well-being.  Home and relationship well-being.  Sexual activity.  Eating habits.  History of falls.  Memory and ability to understand (cognition).  Work and work Astronomer.  Reproductive health. Screening  You may have the following tests or measurements:  Height, weight, and BMI.  Blood pressure.  Lipid and cholesterol levels. These may be checked every 5 years, or more frequently if you are over 33 years old.  Skin check.  Lung cancer screening. You may have this screening every year starting at age 75 if you have a 30-pack-year history of smoking and currently smoke or have quit within the past 15 years.  Fecal occult blood test (FOBT) of the stool. You may have this test every year starting at age 75.  Flexible sigmoidoscopy or colonoscopy. You may have a sigmoidoscopy every 5 years or a colonoscopy every 10 years starting at age 75.  Hepatitis C blood test.  Hepatitis B blood test.  Sexually transmitted disease (STD) testing.  Diabetes screening. This is done by checking your blood sugar (glucose) after you have not eaten for a while (fasting). You may have this done every 1-3 years.  Bone density scan. This is done to screen for osteoporosis. You may have this done starting at age 75.  Mammogram. This may be done every 1-2 years. Talk to your health care provider about how often you  should have regular mammograms. Talk with your health care provider about your test results, treatment options, and if necessary, the need for more tests. Vaccines  Your health care provider may recommend certain  vaccines, such as:  Influenza vaccine. This is recommended every year.  Tetanus, diphtheria, and acellular pertussis (Tdap, Td) vaccine. You may need a Td booster every 10 years.  Zoster vaccine. You may need this after age 75.  Pneumococcal 13-valent conjugate (PCV13) vaccine. One dose is recommended after age 75.  Pneumococcal polysaccharide (PPSV23) vaccine. One dose is recommended after age 75. Talk to your health care provider about which screenings and vaccines you need and how often you need them. This information is not intended to replace advice given to you by your health care provider. Make sure you discuss any questions you have with your health care provider. Document Released: 04/13/2015 Document Revised: 12/05/2015 Document Reviewed: 01/16/2015 Elsevier Interactive Patient Education  2017 [employer] Prevention in the Home Falls can cause injuries. They can happen to people of all ages. There are many things you can do to make your home safe and to help prevent falls. What can I do on the outside of my home?  Regularly fix the edges of walkways and driveways and fix any cracks.  Remove anything that might make you trip as you walk through a door, such as a raised step or threshold.  Trim any bushes or trees on the path to your home.  Use bright outdoor lighting.  Clear any walking paths of anything that might make someone trip, such as rocks or tools.  Regularly check to see if handrails are loose or broken. Make sure that both sides of any steps have handrails.  Any raised decks and porches should have guardrails on the edges.  Have any leaves, snow, or ice cleared regularly.  Use sand or salt on walking paths during winter.  Clean up any spills in your garage right away. This includes oil or grease spills. What can I do in the bathroom?  Use night lights.  Install grab bars by the toilet and in the tub and shower. Do not use towel bars as grab  bars.  Use non-skid mats or decals in the tub or shower.  If you need to sit down in the shower, use a plastic, non-slip stool.  Keep the floor dry. Clean up any water that spills on the floor as soon as it happens.  Remove soap buildup in the tub or shower regularly.  Attach bath mats securely with double-sided non-slip rug tape.  Do not have throw rugs and other things on the floor that can make you trip. What can I do in the bedroom?  Use night lights.  Make sure that you have a light by your bed that is easy to reach.  Do not use any sheets or blankets that are too big for your bed. They should not hang down onto the floor.  Have a firm chair that has side arms. You can use this for support while you get dressed.  Do not have throw rugs and other things on the floor that can make you trip. What can I do in the kitchen?  Clean up any spills right away.  Avoid walking on wet floors.  Keep items that you use a lot in easy-to-reach places.  If you need to reach something above you, use a strong step stool that has a grab bar.  Keep electrical cords out  of the way.  Do not use floor polish or wax that makes floors slippery. If you must use wax, use non-skid floor wax.  Do not have throw rugs and other things on the floor that can make you trip. What can I do with my stairs?  Do not leave any items on the stairs.  Make sure that there are handrails on both sides of the stairs and use them. Fix handrails that are broken or loose. Make sure that handrails are as long as the stairways.  Check any carpeting to make sure that it is firmly attached to the stairs. Fix any carpet that is loose or worn.  Avoid having throw rugs at the top or bottom of the stairs. If you do have throw rugs, attach them to the floor with carpet tape.  Make sure that you have a light switch at the top of the stairs and the bottom of the stairs. If you do not have them, ask someone to add them for  you. What else can I do to help prevent falls?  Wear shoes that:  Do not have high heels.  Have rubber bottoms.  Are comfortable and fit you well.  Are closed at the toe. Do not wear sandals.  If you use a stepladder:  Make sure that it is fully opened. Do not climb a closed stepladder.  Make sure that both sides of the stepladder are locked into place.  Ask someone to hold it for you, if possible.  Clearly mark and make sure that you can see:  Any grab bars or handrails.  First and last steps.  Where the edge of each step is.  Use tools that help you move around (mobility aids) if they are needed. These include:  Canes.  Walkers.  Scooters.  Crutches.  Turn on the lights when you go into a dark area. Replace any light bulbs as soon as they burn out.  Set up your furniture so you have a clear path. Avoid moving your furniture around.  If any of your floors are uneven, fix them.  If there are any pets around you, be aware of where they are.  Review your medicines with your doctor. Some medicines can make you feel dizzy. This can increase your chance of falling. Ask your doctor what other things that you can do to help prevent falls. This information is not intended to replace advice given to you by your health care provider. Make sure you discuss any questions you have with your health care provider. Document Released: 01/11/2009 Document Revised: 08/23/2015 Document Reviewed: 04/21/2014 Elsevier Interactive Patient Education  2017 [employer].

## 2020-03-28 NOTE — Addendum Note (Signed)
Addended by: Aquilla Solian on: 03/28/2020 08:52 AM   Modules accepted: Orders
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
## 2020-03-28 NOTE — Progress Notes (Signed)
PCP notes:  Health Maintenance: Flu- declined Covid booster- declined Mammogram- due Dexa- due   Abnormal Screenings: none   Patient concerns: none   Nurse concerns: none   Next PCP appt.: 04/02/2020 @ 2:30 pm

## 2020-04-02 ENCOUNTER — Ambulatory Visit (INDEPENDENT_AMBULATORY_CARE_PROVIDER_SITE_OTHER): Payer: Medicare PPO | Admitting: Family Medicine

## 2020-04-02 ENCOUNTER — Encounter: Payer: Self-pay | Admitting: Family Medicine

## 2020-04-02 VITALS — BP 124/62 | HR 82 | Temp 94.6°F | Ht 63.0 in | Wt 160.0 lb

## 2020-04-02 DIAGNOSIS — E7800 Pure hypercholesterolemia, unspecified: Secondary | ICD-10-CM | POA: Diagnosis not present

## 2020-04-02 DIAGNOSIS — Z23 Encounter for immunization: Secondary | ICD-10-CM

## 2020-04-02 DIAGNOSIS — N1831 Chronic kidney disease, stage 3a: Secondary | ICD-10-CM | POA: Diagnosis not present

## 2020-04-02 DIAGNOSIS — S42291D Other displaced fracture of upper end of right humerus, subsequent encounter for fracture with routine healing: Secondary | ICD-10-CM | POA: Diagnosis not present

## 2020-04-02 DIAGNOSIS — F4323 Adjustment disorder with mixed anxiety and depressed mood: Secondary | ICD-10-CM | POA: Diagnosis not present

## 2020-04-02 DIAGNOSIS — Z7189 Other specified counseling: Secondary | ICD-10-CM | POA: Diagnosis not present

## 2020-04-02 DIAGNOSIS — Z0001 Encounter for general adult medical examination with abnormal findings: Secondary | ICD-10-CM

## 2020-04-02 DIAGNOSIS — Z0000 Encounter for general adult medical examination without abnormal findings: Secondary | ICD-10-CM

## 2020-04-02 DIAGNOSIS — M8588 Other specified disorders of bone density and structure, other site: Secondary | ICD-10-CM | POA: Diagnosis not present

## 2020-04-02 DIAGNOSIS — E039 Hypothyroidism, unspecified: Secondary | ICD-10-CM

## 2020-04-02 DIAGNOSIS — K5909 Other constipation: Secondary | ICD-10-CM

## 2020-04-02 MED ORDER — PAROXETINE HCL 20 MG PO TABS: 20.0000 mg | ORAL_TABLET | Freq: Every day | ORAL | 3 refills | Status: DC

## 2020-04-02 MED ORDER — LEVOTHYROXINE SODIUM 50 MCG PO TABS: 50.0000 ug | ORAL_TABLET | Freq: Every day | ORAL | 3 refills | Status: DC

## 2020-04-02 MED ORDER — ACETAMINOPHEN 500 MG PO TABS: 500.0000 mg | ORAL_TABLET | Freq: Four times a day (QID) | ORAL | Status: AC | PRN

## 2020-04-02 NOTE — Assessment & Plan Note (Addendum)
Advanced directives: thinks she has one at home. Asked to bring me copy. Doesn't want prolonged life support if terminal condition "don't want to be kept alive", ok for reversible condition. HCPOA is Todd son then Donna daughter but this has not been updated.  °

## 2020-04-02 NOTE — Progress Notes (Unsigned)
Patient ID: Karen [name], female    DOB: 08-11-1945, 75 y.o.   MRN: [id number]  This visit was conducted in person.  BP 124/62   Pulse 82   Temp (!) 94.6 F (34.8 C) (Temporal)   Ht 5\' 3"  (1.6 m)   Wt 160 lb (72.6 kg)   LMP  (LMP Unknown)   SpO2 97%   BMI 28.34 kg/m    CC: CPE Subjective:   HPI: Karen [name] is a 75 y.o. female presenting on 04/02/2020 for Medicare Wellness   Saw health advisor last week for medicare wellness visit. Note reviewed.    No exam data present  Flowsheet Row Office Visit from 04/02/2020 in [facility] at [facility] Total Score 0      Fall Risk  03/28/2020 03/16/2019 03/12/2018 02/17/2017 02/08/2016  Falls in the past year? 1 1 0 No No  Comment fell off table - - - -  Number falls in past yr: 0 0 - - -  Injury with Fall? 1 1 - - -  Comment broke her shoulder - - - -  Risk for fall due to : History of fall(s) - - - -  Follow up Falls evaluation completed;Falls prevention discussed - - - -     Humeral fracture s/p ORIF 01/2020 (Dr 02/2020).   Hospitalized 11/2018 with urosepsis (largely asymptomatic) with SVT and elevated cardiac enzymes due to demand inschemia. Abnormal EKG showing LAD, poor R wave progression, LAFB. Echocardiogram overall reassuring (impaired LV relaxation).   Fhmx CAD. Older sister with dementia.   Preventative: COLONOSCOPY Date: 12/2014 1 polyp - will request recordsagaintoday ([name])(ROI signed)  Mammogram 05/2018 birads1 -due for repeat  Well woman - s/p hysterectomy for benign reasons. Ovaries removed as well. DEXADate: 11/2012 T score -2.4 AP spine Osteopenia. Flushot yearly  COVID vaccine Pfizer 05/2019, 05/2019, declines booster  Pneumovax 2014,prevnar 2015  Tdap 07/2016  Zostavax 2012 Shingrix - discussed - to check with local pharmacy Advanced directives: thinks she has one at home. Asked to bring me copy. Doesn't want prolonged life support if terminal condition "don't want to be kept  alive", ok for reversible condition. HCPOA isTodd son then 2013 daughterbut this has not been updated.  Seat belt use discussed. Sunscreen use discussed. No changing moles on skin. 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 Interim medical history since our last visit reviewed. Allergies and medications reviewed and updated. Outpatient Medications Prior to Visit  Medication Sig Dispense Refill  . calcium-vitamin D (OSCAL WITH D) 500-200 MG-UNIT tablet Take 1 tablet by mouth 3 (three) times daily. 90 tablet 6  . Cholecalciferol (VITAMIN D3) 50 MCG (2000 UT) TABS Take 2,000 Units by mouth daily.    04-03-1975 ([facility] BLEND PO) Take 1 tablet by mouth daily. Immunity Formula by [name]    . hydrocortisone (ANUSOL-HC) 25 MG suppository UNWRAP & INSERT 1 SUPPOSITORY RECTALLY 2 TIMES DAILY AS NEEDED FOR HEMORRHOIDS (USE SPARINGLY) (Patient taking differently: Place 25 mg rectally  2 (two) times daily as needed for hemorrhoids. UNWRAP & INSERT 1 SUPPOSITORY RECTALLY 2 TIMES DAILY AS NEEDED FOR HEMORRHOIDS (USE SPARINGLY)) 12 suppository 0  . methocarbamol (ROBAXIN) 500 MG tablet Take 1 tablet (500 mg total) by mouth every 6 (six) hours as needed for muscle spasms. 30 tablet 2  . Multiple Vitamin (MULTIVITAMIN WITH MINERALS) TABS tablet Take 1 tablet by mouth daily. Vita-[facility] Women Multivitamn    . triamcinolone cream (KENALOG) 0.1 % Apply 1 application topically 2 (two) times daily. Apply to AA on trunk and extremities  for poison oak, do not use on face underarms or groin 45 g 0  . vitamin E 180 MG (400 UNITS) capsule Take 400 Units by mouth daily.    Marland Kitchen ascorbic Acid (VITAMIN C) 500 MG CPCR Take 500 mg by mouth 3 (three) times a week.    . Calcium Carb-Cholecalciferol (CALCIUM-VITAMIN D) 600-400 MG-UNIT TABS Take 1 tablet by mouth daily.     . hydrOXYzine (ATARAX/VISTARIL) 10 MG tablet Take 1-2 tablets (10-20 mg total) by mouth 2 (two) times daily as needed for anxiety. 30 tablet 0  . ibuprofen (ADVIL) 200 MG tablet Take 400-600 mg by mouth every 8 (eight) hours as needed (for pain.).    Marland Kitchen levothyroxine (SYNTHROID) 50 MCG tablet TAKE 1 TABLET BY MOUTH EVERY DAY 90 tablet 0  . oxyCODONE-acetaminophen (PERCOCET) 5-325 MG tablet Take 1-2 tablets by mouth every 8 (eight) hours as needed for severe pain. 30 tablet 0  . PARoxetine (PAXIL) 20 MG tablet TAKE 1 TABLET BY MOUTH EVERY DAY 90 tablet 1  . Zinc 220 (50 Zn) MG CAPS TAKE 1 CAPSULE BY MOUTH DAILY. 42 capsule 0   No facility-administered medications prior to visit.     Per HPI unless specifically indicated in ROS section below Review of Systems  Constitutional: Negative for activity change, appetite change, chills, fatigue, fever and unexpected weight change.  HENT: Negative for hearing loss.   Eyes: Negative for visual disturbance.  Respiratory: Negative for cough, chest tightness, shortness of breath and wheezing.   Cardiovascular: Negative for chest pain, palpitations and leg swelling.  Gastrointestinal: Negative for abdominal distention, abdominal pain, blood in stool, constipation, diarrhea, nausea and vomiting.  Genitourinary: Negative for difficulty urinating and hematuria.  Musculoskeletal: Negative for arthralgias, myalgias and neck pain.  Skin: Negative for rash.  Neurological: Negative for dizziness, seizures, syncope and headaches.  Hematological: Negative for adenopathy. Does not bruise/bleed easily.  Psychiatric/Behavioral: Negative for  dysphoric mood. The patient is not nervous/anxious.    Objective:  BP 124/62   Pulse 82   Temp (!) 94.6 F (34.8 C) (Temporal)   Ht 5\' 3"  (1.6 m)   Wt 160 lb (72.6 kg)   LMP  (LMP Unknown)   SpO2 97%   BMI 28.34 kg/m   Wt Readings from Last 3 Encounters:  04/02/20 160 lb (72.6 kg)  02/09/20 164 lb (74.4 kg)  02/07/20 167 lb 8 oz (76 kg)      Physical Exam Vitals and nursing note reviewed.  Constitutional:      General: She is not in acute distress.    Appearance: Normal appearance. She is well-developed and well-nourished. She is not ill-appearing.  HENT:     Head: Normocephalic and atraumatic.     Right Ear: Hearing, tympanic membrane, ear canal and external ear normal.     Left Ear: Hearing, tympanic membrane, ear canal and external ear normal.     Nose: Nose normal.  Mouth/Throat:     Mouth: Oropharynx is clear and moist and mucous membranes are normal.     Pharynx: No posterior oropharyngeal edema.  Eyes:     General: No scleral icterus.    Extraocular Movements: Extraocular movements intact and EOM normal.     Conjunctiva/sclera: Conjunctivae normal.     Pupils: Pupils are equal, round, and reactive to light.  Neck:     Thyroid: No thyroid mass or thyromegaly.     Vascular: No carotid bruit.  Cardiovascular:     Rate and Rhythm: Normal rate and regular rhythm.     Pulses: Normal pulses and intact distal pulses.          Radial pulses are 2+ on the right side and 2+ on the left side.     Heart sounds: Normal heart sounds. No murmur heard.   Pulmonary:     Effort: Pulmonary effort is normal. No respiratory distress.     Breath sounds: Normal breath sounds. No wheezing, rhonchi or rales.  Abdominal:     General: Abdomen is flat. Bowel sounds are normal. There is no distension.     Palpations: Abdomen is soft. There is no mass.     Tenderness: There is no abdominal tenderness. There is no guarding or rebound.     Hernia: No hernia is present.   Musculoskeletal:        General: No edema. Normal range of motion.     Cervical back: Normal range of motion and neck supple.     Right lower leg: No edema.     Left lower leg: No edema.  Lymphadenopathy:     Cervical: No cervical adenopathy.  Skin:    General: Skin is warm and dry.     Findings: No rash.  Neurological:     General: No focal deficit present.     Mental Status: She is alert and oriented to person, place, and time.     Comments: CN grossly intact, station and gait intact  Psychiatric:        Mood and Affect: Mood and affect and mood normal.        Behavior: Behavior normal.        Thought Content: Thought content normal.        Judgment: Judgment normal.       Results for orders placed or performed in visit on 03/28/20  VITAMIN D 25 Hydroxy (Vit-D Deficiency, Fractures)  Result Value Ref Range   VITD 31.35 30.00 - 100.00 ng/mL  CBC with Differential/Platelet  Result Value Ref Range   WBC 4.8 4.0 - 10.5 K/uL   RBC 4.67 3.87 - 5.11 Mil/uL   Hemoglobin 13.5 12.0 - 15.0 g/dL   HCT 40.5 36.0 - 46.0 %   MCV 86.9 78.0 - 100.0 fl   MCHC 33.3 30.0 - 36.0 g/dL   RDW 13.2 11.5 - 15.5 %   Platelets 177.0 150.0 - 400.0 K/uL   Neutrophils Relative % 50.5 43.0 - 77.0 %   Lymphocytes Relative 37.3 12.0 - 46.0 %   Monocytes Relative 8.3 3.0 - 12.0 %   Eosinophils Relative 3.4 0.0 - 5.0 %   Basophils Relative 0.5 0.0 - 3.0 %   Neutro Abs 2.4 1.4 - 7.7 K/uL   Lymphs Abs 1.8 0.7 - 4.0 K/uL   Monocytes Absolute 0.4 0.1 - 1.0 K/uL   Eosinophils Absolute 0.2 0.0 - 0.7 K/uL   Basophils Absolute 0.0 0.0 - 0.1 K/uL  TSH  Result  Value Ref Range   TSH 2.47 0.35 - 4.50 uIU/mL  Comprehensive metabolic panel  Result Value Ref Range   Sodium 142 135 - 145 mEq/L   Potassium 3.7 3.5 - 5.1 mEq/L   Chloride 106 96 - 112 mEq/L   CO2 27 19 - 32 mEq/L   Glucose, Bld 119 (H) 70 - 99 mg/dL   BUN 19 6 - 23 mg/dL   Creatinine, Ser 1.06 0.40 - 1.20 mg/dL   Total Bilirubin 0.4 0.2 - 1.2  mg/dL   Alkaline Phosphatase 113 39 - 117 U/L   AST 17 0 - 37 U/L   ALT 16 0 - 35 U/L   Total Protein 6.2 6.0 - 8.3 g/dL   Albumin 4.2 3.5 - 5.2 g/dL   GFR 51.55 (L) >60.00 mL/min   Calcium 8.7 8.4 - 10.5 mg/dL  Lipid panel  Result Value Ref Range   Cholesterol 177 0 - 200 mg/dL   Triglycerides 69.0 0.0 - 149.0 mg/dL   HDL 53.40 >39.00 mg/dL   VLDL 13.8 0.0 - 40.0 mg/dL   LDL Cholesterol 110 (H) 0 - 99 mg/dL   Total CHOL/HDL Ratio 3    NonHDL 123.98   Microalbumin / creatinine urine ratio  Result Value Ref Range   Microalb, Ur 1.1 0.0 - 1.9 mg/dL   Creatinine,U 101.7 mg/dL   Microalb Creat Ratio 1.1 0.0 - 30.0 mg/g   Assessment & Plan:  This visit occurred during the SARS-CoV-2 public health emergency.  Safety protocols were in place, including screening questions prior to the visit, additional usage of staff PPE, and extensive cleaning of exam room while observing appropriate contact time as indicated for disinfecting solutions.   Problem List Items Addressed This Visit    Osteopenia    Update DEXA - last checked 2014. She continues calcium and vitamin D daily.       Relevant Orders   DG Bone Density   Hypothyroidism (acquired)    Chronic, stable on low dose levothyroxine - she notes this has significantly helped h/o chronic constipation.       Relevant Medications   levothyroxine (SYNTHROID) 50 MCG tablet   HLD (hyperlipidemia)    Chronic, adequate for age, will remain off statin.  The 10-year ASCVD risk score Mikey Bussing DC Brooke Bonito., et al., 2013) is: 15%   Values used to calculate the score:     Age: 50 years     Sex: Female     Is Non-Hispanic African American: No     Diabetic: No     Tobacco smoker: No     Systolic Blood Pressure: [id number] mmHg     Is BP treated: No     HDL Cholesterol: 53.4 mg/dL     Total Cholesterol: 177 mg/dL       Health maintenance examination - Primary    Preventative protocols reviewed and updated unless pt declined. Discussed healthy diet and  lifestyle.       Closed fracture of right proximal humerus    S/p ORIF 01/2020.  Occurred after fall at home off table - she states she was on table trying to reach her bird.  Appreciate ortho care - she is undergoing PT.       CKD (chronic kidney disease) stage 3, GFR 30-59 ml/min (HCC)    Chronic, stable. Reviewed avoiding NSAIDs and dark sodas and good hydration status.       Chronic constipation    Chronic, improved with thyroid replacement.  Advanced care planning/counseling discussion    Advanced directives: thinks she has one at home. Asked to bring me copy. Doesn't want prolonged life support if terminal condition "don't want to be kept alive", ok for reversible condition. HCPOA isTodd son then Butch Penny daughterbut this has not been updated.       Adjustment disorder with mixed anxiety and depressed mood    Chronic, stable period on paxil + hydroxyzine PRN anxiety.       Other Visit Diagnoses    Need for immunization against influenza       Relevant Orders   Flu Vaccine QUAD High Dose(Fluad) (Completed)       Meds ordered this encounter  Medications  . PARoxetine (PAXIL) 20 MG tablet    Sig: Take 1 tablet (20 mg total) by mouth daily.    Dispense:  90 tablet    Refill:  3  . levothyroxine (SYNTHROID) 50 MCG tablet    Sig: Take 1 tablet (50 mcg total) by mouth daily.    Dispense:  90 tablet    Refill:  3  . acetaminophen (TYLENOL) 500 MG tablet    Sig: Take 1 tablet (500 mg total) by mouth every 6 (six) hours as needed.   Orders Placed This Encounter  Procedures  . DG Bone Density    Standing Status:   Future    Standing Expiration Date:   04/02/2021    Order Specific Question:   Reason for Exam (SYMPTOM  OR DIAGNOSIS REQUIRED)    Answer:   osteopenia    Order Specific Question:   Preferred imaging location?    Answer:   [facility]  . Flu Vaccine QUAD High Dose(Fluad)    Patient instructions: Flu shot today  Talk with Dr Collene Mares about repeat  colonoscopy.  Call to schedule mammogram at your convenience: [facility] [phone] Schedule bone density scan on same day as your mammogram.  If interested, check with pharmacy about new 2 shot shingles series (shingrix).  Check with pharmacy about COVID booster Therapist, music or [place] either is ok).  Check at home on living will. If you do have this, bring [place] a copy. If you don't and need assistance, let us know.  Cholesterol and kidneys were better this year - continue healthy diet and regular exercise routine.  Return in 6 months for follow up visit.   Follow up plan: Return in about 6 months (around 09/30/2020).  Ria Bush, MD

## 2020-04-02 NOTE — Assessment & Plan Note (Signed)
Preventative protocols reviewed and updated unless pt declined. Discussed healthy diet and lifestyle.  

## 2020-04-02 NOTE — Patient Instructions (Addendum)
Flu shot today  Talk with Dr Collene Mares about repeat colonoscopy.  Call to schedule mammogram at your convenience: [facility] [phone] Schedule bone density scan on same day as your mammogram.  If interested, check with pharmacy about new 2 shot shingles series (shingrix).  Check with pharmacy about COVID booster Therapist, music or [place] either is ok).  Check at home on living will. If you do have this, bring [place] a copy. If you don't and need assistance, let us know.  Cholesterol and kidneys were better this year - continue healthy diet and regular exercise routine.  Return in 6 months for follow up visit.   Health Maintenance After Age 63 After age 53, you are at a higher risk for certain long-term diseases and infections as well as injuries from falls. Falls are a major cause of broken bones and head injuries in people who are older than age 21. Getting regular preventive care can help to keep you healthy and well. Preventive care includes getting regular testing and making lifestyle changes as recommended by your health care provider. Talk with your health care provider about:  Which screenings and tests you should have. A screening is a test that checks for a disease when you have no symptoms.  A diet and exercise plan that is right for you. What should I know about screenings and tests to prevent falls? Screening and testing are the best ways to find a health problem early. Early diagnosis and treatment give you the best chance of managing medical conditions that are common after age 44. Certain conditions and lifestyle choices may make you more likely to have a fall. Your health care provider may recommend:  Regular vision checks. Poor vision and conditions such as cataracts can make you more likely to have a fall. If you wear glasses, make sure to get your prescription updated if your vision changes.  Medicine review. Work with your health care provider to regularly review all  of the medicines you are taking, including over-the-counter medicines. Ask your health care provider about any side effects that may make you more likely to have a fall. Tell your health care provider if any medicines that you take make you feel dizzy or sleepy.  Osteoporosis screening. Osteoporosis is a condition that causes the bones to get weaker. This can make the bones weak and cause them to break more easily.  Blood pressure screening. Blood pressure changes and medicines to control blood pressure can make you feel dizzy.  Strength and balance checks. Your health care provider may recommend certain tests to check your strength and balance while standing, walking, or changing positions.  Foot health exam. Foot pain and numbness, as well as not wearing proper footwear, can make you more likely to have a fall.  Depression screening. You may be more likely to have a fall if you have a fear of falling, feel emotionally low, or feel unable to do activities that you used to do.  Alcohol use screening. Using too much alcohol can affect your balance and may make you more likely to have a fall. What actions can I take to lower my risk of falls? General instructions  Talk with your health care provider about your risks for falling. Tell your health care provider if: ? You fall. Be sure to tell your health care provider about all falls, even ones that seem minor. ? You feel dizzy, sleepy, or off-balance.  Take over-the-counter and prescription medicines only as told  by your health care provider. These include any supplements.  Eat a healthy diet and maintain a healthy weight. A healthy diet includes low-fat dairy products, low-fat (lean) meats, and fiber from whole grains, beans, and lots of fruits and vegetables. Home safety  Remove any tripping hazards, such as rugs, cords, and clutter.  Install safety equipment such as grab bars in bathrooms and safety rails on stairs.  Keep rooms and  walkways well-lit. Activity   Follow a regular exercise program to stay fit. This will help you maintain your balance. Ask your health care provider what types of exercise are appropriate for you.  If you need a cane or walker, use it as recommended by your health care provider.  Wear supportive shoes that have nonskid soles. Lifestyle  Do not drink alcohol if your health care provider tells you not to drink.  If you drink alcohol, limit how much you have: ? 0-1 drink a day for women. ? 0-2 drinks a day for men.  Be aware of how much alcohol is in your drink. In the [place] one drink equals one typical bottle of beer (12 oz), one-half glass of wine (5 oz), or one shot of hard liquor (1 oz).  Do not use any products that contain nicotine or tobacco, such as cigarettes and e-cigarettes. If you need help quitting, ask your health care provider. Summary  Having a healthy lifestyle and getting preventive care can help to protect your health and wellness after age 33.  Screening and testing are the best way to find a health problem early and help you avoid having a fall. Early diagnosis and treatment give you the best chance for managing medical conditions that are more common for people who are older than age 43.  Falls are a major cause of broken bones and head injuries in people who are older than age 62. Take precautions to prevent a fall at home.  Work with your health care provider to learn what changes you can make to improve your health and wellness and to prevent falls. This information is not intended to replace advice given to you by your health care provider. Make sure you discuss any questions you have with your health care provider. Document Revised: 07/08/2018 Document Reviewed: 01/28/2017 Elsevier Patient Education  2020 [employer].

## 2020-04-03 ENCOUNTER — Other Ambulatory Visit: Payer: Self-pay

## 2020-04-03 ENCOUNTER — Ambulatory Visit: Payer: Medicare PPO | Admitting: Physical Therapy

## 2020-04-03 ENCOUNTER — Encounter: Payer: Self-pay | Admitting: Physical Therapy

## 2020-04-03 DIAGNOSIS — M25511 Pain in right shoulder: Secondary | ICD-10-CM

## 2020-04-03 DIAGNOSIS — M6281 Muscle weakness (generalized): Secondary | ICD-10-CM | POA: Diagnosis not present

## 2020-04-03 DIAGNOSIS — M25611 Stiffness of right shoulder, not elsewhere classified: Secondary | ICD-10-CM | POA: Diagnosis not present
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
## 2020-04-03 NOTE — Patient Instructions (Signed)
Access Code: [email or web address] URL: [email or web address] Date: 04/03/2020 Prepared by: Moshe Cipro  Exercises Standing Scapular Retraction - 2 x daily - 7 x weekly - 10 reps - 1 sets - 5 hold Circular Shoulder Pendulum with Table Support - 3 x daily - 7 x weekly - 10 reps - 2 sets Supine Shoulder Flexion Extension AAROM with Dowel - 3 x daily - 7 x weekly - 10 reps - 1 sets Supine Shoulder External Rotation in 45 Degrees Abduction AAROM with Dowel - 3 x daily - 7 x weekly - 10 reps - 1 sets Supine Shoulder Abduction AAROM with Dowel - 3 x daily - 7 x weekly - 10 reps - 1 sets Standing Shoulder Flexion Wall Walk - 3 x daily - 7 x weekly - 10 reps - 1 sets - 5 hold Supine Shoulder Flexion with Free Weight - 2 x daily - 7 x weekly - 3 sets - 10 reps Sidelying Shoulder Abduction Full Range of Motion with Dumbbell - 2 x daily - 7 x weekly - 3 sets - 10 reps Sidelying Shoulder ER with Towel and Dumbbell - 2 x daily - 7 x weekly - 3 sets - 10 reps Standing Row with Anchored Resistance - 2 x daily - 7 x weekly - 3 sets - 10 reps Shoulder Extension with Resistance - 2 x daily - 7 x weekly - 3 sets - 10 reps

## 2020-04-04 ENCOUNTER — Other Ambulatory Visit: Payer: Self-pay | Admitting: Family Medicine

## 2020-04-05 ENCOUNTER — Ambulatory Visit: Payer: Medicare PPO | Admitting: Physical Therapy

## 2020-04-05 ENCOUNTER — Encounter: Payer: Self-pay | Admitting: Family Medicine

## 2020-04-05 ENCOUNTER — Other Ambulatory Visit: Payer: Self-pay

## 2020-04-05 ENCOUNTER — Encounter: Payer: Self-pay | Admitting: Physical Therapy

## 2020-04-05 DIAGNOSIS — M25511 Pain in right shoulder: Secondary | ICD-10-CM | POA: Diagnosis not present

## 2020-04-05 DIAGNOSIS — M25611 Stiffness of right shoulder, not elsewhere classified: Secondary | ICD-10-CM | POA: Diagnosis not present

## 2020-04-05 DIAGNOSIS — Z1211 Encounter for screening for malignant neoplasm of colon: Secondary | ICD-10-CM | POA: Diagnosis not present

## 2020-04-05 DIAGNOSIS — M6281 Muscle weakness (generalized): Secondary | ICD-10-CM | POA: Diagnosis not present

## 2020-04-05 DIAGNOSIS — K5730 Diverticulosis of large intestine without perforation or abscess without bleeding: Secondary | ICD-10-CM | POA: Diagnosis not present

## 2020-04-05 DIAGNOSIS — Z86010 Personal history of colonic polyps: Secondary | ICD-10-CM | POA: Diagnosis not present

## 2020-04-05 NOTE — Assessment & Plan Note (Signed)
Chronic, improved with thyroid replacement.
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
## 2020-04-05 NOTE — Assessment & Plan Note (Addendum)
S/p ORIF 01/2020.  Occurred after fall at home off table - she states she was on table trying to reach her bird.  Appreciate ortho care - she is undergoing PT.

## 2020-04-05 NOTE — Assessment & Plan Note (Signed)
Chronic, stable on low dose levothyroxine - she notes this has significantly helped h/o chronic constipation.

## 2020-04-05 NOTE — Assessment & Plan Note (Addendum)
Update DEXA - last checked 2014. She continues calcium and vitamin D daily.

## 2020-04-05 NOTE — Assessment & Plan Note (Addendum)
Chronic, adequate for age, will remain off statin.  The 10-year ASCVD risk score Denman George DC Montez Hageman., et al., 2013) is: 15%   Values used to calculate the score:     Age: 75 years     Sex: Female     Is Non-Hispanic African American: No     Diabetic: No     Tobacco smoker: No     Systolic Blood Pressure: 124 mmHg     Is BP treated: No     HDL Cholesterol: 53.4 mg/dL     Total Cholesterol: 177 mg/dL

## 2020-04-05 NOTE — Assessment & Plan Note (Signed)
Chronic, stable period on paxil + hydroxyzine PRN anxiety.

## 2020-04-05 NOTE — Assessment & Plan Note (Addendum)
Chronic, stable. Reviewed avoiding NSAIDs and dark sodas and good hydration status.

## 2020-04-09 ENCOUNTER — Ambulatory Visit (INDEPENDENT_AMBULATORY_CARE_PROVIDER_SITE_OTHER): Payer: Medicare PPO | Admitting: Physical Therapy

## 2020-04-09 ENCOUNTER — Other Ambulatory Visit: Payer: Self-pay

## 2020-04-09 ENCOUNTER — Encounter: Payer: Self-pay | Admitting: Physical Therapy

## 2020-04-09 DIAGNOSIS — M25511 Pain in right shoulder: Secondary | ICD-10-CM

## 2020-04-09 DIAGNOSIS — M6281 Muscle weakness (generalized): Secondary | ICD-10-CM

## 2020-04-09 DIAGNOSIS — M25611 Stiffness of right shoulder, not elsewhere classified: Secondary | ICD-10-CM | POA: Diagnosis not present
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
## 2020-04-10 ENCOUNTER — Ambulatory Visit: Payer: Medicare PPO | Admitting: Orthopaedic Surgery

## 2020-04-11 ENCOUNTER — Ambulatory Visit (INDEPENDENT_AMBULATORY_CARE_PROVIDER_SITE_OTHER): Payer: Medicare PPO | Admitting: Physical Therapy

## 2020-04-11 ENCOUNTER — Encounter: Payer: Self-pay | Admitting: Physical Therapy

## 2020-04-11 ENCOUNTER — Other Ambulatory Visit: Payer: Self-pay

## 2020-04-11 DIAGNOSIS — M25611 Stiffness of right shoulder, not elsewhere classified: Secondary | ICD-10-CM

## 2020-04-11 DIAGNOSIS — M6281 Muscle weakness (generalized): Secondary | ICD-10-CM | POA: Diagnosis not present

## 2020-04-11 DIAGNOSIS — M25511 Pain in right shoulder: Secondary | ICD-10-CM | POA: Diagnosis not present
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
## 2020-04-16 ENCOUNTER — Encounter: Payer: Medicare PPO | Admitting: Physical Therapy

## 2020-04-18 ENCOUNTER — Telehealth: Payer: Self-pay | Admitting: Physical Therapy

## 2020-04-18 ENCOUNTER — Encounter: Payer: Medicare PPO | Admitting: Physical Therapy

## 2020-04-18 NOTE — Telephone Encounter (Signed)
LVM for pt as she NS for PT appt.  Last scheduled appt so requested she call back to reschedule if needed.  Laureen Abrahams, PT, DPT 04/18/20 12:03 PM

## 2020-04-20 ENCOUNTER — Ambulatory Visit: Payer: Medicare PPO | Admitting: Orthopaedic Surgery

## 2020-05-11 ENCOUNTER — Other Ambulatory Visit: Payer: Self-pay

## 2020-05-11 ENCOUNTER — Encounter: Payer: Self-pay | Admitting: Orthopaedic Surgery

## 2020-05-11 ENCOUNTER — Ambulatory Visit (INDEPENDENT_AMBULATORY_CARE_PROVIDER_SITE_OTHER): Payer: Medicare PPO

## 2020-05-11 ENCOUNTER — Ambulatory Visit: Payer: Medicare PPO | Admitting: Orthopaedic Surgery

## 2020-05-11 DIAGNOSIS — S42291A Other displaced fracture of upper end of right humerus, initial encounter for closed fracture: Secondary | ICD-10-CM
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
## 2020-05-18 ENCOUNTER — Telehealth: Payer: Self-pay

## 2020-05-18 NOTE — Telephone Encounter (Signed)
Spoke to pt. She said that it is not the levothyroxine after all because her sister is having the same issue. She will not stop the levothyroxine.

## 2020-05-18 NOTE — Telephone Encounter (Signed)
Pt left a message on triage VM stating she thought that levothyroxine was causing her to have episodes of diarrhea and was wanting to stop taking it.

## 2020-07-20 ENCOUNTER — Telehealth: Payer: Self-pay

## 2020-07-20 NOTE — Telephone Encounter (Signed)
Pt called to say she thought the hydroxyzine or paroxetine was causing her hands to shake. [facility] told her to call her PCP. While on the phone with her, I looked at drugs.com and found that paroxetine could cause tremors. She is going to cut them in half until she hears back from Dr G as to how she should wean off. She does not want to continue taking it.   She is aware that Dr Darnell Level is out of the office until Tuesday and will wait to hear back.

## 2020-07-24 NOTE — Addendum Note (Signed)
Addended by: Ria Bush on: 07/24/2020 07:46 AM   Modules accepted: Orders

## 2020-07-24 NOTE — Telephone Encounter (Signed)
Pt notified as instructed and pt voiced understanding. Pt wanted to know if she had to take the hydroxyzine every day; I advised pt the hydroxyzine is as needed only; pt does not have to take daily unless needed for anxiety. Pt voiced understanding and appreciative. Pt scheduled appt on 10/12/20 at 11 AM with Dr Darnell Level.

## 2020-07-24 NOTE — Telephone Encounter (Addendum)
She has done very well with paxil over the years I don't recommend fully coming off this.  Ok to try 10mg  daily - stay at this dose.  If she stops, she is at risk of recurrent mood trouble.  She also needs to schedule 6 mo f/u visit for July.

## 2020-07-24 NOTE — Telephone Encounter (Signed)
Left a message on voicemail for patient to call the office back. 

## 2020-07-30 ENCOUNTER — Telehealth: Payer: Self-pay

## 2020-07-30 NOTE — Telephone Encounter (Signed)
I spoke with pt; diarrhea is gone now and no problems at this time. Pt does not need anything at this time. Sending note to DR G as FYI.

## 2020-07-30 NOTE — Telephone Encounter (Signed)
[place] Night - Client TELEPHONE ADVICE RECORD [facility] Patient Name: [facility] [place] Gender: Female DOB: 20-Apr-1945 Age: 75 Y 26 M 20 D Return Phone Number: [phone] (Primary) Address: City/ State/ Zip: [place] [address] Client [place] Night - Client Client Site [place] Physician Ria Bush - MD Contact Type Call Who Is Calling Patient / Member / Family / Caregiver Call Type Triage / Clinical Relationship To Patient Self Return Phone Number [phone] (Primary) Chief Complaint Diarrhea Reason for Call Symptomatic / Request for Health Information Initial Comment Pt was put on a levothyroxine medication and is now having diarrhea. Pt has not been able to make it to the bathroom multiple times. Translation No Disp. Time [name] Time) Disposition Final User 07/27/2020 9:32:39 PM Attempt made - line busy Lynnette Caffey, RN, Amber 07/27/2020 9:44:45 PM Send To RN Personal Morris, RN, Museum/gallery conservator 07/27/2020 9:53:31 PM FINAL ATTEMPT MADE - no message left Lynnette Caffey, RN, [employer] 07/27/2020 9:53:38 PM Send to RN Final Attempt Juanna Cao, RN, Amber 07/27/2020 10:56:52 PM FINAL ATTEMPT MADE - message left Yes Tooten, RN, Eustace Pen Comments User: Lysbeth Penner Date/Time (Eastern Time): 07/27/2020 9:49:13 PM Listened to the call for the nurse. The number listed is the one that was given.

## 2020-08-02 ENCOUNTER — Telehealth: Payer: Self-pay | Admitting: Family Medicine

## 2020-08-02 DIAGNOSIS — E039 Hypothyroidism, unspecified: Secondary | ICD-10-CM

## 2020-08-02 DIAGNOSIS — R197 Diarrhea, unspecified: Secondary | ICD-10-CM

## 2020-08-02 IMAGING — CT CT ANGIO CHEST
2 of 6 series · 18 of 46 positions shown · IV contrast (APPLIED)
Comparison: Same-day radiograph

CLINICAL DATA: Shortness of breath, high lactate, green phlegm,
chest x-ray negative

EXAM:
CT ANGIOGRAPHY CHEST WITH CONTRAST
TECHNIQUE: Multidetector CT imaging of the chest was performed using the
standard protocol during bolus administration of intravenous
contrast. Multiplanar CT image reconstructions and MIPs were
obtained to evaluate the vascular anatomy.
CONTRAST:  60mL OMNIPAQUE IOHEXOL 350 MG/ML SOLN

[Series 5: thins · axial · 0.66mm/px · z∈[-546,-313]mm · 15 of 257 slices shown]
[im 12/257  lung]
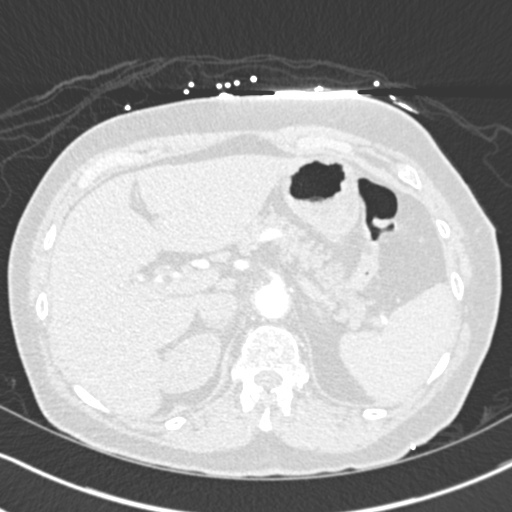
[im 34/257  soft-tissue]
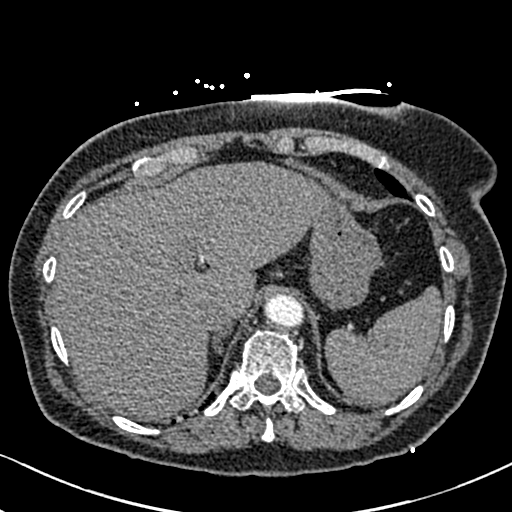
[im 45/257  lung]
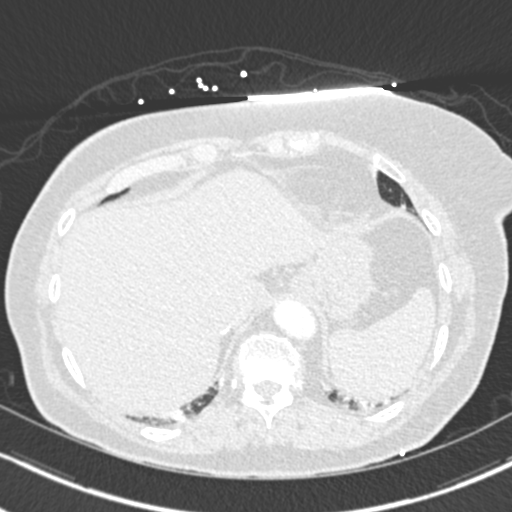
[im 67/257  soft-tissue]
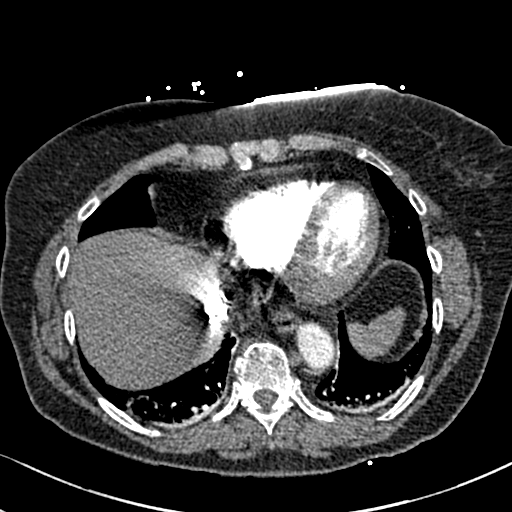
[im 78/257  lung]
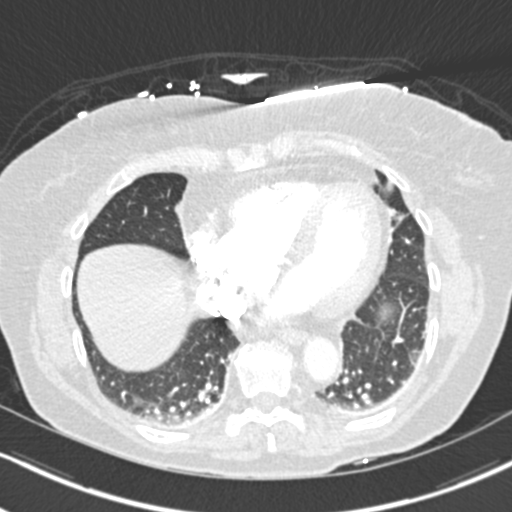
[im 101/257  soft-tissue]
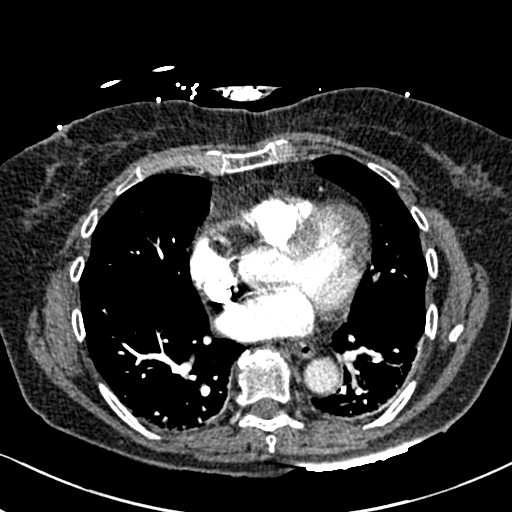
[im 112/257  lung]
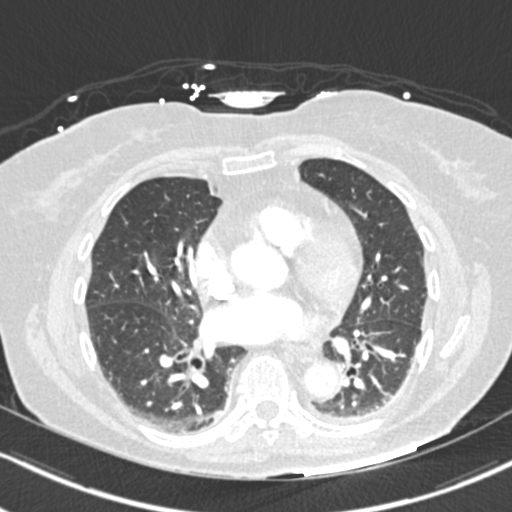
[im 134/257  soft-tissue]
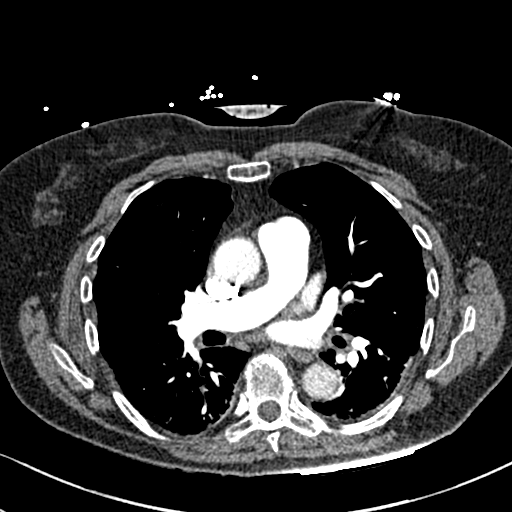
[im 145/257  lung]
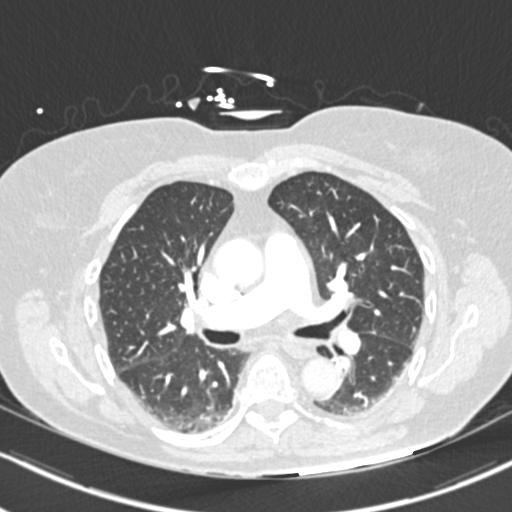
[im 156/257  soft-tissue]
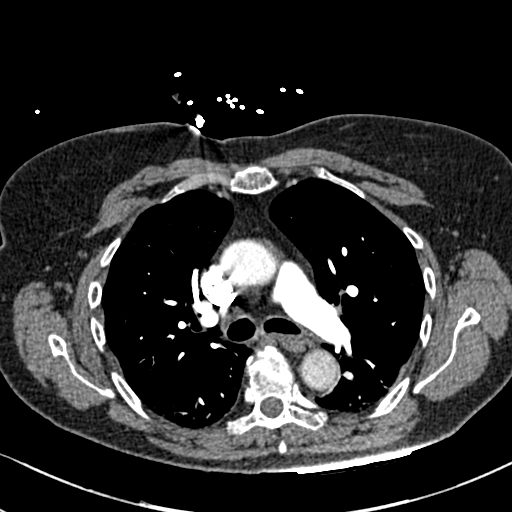
[im 179/257  lung]
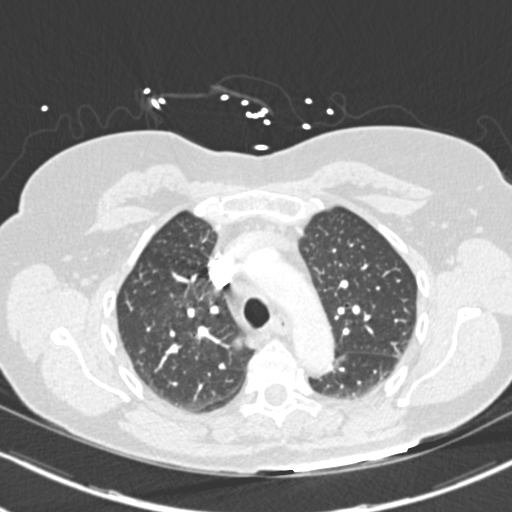
[im 190/257  soft-tissue]
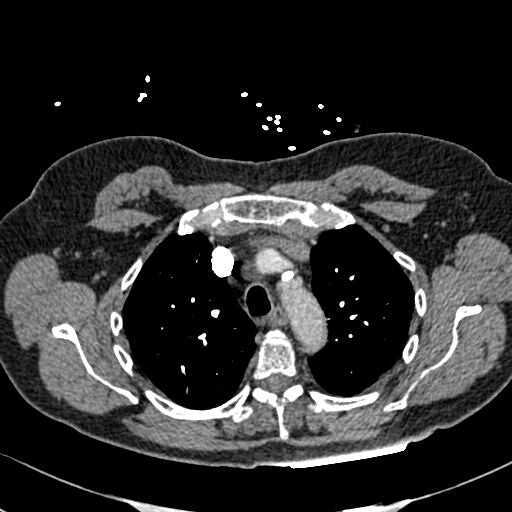
[im 212/257  lung]
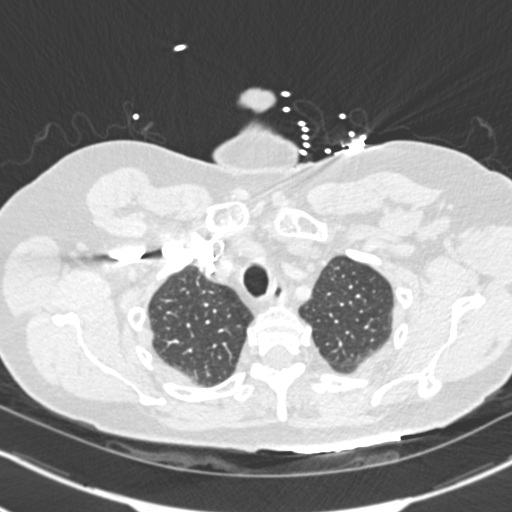
[im 223/257  soft-tissue]
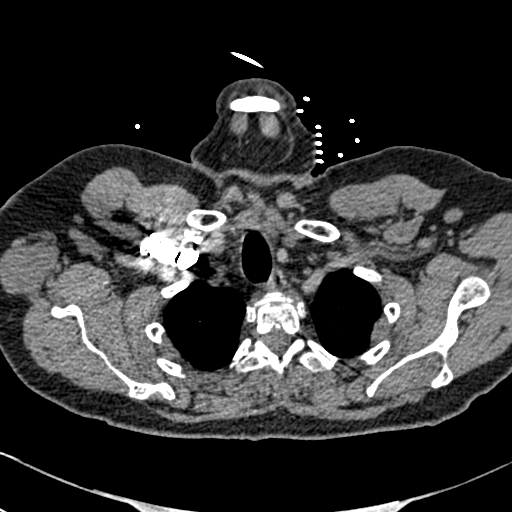
[im 245/257  lung]
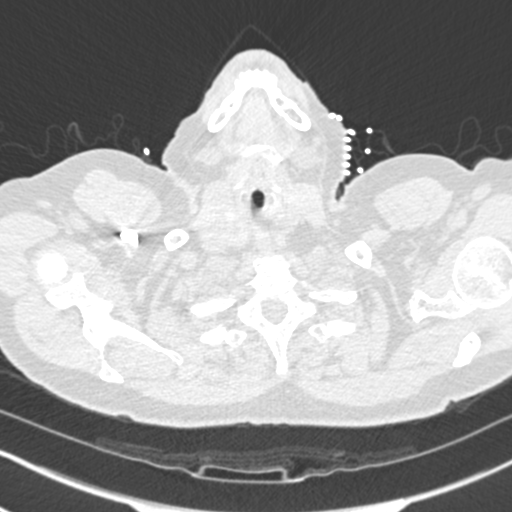

[Series 7: coronal mpr · coronal · 0.52mm/px · 3 of 88 slices shown]
[im 22/88  soft-tissue]
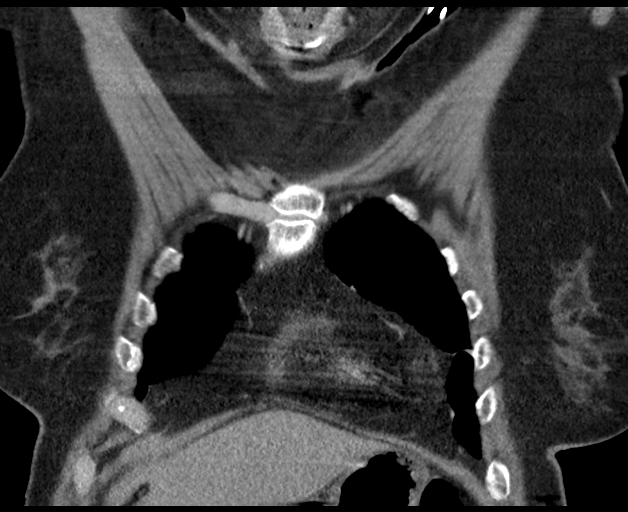
[im 44/88  soft-tissue]
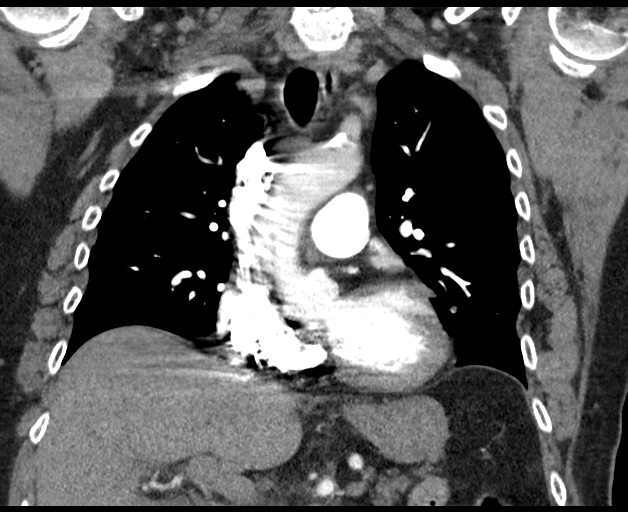
[im 66/88  soft-tissue]
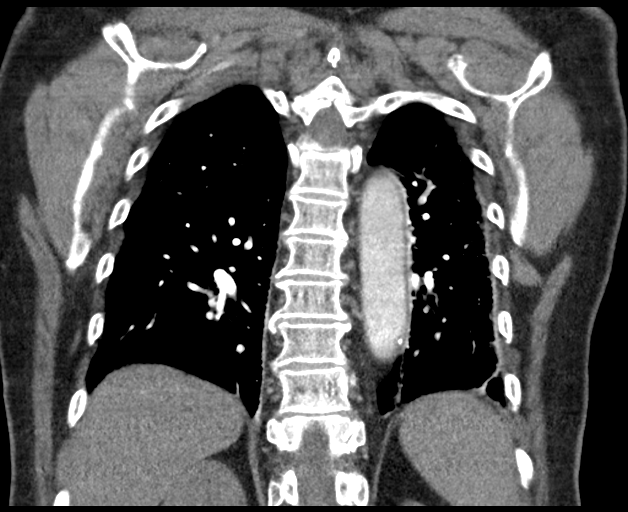

[18 of 46 positions shown; findings below may reference images not displayed]

FINDINGS: Cardiovascular: Satisfactory opacification of the pulmonary arteries
to the segmental level. No evidence of pulmonary embolism or
pulmonary artery enlargement. Normal heart size. No pericardial
effusion. Atherosclerotic plaque within the normal caliber aorta.
Shared origin of the brachiocephalic and left common carotid artery.
Minimal plaque within the proximal great vessels. Major venous
structures are grossly unremarkable.

Mediastinum/Nodes: No enlarged mediastinal or axillary lymph nodes.
Thyroid gland, trachea, and esophagus demonstrate no significant
findings.

Lungs/Pleura: Mild airways thickening. Dependent atelectasis seen
posteriorly. More bandlike areas of opacity likely reflect
additional subsegmental atelectasis or scarring. No consolidation,
features of edema, pneumothorax, or effusion. No suspicious
pulmonary nodules or masses.

Upper Abdomen: No acute abnormalities present in the visualized
portions of the upper abdomen. 11 cm fluid attenuation probable
hepatic cyst in the right lobe liver (4/74).

Musculoskeletal: Multilevel degenerative changes are present in the
imaged portions of the spine. Exaggerated thoracic kyphosis. No
acute or suspicious osseous lesions.

Review of the MIP images confirms the above findings.
IMPRESSION: No evidence of pulmonary embolism to the segmental level.

Mild diffuse airways thickening may reflect either acute or chronic
bronchitic change. No consolidative process seen.

Aortic Atherosclerosis ([id number]-UE8.8).

## 2020-08-02 NOTE — Telephone Encounter (Signed)
Karen [name] called and wanted to let Dr. Darnell Level know that the thyroid medication (Levothyroxine) is making her use the bathroom on herself, and once she takes it its like a faucet is turned on and she cant stop and it catches her off guard and she hold it. And it has happened to her twice

## 2020-08-02 NOTE — Telephone Encounter (Signed)
[place] Night - Client TELEPHONE ADVICE RECORD [facility] Patient Name: [facility] [place] Gender: Female DOB: Nov 21, 1945 Age: 75 Y 15 M 25 D Return Phone Number: [phone] (Primary) Address: City/ State/ Zip: [place] [address] Client [place] Night - Client Client Site [place] Physician Ria Bush - MD Contact Type Call Who Is Calling Patient / Member / Family / Caregiver Call Type Triage / Clinical Relationship To Patient Self Return Phone Number [phone] (Primary) Chief Complaint Diarrhea Reason for Call Symptomatic / Request for Health Information Initial Comment caller states she has been on thyroid medication and now is having some incontinence with bowels. She is having stools thats runny. She wants to notify doctor that medication may be too strong. Translation No Nurse Assessment Nurse: Marlowe Sax, RN, Tannia Date/Time (Eastern Time): 08/01/2020 7:52:38 PM Confirm and document reason for call. If symptomatic, describe symptoms. ---caller states she has been on thyroid medication and now is having some incontinence with bowels. She wants to notify doctor that medication may be too strong. levothyroxine 50 mg Does the patient have any new or worsening symptoms? ---Yes Will a triage be completed? ---Yes Related visit to physician within the last 2 weeks? ---N/A Does the PT have any chronic conditions? (i.e. diabetes, asthma, this includes High risk factors for pregnancy, etc.) ---No Is this a behavioral health or substance abuse call? ---No Guidelines Guideline Title Affirmed Question Affirmed Notes Nurse Date/Time (Eastern Time) Diarrhea [1] MODERATE diarrhea (e.g., 4-6 times / day more than normal) AND [2] age > 84 years Leticia Clas, Reed Breech 08/01/2020 7:56:06 PM Disp. Time [name] Time) Disposition Final User 08/01/2020 7:45:19 PM Send to Clinical Melina Copa,  RN, Haynes Dage PLEASE NOTE: All timestamps contained within this report are represented as [place] Standard Time. CONFIDENTIALTY NOTICE: This fax transmission is intended only for the addressee. It contains information that is legally privileged, confidential or otherwise protected from use or disclosure. If you are not the intended recipient, you are strictly prohibited from reviewing, disclosing, copying using or disseminating any of this information or taking any action in reliance on or regarding this information. If you have received this fax in error, please notify us immediately by telephone so that we can arrange for its return to [place]. Phone: [phone], Toll-Free: [phone], Fax: [phone] Page: 2 of 2 Call Id: [id number] 08/01/2020 8:13:31 PM See PCP within 24 Hours Yes Marlowe Sax, RN, Reed Breech Caller Disagree/Comply Comply Caller Understands Yes PreDisposition Home Care Care Advice Given Per Guideline SEE PCP WITHIN 24 HOURS: * IF OFFICE WILL BE OPEN: You need to be examined within the next 24 hours. Call your doctor (or NP/PA) when the office opens and make an appointment. * Drink more fluids, at least 8 to 10 cups daily. One cup equals 8 oz (240 ml). CALL BACK IF: * Signs of dehydration occur (e.g., no urine over 12 hours, very dry mouth, lightheaded, etc.) * Bloody stools * Constant or severe abdomen pain * You become worse CARE ADVICE given per Diarrhea (Adult) guideline. Comments User: Orvilla Cornwall, RN Date/Time [name] Time): 08/01/2020 7:55:07 PM unsure of when she last saw her dr Referrals REFERRED TO PCP OFFICE

## 2020-08-02 NOTE — Telephone Encounter (Signed)
Spoke to pt. Made lab appt for 08-03-20 at 10am.

## 2020-08-02 NOTE — Telephone Encounter (Signed)
She has appt with me next week - would offer she come in for thyroid labs this week and we can review results at [facility].  Labs ordered.

## 2020-08-03 ENCOUNTER — Other Ambulatory Visit: Payer: Medicare PPO

## 2020-08-07 ENCOUNTER — Encounter: Payer: Self-pay | Admitting: Family Medicine

## 2020-08-07 ENCOUNTER — Other Ambulatory Visit: Payer: Self-pay

## 2020-08-07 ENCOUNTER — Telehealth: Payer: Self-pay

## 2020-08-07 ENCOUNTER — Ambulatory Visit: Payer: Medicare PPO | Admitting: Family Medicine

## 2020-08-07 DIAGNOSIS — R159 Full incontinence of feces: Secondary | ICD-10-CM

## 2020-08-07 DIAGNOSIS — R251 Tremor, unspecified: Secondary | ICD-10-CM | POA: Diagnosis not present

## 2020-08-07 DIAGNOSIS — R152 Fecal urgency: Secondary | ICD-10-CM

## 2020-08-07 DIAGNOSIS — E039 Hypothyroidism, unspecified: Secondary | ICD-10-CM | POA: Diagnosis not present

## 2020-08-07 DIAGNOSIS — F4323 Adjustment disorder with mixed anxiety and depressed mood: Secondary | ICD-10-CM

## 2020-08-07 NOTE — Patient Instructions (Addendum)
Continue paxil 1/2 dose daily for 1 month then ok to try off paxil, but monitor for recurrent mood trouble and if that happens, restart and let us know.  Continue hydroxyzine as needed for anxiety.  If bowel issues recur, let me know. We will continue thyroid medicine (levothyroxine) at current dose.

## 2020-08-07 NOTE — Telephone Encounter (Signed)
Noted  

## 2020-08-07 NOTE — Progress Notes (Signed)
Patient ID: Karen [name], female    DOB: 1945-05-05, 75 y.o.   MRN: [id number]  This visit was conducted in person.  BP 128/82   Pulse 66   Temp 98.4 F (36.9 C) (Temporal)   Ht 5\' 3"  (1.6 m)   Wt 162 lb 9 oz (73.7 kg)   LMP  (LMP Unknown)   SpO2 96%   BMI 28.80 kg/m    CC: discuss meds  Subjective:   HPI: Karen [name] is a 75 y.o. female presenting on 08/07/2020 for Discuss Medication (Wants to discuss levothyroxine.  )   Tremors - she was worried it was from paxil so we decreased dose to 10mg  daily - tremors have improved. On paxil for adjustment disorder after husband died in car crash. She would like to fully stop medicine.   On hydroxyzine 10-20mg  BID PRN anxiety. Takes one a day, never needs more than this.   Hypothyroidism - levothyroxine significantly helped constipation. However she is worried it caused 2 episodes of bowel incontinence.  No bladder incontinence trouble.     Relevant past medical, surgical, family and social history reviewed and updated as indicated. Interim medical history since our last visit reviewed. Allergies and medications reviewed and updated. Outpatient Medications Prior to Visit  Medication Sig Dispense Refill  . acetaminophen (TYLENOL) 500 MG tablet Take 1 tablet (500 mg total) by mouth every 6 (six) hours as needed.    . calcium-vitamin D (OSCAL WITH D) 500-200 MG-UNIT tablet Take 1 tablet by mouth 3 (three) times daily. 90 tablet 6  . Cholecalciferol (VITAMIN D3) 50 MCG (2000 UT) TABS Take 2,000 Units by mouth daily.    Karen [name] (IMMUNE HEALTH BLEND PO) Take 1 tablet by mouth daily. Immunity Formula by Karen [name]    . hydrOXYzine (ATARAX/VISTARIL) 10 MG tablet TAKE 1-2 TABLETS (10-20 MG TOTAL) BY MOUTH 2 (TWO) TIMES DAILY AS NEEDED FOR ANXIETY. 30 tablet 5  . levothyroxine (SYNTHROID) 50 MCG tablet Take 1 tablet (50 mcg total) by mouth daily. 90 tablet 3  . methocarbamol (ROBAXIN) 500 MG tablet Take 1 tablet (500 mg total) by  mouth every 6 (six) hours as needed for muscle spasms. 30 tablet 2  . Multiple Vitamin (MULTIVITAMIN WITH MINERALS) TABS tablet Take 1 tablet by mouth daily. Vita-Lea Women Multivitamn    . PARoxetine (PAXIL) 20 MG tablet Take 0.5 tablets (10 mg total) by mouth daily.    Marland Kitchen triamcinolone cream (KENALOG) 0.1 % Apply 1 application topically 2 (two) times daily. Apply to AA on trunk and extremities for poison oak, do not use on face underarms or groin 45 g 0  . vitamin E 180 MG (400 UNITS) capsule Take 400 Units by mouth daily.    . hydrocortisone (ANUSOL-HC) 25 MG suppository UNWRAP & INSERT 1 SUPPOSITORY RECTALLY 2 TIMES DAILY AS NEEDED FOR HEMORRHOIDS (USE SPARINGLY) (Patient taking differently: Place 25 mg rectally 2 (two) times daily as needed for hemorrhoids. UNWRAP & INSERT 1 SUPPOSITORY RECTALLY 2 TIMES DAILY AS NEEDED FOR HEMORRHOIDS (USE SPARINGLY)) 12 suppository 0   No facility-administered medications prior to visit.     Per HPI unless specifically indicated in ROS section below Review of Systems Objective:  BP 128/82   Pulse 66   Temp 98.4 F (36.9 C) (Temporal)   Ht 5\' 3"  (1.6 m)   Wt 162 lb 9 oz (73.7 kg)   LMP  (LMP Unknown)   SpO2 96%   BMI 28.80 kg/m   Wt Readings from  Last 3 Encounters:  08/07/20 162 lb 9 oz (73.7 kg)  04/02/20 160 lb (72.6 kg)  02/09/20 164 lb (74.4 kg)      Physical Exam Vitals and nursing note reviewed.  Constitutional:      Appearance: Normal appearance. She is not ill-appearing.  HENT:     Head: Normocephalic and atraumatic.  Cardiovascular:     Rate and Rhythm: Normal rate and regular rhythm.     Pulses: Normal pulses.     Heart sounds: Normal heart sounds. No murmur heard.   Pulmonary:     Effort: Pulmonary effort is normal. No respiratory distress.     Breath sounds: Normal breath sounds. No wheezing, rhonchi or rales.  Skin:    General: Skin is warm and dry.     Findings: No rash.  Neurological:     Mental Status: She is alert.      Comments: No significant tremor appreciated  Psychiatric:        Mood and Affect: Mood normal.        Behavior: Behavior normal.       Lab Results  Component Value Date   CREATININE 1.06 03/28/2020   BUN 19 03/28/2020   NA 142 03/28/2020   K 3.7 03/28/2020   CL 106 03/28/2020   CO2 27 03/28/2020   Depression screen PHQ 2/9 04/02/2020 03/28/2020 03/16/2019 03/12/2018 03/12/2018  Decreased Interest 0 0 0 0 0  Down, Depressed, Hopeless 0 0 0 0 -  PHQ - 2 Score 0 0 0 0 0  Altered sleeping 0 0 1 - -  Tired, decreased energy 0 0 0 - -  Change in appetite 0 0 0 - -  Feeling bad or failure about yourself  0 0 0 - -  Trouble concentrating 0 0 0 - -  Moving slowly or fidgety/restless 0 0 0 - -  Suicidal thoughts 0 0 0 - -  PHQ-9 Score 0 0 1 - -  Difficult doing work/chores Not difficult at all Not difficult at all - - -  Some recent data might be hidden    GAD 7 : Generalized Anxiety Score 03/16/2019  Nervous, Anxious, on Edge 0  Control/stop worrying 0  Worry too much - different things 0  Trouble relaxing 0  Restless 0  Easily annoyed or irritable 0  Afraid - awful might happen 0  Total GAD 7 Score 0   Assessment & Plan:  This visit occurred during the SARS-CoV-2 public health emergency.  Safety protocols were in place, including screening questions prior to the visit, additional usage of staff PPE, and extensive cleaning of exam room while observing appropriate contact time as indicated for disinfecting solutions.   Problem List Items Addressed This Visit    Adjustment disorder with mixed anxiety and depressed mood    Reasonable to slowly transition off paxil - given duration of use (started ~2014), recommend at least 1 month of lower 10mg  dose (1/2 tab) then consider trial off. Discussed risk of relapse once off med - to monitor mood closely and restart paxil if this happens. [name] continue hydroxyzine PRN anxiety.       Tremor of both hands    This seems to have  improved on lower paxil dose - reasonable to trial off SSRI - see above.       Hypothyroidism (acquired)    Stable period on low dose levothyroxine.  Lifelong trouble with constipation has essentially resolved. However now noticing rare episodes of stool urgency  leading to incontinence - worried thyroid replacement causing this. [name] continue for now, but let me know if recurrent bowel issues.       Bowel incontinence    ?levothyroxine related - [name] monitor for now and let me know if recurrent issues.           No orders of the defined types were placed in this encounter.  No orders of the defined types were placed in this encounter.   Patient Instructions  Continue paxil 1/2 dose daily for 1 month then ok to try off paxil, but monitor for recurrent mood trouble and if that happens, restart and let us know.  Continue hydroxyzine as needed for anxiety.  If bowel issues recur, let me know. We [name] continue thyroid medicine (levothyroxine) at current dose.    Follow up plan: Return if symptoms worsen or fail to improve.  Ria Bush, MD

## 2020-08-07 NOTE — Telephone Encounter (Signed)
[place] Night - Client Nonclinical Telephone Record AccessNurse Client [place] Night - Client Client Site [place] Physician Ria Bush - MD Contact Type Call Who Is Calling Patient / Member / Family / Caregiver Caller Name Giles Phone Number [phone] Patient Name Karen [name] Patient DOB March 27, 1946 Call Type Message Only Information Provided Reason for Call Request for General Office Information Initial Comment Caller States she is calling to let the office know she will be at her appointment tomorrow. Disp. Time Disposition Final User 08/06/2020 5:51:24 PM General Information Provided Yes Sharlet Salina Call Closed By: Sharlet Salina Transaction Date/Time: 08/06/2020 5:49:27 PM (ET)

## 2020-08-10 ENCOUNTER — Other Ambulatory Visit: Payer: Self-pay

## 2020-08-10 MED ORDER — HYDROCORTISONE ACETATE 25 MG RE SUPP: RECTAL | 0 refills | Status: DC

## 2020-08-10 NOTE — Telephone Encounter (Signed)
Pt left another v/m this time asking for refill of suppositories for hemorrhoids to [facility].

## 2020-08-10 NOTE — Telephone Encounter (Signed)
Pt left v/m that she is out of the hemorrhoid suppositories; pt only has to use occasionally but if Dr Darnell Level wants pt to have on hand needs refill to [facility]. Pt last seen 08/07/20.

## 2020-08-10 NOTE — Telephone Encounter (Signed)
Pt notified that refill had been done and sent electronically to [facility]. Pt voiced understanding and appreciative. Pt said she did not understand why refill had not been done since she called this morning. I explained that Dr Darnell Level has been seeing pts all day and there is a refill window of 48 - 72 hours. Pt appreciative of refill being done today. Nothing further needed.

## 2020-08-10 NOTE — Telephone Encounter (Signed)
Pt has left another message about wanting cb about refill of anusol HC suppositories; pt said she is out of med and wants refill done today due to pt having hemorrhoids. Pt also request cb. Pt immediately left another v/m with same info. Sending note to Dr Darnell Level.

## 2020-08-10 NOTE — Telephone Encounter (Signed)
ERx 

## 2020-08-11 DIAGNOSIS — R159 Full incontinence of feces: Secondary | ICD-10-CM | POA: Insufficient documentation

## 2020-08-11 NOTE — Assessment & Plan Note (Signed)
?  levothyroxine related - will monitor for now and let me know if recurrent issues.

## 2020-08-11 NOTE — Assessment & Plan Note (Addendum)
Reasonable to slowly transition off paxil - given duration of use (started ~2014), recommend at least 1 month of lower 10mg  dose (1/2 tab) then consider trial off. Discussed risk of relapse once off med - to monitor mood closely and restart paxil if this happens. Will continue hydroxyzine PRN anxiety.

## 2020-08-11 NOTE — Assessment & Plan Note (Signed)
Stable period on low dose levothyroxine.  Lifelong trouble with constipation has essentially resolved. However now noticing rare episodes of stool urgency leading to incontinence - worried thyroid replacement causing this. Will continue for now, but let me know if recurrent bowel issues.

## 2020-08-11 NOTE — Assessment & Plan Note (Signed)
This seems to have improved on lower paxil dose - reasonable to trial off SSRI - see above.

## 2020-08-15 ENCOUNTER — Telehealth: Payer: Self-pay

## 2020-08-15 NOTE — Telephone Encounter (Signed)
Pt said the hydrocortisone suppositories given # 12 on 08/10/20 are gone. Pt said the suppository helps for short period with internal and external hemorrhoids. Pt wants Dr Bosie Clos opinion if needs surgical consult; pt said she does not really want surgery because she understands it is painful but Nanami wants the hemorrhoids gone. Pt said she has been constipated since she was a child. [facility]. Pt request cb after note reviewed by Dr Danise Mina.

## 2020-08-16 NOTE — Telephone Encounter (Signed)
Recommend OV with me or available provider for evaluation then can direct management based on exam.

## 2020-08-17 NOTE — Telephone Encounter (Signed)
Plz schedule OV for hemorrhoids. (see Dr. Synthia Innocent message below)

## 2020-08-21 ENCOUNTER — Other Ambulatory Visit: Payer: Self-pay

## 2020-08-21 ENCOUNTER — Ambulatory Visit: Payer: Medicare PPO | Admitting: Family Medicine

## 2020-08-21 ENCOUNTER — Encounter: Payer: Self-pay | Admitting: Family Medicine

## 2020-08-21 VITALS — BP 122/78 | HR 56 | Temp 98.3°F | Ht 63.0 in | Wt 163.5 lb

## 2020-08-21 DIAGNOSIS — Z1211 Encounter for screening for malignant neoplasm of colon: Secondary | ICD-10-CM | POA: Diagnosis not present

## 2020-08-21 DIAGNOSIS — K623 Rectal prolapse: Secondary | ICD-10-CM | POA: Diagnosis not present

## 2020-08-21 NOTE — Patient Instructions (Signed)
I think you have some recatl prolapse - we will refer you to GI for further evaluation as well as for colonoscopy as you're due.   Rectal Prolapse, Adult Rectal prolapse happens when the inside of the last section of the large intestine (rectum) drops down into an abnormal position. There are two types of rectal prolapse:  Partial. In partial rectal prolapse, the inner lining of the rectum falls or sinks out of place and may stick out of the anus.  Complete. In complete rectal prolapse, all layers of the rectum fall or sink out of place and may stick out of the anus. At first, rectal prolapse may be temporary. It may happen only when you are having a bowel movement. Over time, the prolapse will likely get worse. It may start to happen more often and cause uncomfortable symptoms. Eventually, the prolapse may happen when you are walking or standing. Surgery is often needed for this condition. What are the causes? This condition may be caused by weakness in the muscles that attach the rectum to the inside of the lower abdomen. The exact cause of this muscle weakness is often not known. What increases the risk? You are more likely to develop this condition if you:  Have a history of chronic constipation or diarrhea.  Frequently strain to have a bowel movement.  Are a woman who is 76 years of age or older.  Have a neurological disorder, such as multiple sclerosis, or lower spinal cord injury.  Are a woman who has been pregnant many times.  Have had pelvic or rectal surgery.  Have cystic fibrosis. What are the signs or symptoms? The main symptom of this condition is a red mass of tissue sticking out of your anus. The mass may appear inflamed, have mucus, or bleed slightly.  At first, the mass may only appear after a bowel movement.  The mass may then start to appear more often. Other symptoms may include:  Discomfort in the anus and rectum.  Constipation.  Feeling that stool is not  completely emptied from the rectum.  Diarrhea.  Leaking of stool, mucus, or blood from the anus (fecal incontinence).  Feeling like you are sitting on a ball. How is this diagnosed? This condition may be diagnosed based on your symptoms and a physical exam.  During the exam, you may be asked to squat and strain as though you are having a bowel movement.  You may also have tests, such as: ? A rectal exam using a flexible scope (sigmoidoscopy or colonoscopy). ? A procedure that involves taking X-rays of your rectum after a dye (contrast material) is injected into the rectum (defecogram). How is this treated? This condition is usually treated with surgery to repair the weakened muscles and to reconnect the rectum to attachments inside the lower abdomen. Other treatment options may include:  Pushing the prolapsed area back into the rectum (reduction). ? Your health care provider may do this by gently pushing it back in using a moist cloth. ? The health care provider may show you how to do this at home if the prolapse occurs again.  Medicines to prevent constipation and straining. This may include laxatives or stool softeners. Follow these instructions at home: General instructions  Take over-the-counter and prescription medicines only as told by your health care provider.  Do not strain to have a bowel movement.  Do not lift anything that is heavier than 10 lb (4.5 kg), or the limit that you are told, until  your health care provider says that it is safe.  Follow instructions from your health care provider about what to do if the prolapse occurs again and does not go back in. This may involve lying on your side and using a moist cloth to gently press the lump into your rectum.  Keep all follow-up visits as told by your health care provider. This is important.   Preventing constipation To prevent or treat constipation, your health care provider may recommend that you:  Drink enough  fluid to keep your urine pale yellow.  Take over-the-counter or prescription medicines.  Eat foods that are high in fiber, such as beans, whole grains, and fresh fruits and vegetables.  Limit foods that are high in fat and processed sugars, such as fried or sweet foods.   Contact a health care provider if:  You have a fever.  Your prolapse cannot be reduced at home.  You have constipation or diarrhea.  You have mild rectal bleeding. Get help right away if:  You have very bad rectal pain.  You bleed heavily from your rectum. Summary  Rectal prolapse happens when the inside of the rectum drops down into an abnormal position. In some cases, the rectum may partially or completely push out through the anal opening.  This condition is usually treated with surgery to repair the weakened muscles and to reconnect the rectum to attachments inside the lower abdomen.  Take over-the-counter and prescription medicines only as told by your health care provider.  Follow instructions from your health care provider about what to do if the prolapse occurs again and does not go back in.  Get help right away if you have very bad rectal pain or if you bleed heavily from your rectum. This information is not intended to replace advice given to you by your health care provider. Make sure you discuss any questions you have with your health care provider. Document Revised: 09/23/2017 Document Reviewed: 09/23/2017 Elsevier Patient Education  [place].

## 2020-08-21 NOTE — Assessment & Plan Note (Signed)
Exam suspicious for this.  Discussed GI vs gen surg eval - will start with GI as she's overdue for colonoscopy.  Handout provided.

## 2020-08-21 NOTE — Progress Notes (Signed)
Patient ID: Karen [name], female    DOB: 1946/02/15, 75 y.o.   MRN: [id number]  This visit was conducted in person.  BP 122/78   Pulse (!) 56   Temp 98.3 F (36.8 C) (Temporal)   Ht 5\' 3"  (1.6 m)   Wt 163 lb 8 oz (74.2 kg)   LMP  (LMP Unknown)   SpO2 95%   BMI 28.96 kg/m    CC: hemorrhoids  Subjective:   HPI: Karen [name] is a 75 y.o. female presenting on 08/21/2020 for Hemorrhoids (C/o hemorrhoids.  States suppositories work for [employer] but they keep coming back.)   Longstanding h/o hemorrhoids previously managed with PRN anusol suppositories. No pain, itching. Rare bleeding. Only notes excess tissue when wiping.  Chronic constipation.  No bowel incontinence.  Constipation some better since starting thyroid replacement.   COLONOSCOPY 12/2014 - TA, diverticulosis, ext and int hemorrhoids, rpt 5 yrs ([name])     Relevant past medical, surgical, family and social history reviewed and updated as indicated. Interim medical history since our last visit reviewed. Allergies and medications reviewed and updated. Outpatient Medications Prior to Visit  Medication Sig Dispense Refill  . acetaminophen (TYLENOL) 500 MG tablet Take 1 tablet (500 mg total) by mouth every 6 (six) hours as needed.    . calcium-vitamin D (OSCAL WITH D) 500-200 MG-UNIT tablet Take 1 tablet by mouth 3 (three) times daily. 90 tablet 6  . Cholecalciferol (VITAMIN D3) 50 MCG (2000 UT) TABS Take 2,000 Units by mouth daily.    . hydrocortisone (ANUSOL-HC) 25 MG suppository UNWRAP & INSERT 1 SUPPOSITORY RECTALLY 2 TIMES DAILY AS NEEDED FOR HEMORRHOIDS (USE SPARINGLY) 12 suppository 0  . hydrOXYzine (ATARAX/VISTARIL) 10 MG tablet TAKE 1-2 TABLETS (10-20 MG TOTAL) BY MOUTH 2 (TWO) TIMES DAILY AS NEEDED FOR ANXIETY. 30 tablet 5  . levothyroxine (SYNTHROID) 50 MCG tablet Take 1 tablet (50 mcg total) by mouth daily. 90 tablet 3  . Multiple Vitamin (MULTIVITAMIN WITH MINERALS) TABS tablet Take 1 tablet by mouth daily.  Vita-Lea Women Multivitamn    . PARoxetine (PAXIL) 20 MG tablet Take 0.5 tablets (10 mg total) by mouth daily.    . vitamin E 180 MG (400 UNITS) capsule Take 400 Units by mouth daily.    Sherrine Maples (IMMUNE HEALTH BLEND PO) Take 1 tablet by mouth daily. Immunity Formula by Guinevere Scarlet    . methocarbamol (ROBAXIN) 500 MG tablet Take 1 tablet (500 mg total) by mouth every 6 (six) hours as needed for muscle spasms. 30 tablet 2  . triamcinolone cream (KENALOG) 0.1 % Apply 1 application topically 2 (two) times daily. Apply to AA on trunk and extremities for poison oak, do not use on face underarms or groin 45 g 0   No facility-administered medications prior to visit.     Per HPI unless specifically indicated in ROS section below Review of Systems Objective:  BP 122/78   Pulse (!) 56   Temp 98.3 F (36.8 C) (Temporal)   Ht 5\' 3"  (1.6 m)   Wt 163 lb 8 oz (74.2 kg)   LMP  (LMP Unknown)   SpO2 95%   BMI 28.96 kg/m   Wt Readings from Last 3 Encounters:  08/21/20 163 lb 8 oz (74.2 kg)  08/07/20 162 lb 9 oz (73.7 kg)  04/02/20 160 lb (72.6 kg)      Physical Exam Vitals and nursing note reviewed.  Constitutional:      Appearance: Normal appearance. She is not ill-appearing.  Genitourinary:    Rectum: Mass present. No tenderness or external hemorrhoid.     Comments: Circumferential excess tissue protruding from anus suggesting degree of rectal prolapse Neurological:     Mental Status: She is alert.       Lab Results  Component Value Date   TSH 2.47 03/28/2020   Assessment & Plan:  This visit occurred during the SARS-CoV-2 public health emergency.  Safety protocols were in place, including screening questions prior to the visit, additional usage of staff PPE, and extensive cleaning of exam room while observing appropriate contact time as indicated for disinfecting solutions.   Problem List Items Addressed This Visit    Rectal prolapse - Primary    Exam suspicious for this.   Discussed GI vs gen surg eval - will start with GI as she's overdue for colonoscopy.  Handout provided.       Relevant Orders   Ambulatory referral to Gastroenterology    Other Visit Diagnoses    Special screening for malignant neoplasms, colon       Relevant Orders   Ambulatory referral to Gastroenterology       No orders of the defined types were placed in this encounter.  Orders Placed This Encounter  Procedures  . Ambulatory referral to Gastroenterology    Referral Priority:   Routine    Referral Type:   Consultation    Referral Reason:   Specialty Services Required    Number of Visits Requested:   1    Patient instructions: I think you have some rectal prolapse - we will refer you to GI for further evaluation as well as for colonoscopy as you're due.   Follow up plan: Return if symptoms worsen or fail to improve.  Ria Bush, MD

## 2020-08-22 ENCOUNTER — Other Ambulatory Visit: Payer: Self-pay | Admitting: Family Medicine

## 2020-08-24 ENCOUNTER — Telehealth: Payer: Self-pay | Admitting: Family Medicine

## 2020-08-24 DIAGNOSIS — Z1211 Encounter for screening for malignant neoplasm of colon: Secondary | ICD-10-CM

## 2020-08-24 DIAGNOSIS — K623 Rectal prolapse: Secondary | ICD-10-CM

## 2020-08-24 NOTE — Telephone Encounter (Signed)
Recommend referral to [facility]. Thank you.

## 2020-08-24 NOTE — Telephone Encounter (Signed)
Spoke with Lattie Haw from [facility] call and stated they receive a referral and are not able to see this patient

## 2020-08-24 NOTE — Telephone Encounter (Signed)
Patient advised. Patient states she does not have a preference, wants to know what Dr Darnell Level thinks in regards to who she should see, recommendation.  Patient states what happened in the past was that she was suppose to have had colonoscopy done and on that day she drank a little bit of ensure and had crackers, when she got to the office per patient "her stool was not clear and Dr Collene Mares refused to do the procedure." Per patient that is why this doctor does not want to see her, patient states that is not right that she made a mistake and now provider does not want to deal with her. Patient states well she does not want to go there with that treatment. Patient wanted me to let Dr Darnell Level know what happened.

## 2020-08-24 NOTE — Telephone Encounter (Signed)
Called GI office back and spoke with Lattie Haw to get the reason for denial, per Lattie Haw :due to non compliance from patient in the past not able to schedule at their office again," I called and left message for the patient to call back to discuss and see if she would like to go to another office.

## 2020-08-28 ENCOUNTER — Telehealth: Payer: Self-pay

## 2020-08-28 NOTE — Telephone Encounter (Signed)
New referral placed.

## 2020-08-28 NOTE — Telephone Encounter (Signed)
Pt came into clinic requesting someone assess a tick bite. Pt had area on R side under arm where tick bite occurred. She thinks the head of the tick might still be attached. Bite occurred yesterday.  Assessment could not determine if tick head was still attached. Scab was covering bite, and could have been obscuring being able to identify the head. Slightly red borders.  Discussed with PCP and per PCP place pt on schedule for tomorrow at [place] pt on schedule. Pt agreed to apt.

## 2020-08-28 NOTE — Addendum Note (Signed)
Addended by: Kris Mouton on: 08/28/2020 03:14 PM   Modules accepted: Orders

## 2020-08-29 ENCOUNTER — Ambulatory Visit: Payer: Medicare PPO | Admitting: Family Medicine

## 2020-08-29 ENCOUNTER — Telehealth: Payer: Self-pay

## 2020-08-29 ENCOUNTER — Other Ambulatory Visit: Payer: Self-pay

## 2020-08-29 ENCOUNTER — Encounter: Payer: Self-pay | Admitting: Family Medicine

## 2020-08-29 DIAGNOSIS — W57XXXA Bitten or stung by nonvenomous insect and other nonvenomous arthropods, initial encounter: Secondary | ICD-10-CM

## 2020-08-29 DIAGNOSIS — S40861A Insect bite (nonvenomous) of right upper arm, initial encounter: Secondary | ICD-10-CM

## 2020-08-29 NOTE — Telephone Encounter (Signed)
Sending note to [facility] referrals and [facility] CMA.

## 2020-08-29 NOTE — Telephone Encounter (Signed)
[place] Night - Client Nonclinical Telephone Record AccessNurse Client [place] Night - Client Client Site [place] Physician Ria Bush - MD Contact Type Call Who Is Calling Patient / Member / Family / Caregiver Caller Name [place] Phone Number [phone] Patient Name Karen [name] Patient DOB 08-31-1945 Call Type Message Only Information Provided Reason for Call Request for General Office Information Initial Comment Caller states she is scheduled for hemorrhoid surgery tomorrow. She needs to know where she needs to go. Disp. Time Disposition Final User 08/28/2020 8:15:58 PM General Information Provided Yes Rica Mote Call Closed By: Rica Mote Transaction Date/Time: 08/28/2020 8:13:16 PM (ET)

## 2020-08-29 NOTE — Patient Instructions (Signed)
Looks like poorly healing wound after tick bite. Treat with daily vaseline or triple antibiotic ointment and cover with bandaid. Let us know if not healing well with this.

## 2020-08-29 NOTE — Progress Notes (Signed)
Patient ID: Kaavya Puskarich, female    DOB: [birth year], 75 y.o.   MRN: [id number]  This visit was conducted in person.  BP 122/70   Pulse 71   Temp 98.1 F (36.7 C) (Temporal)   Ht 5\' 3"  (1.6 m)   Wt 162 lb 8 oz (73.7 kg)   LMP  (LMP Unknown)   SpO2 98%   BMI 28.79 kg/m    CC: check tick bite  Subjective:   HPI: Kylah Maresh is a 75 y.o. female presenting on 08/29/2020 for Insect Bite (Previously seen for tick bite located on anterior right shoulder.  Here for f/u of bite.  Pt states area is much better. )   Found brown tick to right upper chest wall, unsure how long it was attached. She removed it but it is not healing well. Unsure how long it was attached.  Hasn't tried anything for this yet.   No fevers/chills, joint pains, abd pain, nausea, headache, skin rash.      Relevant past medical, surgical, family and social history reviewed and updated as indicated. Interim medical history since our last visit reviewed. Allergies and medications reviewed and updated. Outpatient Medications Prior to Visit  Medication Sig Dispense Refill  . acetaminophen (TYLENOL) 500 MG tablet Take 1 tablet (500 mg total) by mouth every 6 (six) hours as needed.    . calcium-vitamin D (OSCAL WITH D) 500-200 MG-UNIT tablet Take 1 tablet by mouth 3 (three) times daily. 90 tablet 6  . Cholecalciferol (VITAMIN D3) 50 MCG (2000 UT) TABS Take 2,000 Units by mouth daily.    . hydrocortisone (ANUSOL-HC) 25 MG suppository UNWRAP & INSERT 1 SUPPOSITORY RECTALLY 2 TIMES DAILY AS NEEDED FOR HEMORRHOIDS (USE SPARINGLY) 12 suppository 0  . hydrOXYzine (ATARAX/VISTARIL) 10 MG tablet TAKE 1-2 TABLETS (10-20 MG TOTAL) BY MOUTH 2 (TWO) TIMES DAILY AS NEEDED FOR ANXIETY. 30 tablet 5  . levothyroxine (SYNTHROID) 50 MCG tablet Take 1 tablet (50 mcg total) by mouth daily. 90 tablet 3  . Multiple Vitamin (MULTIVITAMIN WITH MINERALS) TABS tablet Take 1 tablet by mouth daily. V[employer]-[facility] Women Multivitamn    . PARoxetine (PAXIL)  20 MG tablet Take 0.5 tablets (10 mg total) by mouth daily.    . vitamin E 180 MG (400 UNITS) capsule Take 400 Units by mouth daily.     No facility-administered medications prior to visit.     Per HPI unless specifically indicated in ROS section below Review of Systems Objective:  BP 122/70   Pulse 71   Temp 98.1 F (36.7 C) (Temporal)   Ht 5\' 3"  (1.6 m)   Wt 162 lb 8 oz (73.7 kg)   LMP  (LMP Unknown)   SpO2 98%   BMI 28.79 kg/m   Wt Readings from Last 3 Encounters:  08/29/20 162 lb 8 oz (73.7 kg)  08/21/20 163 lb 8 oz (74.2 kg)  08/07/20 162 lb 9 oz (73.7 kg)      Physical Exam Vitals and nursing note reviewed.  Constitutional:      Appearance: Normal appearance. She is not ill-appearing.  Skin:    General: Skin is warm and dry.     Findings: Erythema present. No rash.     Comments: R upper chest wall below axilla with small scab with mild surrounding erythema, no obvious residual tick part appreciated, no streaking redness  Neurological:     Mental Status: She is alert.       Assessment & Plan:  This visit  occurred during the SARS-CoV-2 public health emergency.  Safety protocols were in place, including screening questions prior to the visit, additional usage of staff PPE, and extensive cleaning of exam room while observing appropriate contact time as indicated for disinfecting solutions.   Problem List Items Addressed This Visit    Tick bite of axillary region, right, initial encounter    Unsure when she sustained tick bite, unsure how long attached. Area looks like slowly healing tick bite.  Area cleaned with alcohol pad and dressed with abx ointment and band aid.  Reviewed home care. Update if not improving as expected.  Advised watch for signs of tick borne illness over the next week, let us know if any of these develop for oral abx course.           No orders of the defined types were placed in this encounter.  No orders of the defined types were placed  in this encounter.   Patient Instructions  Looks like poorly healing wound after tick bite. Treat with daily vaseline or triple antibiotic ointment and cover with bandaid. Let us know if not healing well with this.    Follow up plan: No follow-ups on file.  Ria Bush, MD

## 2020-08-29 NOTE — Telephone Encounter (Signed)
Pt called in returning phone call. Related the message she understood and will call the GI

## 2020-08-29 NOTE — Assessment & Plan Note (Addendum)
Unsure when she sustained tick bite, unsure how long attached. Area looks like slowly healing tick bite.  Area cleaned with alcohol pad and dressed with abx ointment and band aid.  Reviewed home care. Update if not improving as expected.  Advised watch for signs of tick borne illness over the next week, let us know if any of these develop for oral abx course.

## 2020-08-29 NOTE — Telephone Encounter (Signed)
Left message for patient to call back. I do not have any information in regards to a hemorrhoid surgery for the patient. If she is asking about referral to gastroenterology, this was put in yesterday and sent to [facility] office in [place] and patient can call them directly to schedule at [phone]

## 2020-08-30 ENCOUNTER — Telehealth: Payer: Self-pay

## 2020-08-30 DIAGNOSIS — E039 Hypothyroidism, unspecified: Secondary | ICD-10-CM

## 2020-08-30 MED ORDER — LEVOTHYROXINE SODIUM 25 MCG PO TABS: 25.0000 ug | ORAL_TABLET | Freq: Every day | ORAL | 1 refills | Status: DC

## 2020-08-30 NOTE — Telephone Encounter (Signed)
Spoke with pt relaying Dr. Synthia Innocent message.  Pt verbalizes understanding.  Says she only took 1/2 50 mcg tab today.  Scheduled 6 wk lab visit on 10/11/20 at 9:35.

## 2020-08-30 NOTE — Telephone Encounter (Signed)
Pt left v/m requesting a decrease in dosage of levothyroxine from 50 mcg to 25 mcg. Pt said on 2 different days recently pt had BM in public; pt could not control having the BM and pt wants to decrease the levothyroxine by taking 1/2 pill daily instead of whole pill to see if that helps. Pt request cb after reviewed by Dr Darnell Level.

## 2020-08-30 NOTE — Telephone Encounter (Signed)
Ok to try lower dose 34mcg sent to pharmacy. Will need to schedule lab visit in 6 wks after starting lower dose.  I think issue may be more related to rectal prolapse so important to f/u with GI for this.

## 2020-08-31 NOTE — Telephone Encounter (Signed)
[facility] Night - Client Nonclinical Telephone Record Access[facility]urse Client [facility] Night - Client Client Site [place] Physician Ria Bush - MD Contact Type Call Who Is Calling Patient / Member / Family / Caregiver Caller Name [place] Phone Number [phone] Patient Name Karen [name] Patient DOB Nov 25, 1945 Call Type Message Only Information Provided Reason for Call Request for General Office Information Initial Comment Caller states she cannot take Levothyroxine, she just got a refill today but it's too strong for her and the doctor is aware. Caller states she isn't wanting to take it and wants to know what to do because it causes diarrhea. Caller states she is not going to take it and wants a call back. Additional Comment Office hours provided. Caller declined triage but wants clarification from the office. Disp. Time Disposition Final User 08/30/2020 5:11:21 PM General Information Provided Yes Melanee Spry Call Closed By: Melanee Spry Transaction Date/Time: 08/30/2020 5:07:10 PM (ET)

## 2020-08-31 NOTE — Telephone Encounter (Addendum)
She can stop levothyroxine if she desires, we will recheck thyroid levels next OV.  However as previously discussed, I think issue is more with rectal prolapse than with thyroid medicine.  Will await GI evaluation.

## 2020-08-31 NOTE — Telephone Encounter (Signed)
Spoke with pt relaying Dr. Synthia Innocent message.  Pt verbalizes understanding and will do whatever Dr. Darnell Level says.

## 2020-09-07 ENCOUNTER — Telehealth: Payer: Self-pay | Admitting: *Deleted

## 2020-09-07 NOTE — Telephone Encounter (Signed)
[facility] is backed up on referrals. May take some time to get her in to be seen.

## 2020-09-07 NOTE — Telephone Encounter (Signed)
PLEASE NOTE: All timestamps contained within this report are represented as [place] Standard Time. CONFIDENTIALTY NOTICE: This fax transmission is intended only for the addressee. It contains information that is legally privileged, confidential or otherwise protected from use or disclosure. If you are not the intended recipient, you are strictly prohibited from reviewing, disclosing, copying using or disseminating any of this information or taking any action in reliance on or regarding this information. If you have received this fax in error, please notify us immediately by telephone so that we can arrange for its return to [place]. Phone: [phone], Toll-Free: [phone], Fax: [phone] Page: 1 of 1 Call Id: [id number] [facility] Night - Client Nonclinical Telephone Record  [facility] Client [facility] Night - Client Client Site [place] Physician Ria Bush - MD Contact Type Call Who Is Calling Patient / Member / Family / Caregiver Caller Name [place] Phone Number [phone] Patient Name Karen [name] Patient DOB [birth year] Call Type Message Only Information Provided Reason for Call Request for General Office Information Initial Comment Caller states that she was told she needed Hemorrhoid surgery. The referred doctor would not perform the surgery. Wants him to refer her to another doctor. Please call. Disp. Time Disposition Final User 09/06/2020 5:24:08 PM General Information Provided Yes Lawernce Keas Call Closed By: Lawernce Keas Transaction Date/Time: 09/06/2020 5:19:00 PM (ET)

## 2020-09-07 NOTE — Telephone Encounter (Signed)
Spoke with pt relaying Dr. Synthia Innocent message.  Pt verbalizes understanding and states she is in no rush.

## 2020-09-17 NOTE — Telephone Encounter (Signed)
Left message with details on patient's voicemail-provided [facility] office's number [phone].

## 2020-09-17 NOTE — Telephone Encounter (Signed)
Pt called saying she is still having diarrhea issues. She will stop the levothyroxine until her appt with Dr Darnell Level in July. She missed a call from [facility] but did not answer it. Asking if she can get the number to [facility] to call them in case that was them calling her about the referral.

## 2020-09-19 ENCOUNTER — Telehealth: Payer: Self-pay | Admitting: *Deleted

## 2020-09-19 NOTE — Telephone Encounter (Signed)
Ok to be seen but I am out of office tomorrow.  Could schedule for Friday 12:30pm if pt interested or see if another provider able to see her tomorrow.  May try plain allegra, not allegra D for possible allergic component

## 2020-09-19 NOTE — Telephone Encounter (Signed)
Patient called stating that she is having problems with her voice. Patient stated that she has some hoarseness and her nose is a little stuffy. Patient denies a fever, cough, sore throat or runny nose. Patient stated that she feels that everything is in her throat. Patient stated that she feels that she may be having problems with allergies. Patient stated that she has not been around anyone that has covid. Patient wants to know if she can take Allegra D with all of the medications that she is taking. Patient stated that her sister recommenced to her that she take it.

## 2020-09-19 NOTE — Telephone Encounter (Signed)
Patient left another message stating that she is having a problem with her throat and something is not right. Patient sounds like she has some head congestion. Patient wants to know if Dr. Danise Mina would want to see her.

## 2020-09-20 NOTE — Telephone Encounter (Signed)
I spoke with Karen [name] ; Karen [name] notified as instructed and Karen [name] said today her throat is sore but thinks she wants to wait about scheduling an appt and Karen [name] will cb if she thinks she needs to be seen. Offered to go ahead and schedule appt with Dr Darnell Level on 09/21/20 but Karen [name] said she wants to wait and understands appt might get taken. Karen [name] voiced understanding but will wait and see how she does.  UC precautions given and Karen [name] voiced understanding. Sending FYI to Dr Baldwin Crown CMA.

## 2020-09-20 NOTE — Telephone Encounter (Signed)
Routing to triage to follow up and get patient scheduled.

## 2020-09-21 ENCOUNTER — Telehealth: Payer: Medicare PPO | Admitting: Family Medicine

## 2020-09-21 ENCOUNTER — Other Ambulatory Visit: Payer: Self-pay

## 2020-09-27 ENCOUNTER — Telehealth: Payer: Self-pay

## 2020-09-27 NOTE — Telephone Encounter (Signed)
Pt left v/m that she has cut the levothyroxine in half and pt is still having diarrhea. Per v/m pt just wanted Dr Darnell Level to be aware that still having diarrhea. Pt has appt to see Dr Darnell Level 10/12/20.please also see 08/30/20 phone note.

## 2020-09-28 NOTE — Telephone Encounter (Signed)
As discussed at previous phone note - ok to hold levothyroxine at this time. We can reassess at f/u visit.

## 2020-09-28 NOTE — Telephone Encounter (Signed)
Pt left v/m that pt is still having diarrhea taking 1/2 tab of levothyroxine 67mcg. Pt had diarrhea again this morning and pt states she cannot take that med and request cb with what Dr Darnell Level wants pt to do.[facility]

## 2020-10-02 NOTE — Telephone Encounter (Signed)
[facility] Night - Client TELEPHONE ADVICE RECORD AccessNurse Patient Name: [facility] Gender: Female DOB: 26-Dec-1945 Age: 75 Y 41 M 21 D Return Phone Number: [phone] (Primary), [phone] (Secondary) Address: City/ State/ Zip: [place] [address] Client [facility] Night - Client Client Site [place] Physician Ria Bush - MD Contact Type Call Who Is Calling Patient / Member / Family / Caregiver Call Type Triage / Clinical Relationship To Patient Self Return Phone Number [phone] (Primary) Chief Complaint Diarrhea Reason for Call Symptomatic / Request for [place] states she's on a thyroid medication and it gives her really bad diarrhea. The medication is levothyroxine. She has used the bathroom on herself twice today. She can't even leave the house taking it. She tried taking it in halves. Translation No Nurse Assessment Nurse: Ardine Bjork, RN, Melissa Date/Time (Eastern Time): 09/28/2020 6:03:54 PM Confirm and document reason for call. If symptomatic, describe symptoms. ---Caller states she's on a thyroid medication and it gives her really bad diarrhea. The medication is levothyroxine. She has used the bathroom on herself twice today. She can't even leave the house taking it. She tried taking it in halves. Started med few weekstaking Levothyroxine-has not effected her before. Diarrhea started yesterday. Diarrhea x 2 today and did not make it to the toilet. Does the patient have any new or worsening symptoms? ---Yes Will a triage be completed? ---Yes Related visit to physician within the last 2 weeks? ---No Does the PT have any chronic conditions? (i.e. diabetes, asthma, this includes High risk factors for pregnancy, etc.) ---Yes List chronic conditions. ---Thyroid.Depression. Is this a behavioral health or substance abuse call?  ---No Guidelines Guideline Title Affirmed Question Affirmed Notes Nurse Date/Time (Eastern Time) Diarrhea MILD-MODERATE diarrhea (e.g., 1-6 Zayas, RN, Lenna Sciara 09/28/2020 6:06:20 PM PLEASE NOTE: All timestamps contained within this report are represented as [place] Standard Time. CONFIDENTIALTY NOTICE: This fax transmission is intended only for the addressee. It contains information that is legally privileged, confidential or otherwise protected from use or disclosure. If you are not the intended recipient, you are strictly prohibited from reviewing, disclosing, copying using or disseminating any of this information or taking any action in reliance on or regarding this information. If you have received this fax in error, please notify us immediately by telephone so that we can arrange for its return to [place]. Phone: [phone], Toll-Free: [phone], Fax: [phone] Page: 2 of 2 Call Id: [id number] Guidelines Guideline Title Affirmed Question Affirmed Notes Nurse Date/Time [name] Time) times / day more than normal) Disp. Time [name] Time) Disposition Final User 09/28/2020 5:44:14 PM Attempt made - message left Zayas, RN, Melissa 09/28/2020 6:12:41 PM See PCP within 24 Hours Yes Zayas, RN, Melissa Disposition Overriden: Home Care Override Reason: Patient's symptoms need a higher level of care Caller Disagree/Comply Disagree Caller Understands Yes PreDisposition InappropriateToAsk Care Advice Given Per Guideline SEE PCP WITHIN 24 HOURS: * IF OFFICE WILL BE CLOSED: You need to be seen within the next 24 hours. A clinic or an urgent care center is often a good source of care if your doctor's office is closed or you can't get an appointment. CALL BACK IF: * Signs of dehydration occur (e.g., no urine over 12 hours, very dry mouth, lightheaded, etc.) * Bloody stools * Constant or severe abdomen pain * You become worse CARE ADVICE given per Diarrhea (Adult) guideline. CALL BACK IF: * You become  worse Comments User: Lenna Sciara,  Ardine Bjork, RN Date/Time [name] Time): 09/28/2020 5:44:40 PM Initial call to primary contact-busy signal. User: Dub Mikes, RN Date/Time [name] Time): 09/28/2020 6:13:13 PM Due to bowel incontinence with stool and pt'g age, this nurse elevated dispo. User: Dub Mikes, RN Date/Time [name] Time): 09/28/2020 6:13:59 PM Pt pleasant and appreciative. Referrals GO TO FACILITY REFUSED

## 2020-10-02 NOTE — Telephone Encounter (Signed)
Contacted pt and advised she can stop medication until f/u. Pt reported she has already stopped taking the med and will contact office if any further symptoms. Pt verbalized understanding. Advised pt she has apt for lab on 7/14 and f/u with PCP on 7/15.

## 2020-10-11 ENCOUNTER — Other Ambulatory Visit: Payer: Medicare PPO

## 2020-10-11 ENCOUNTER — Other Ambulatory Visit: Payer: Self-pay

## 2020-10-11 ENCOUNTER — Telehealth: Payer: Self-pay

## 2020-10-11 NOTE — Telephone Encounter (Signed)
Lab appointment was for 6 week recheck of thyroid labs which looks like was cancelled.  She is seeing Dr. Darnell Level on 10/12/2020 so thyroid labs can be checked at that visit.

## 2020-10-11 NOTE — Telephone Encounter (Signed)
Will see tomorrow

## 2020-10-11 NOTE — Telephone Encounter (Signed)
Patient states she needs refill on hemorrhoid suppository again. Patient advised Dr Darnell Level is not in the office today. Patient states Dr Darnell Level is aware of this issue that patient has. Please review.

## 2020-10-11 NOTE — Telephone Encounter (Signed)
[facility] Night - Client Nonclinical Telephone Record AccessNurse Client [facility] Night - Client Client Site [place] Physician Ria Bush - MD Contact Type Call Who Is Calling Patient / Member / Family / Caregiver Caller Name Karen [name] Phone Number N/A Patient Name Karen [name] Patient DOB N/A Call Type Message Only Information Provided Reason for Call Request for General Office Information Initial Comment Caller states she would like to know why she has an appointment scheduled for labs. Disp. Time Disposition Final User 10/11/2020 8:04:08 AM General Information Provided Yes [name], Karen [name] Call Closed By: Lezlie Octave Transaction Date/Time: 10/11/2020 8:02:29 AM (ET)

## 2020-10-12 ENCOUNTER — Telehealth: Payer: Self-pay

## 2020-10-12 ENCOUNTER — Ambulatory Visit: Payer: Medicare PPO | Admitting: Family Medicine

## 2020-10-12 MED ORDER — HYDROCORTISONE ACETATE 25 MG RE SUPP: RECTAL | 0 refills | Status: DC

## 2020-10-12 NOTE — Telephone Encounter (Signed)
[name] - Client Nonclinical Telephone Record AccessNurse Client [name] - Client Client Site [place] Physician Ria Bush - MD Contact Type Call Who Is Calling Patient / Member / Family / Caregiver Caller Name [place] Phone Number [phone] Patient Name Karen [name] Patient DOB 03-09-1942 Call Type Message Only Information Provided Reason for Call Request to [facility] Appointment Initial Comment Caller states she need to cancel her appointment. Patient request to speak to RN No Additional Comment Provided caller with office hours. Caller declined triage. Disp. Time Disposition Final User 10/11/2020 8:36:00 PM General Information Provided Yes Raliegh Scarlet Call Closed By: Raliegh Scarlet Transaction Date/Time: 10/11/2020 8:30:06 PM (ET)

## 2020-10-12 NOTE — Telephone Encounter (Signed)
Sent. Thanks.   

## 2020-10-16 ENCOUNTER — Ambulatory Visit: Payer: Medicare PPO | Admitting: Family Medicine

## 2020-10-23 ENCOUNTER — Ambulatory Visit: Payer: Medicare PPO | Admitting: Family Medicine

## 2020-10-30 ENCOUNTER — Ambulatory Visit: Payer: Medicare PPO | Admitting: Family Medicine

## 2020-10-30 ENCOUNTER — Other Ambulatory Visit: Payer: Self-pay

## 2020-10-30 ENCOUNTER — Encounter: Payer: Self-pay | Admitting: Family Medicine

## 2020-10-30 VITALS — BP 118/76 | HR 91 | Temp 98.0°F | Ht 63.0 in | Wt 160.4 lb

## 2020-10-30 DIAGNOSIS — N1831 Chronic kidney disease, stage 3a: Secondary | ICD-10-CM

## 2020-10-30 DIAGNOSIS — E039 Hypothyroidism, unspecified: Secondary | ICD-10-CM | POA: Diagnosis not present

## 2020-10-30 DIAGNOSIS — R159 Full incontinence of feces: Secondary | ICD-10-CM | POA: Diagnosis not present

## 2020-10-30 DIAGNOSIS — K623 Rectal prolapse: Secondary | ICD-10-CM | POA: Diagnosis not present

## 2020-10-30 DIAGNOSIS — F4323 Adjustment disorder with mixed anxiety and depressed mood: Secondary | ICD-10-CM | POA: Diagnosis not present

## 2020-10-30 DIAGNOSIS — K5909 Other constipation: Secondary | ICD-10-CM

## 2020-10-30 DIAGNOSIS — R152 Fecal urgency: Secondary | ICD-10-CM

## 2020-10-30 NOTE — Assessment & Plan Note (Signed)
Continue levothyroxine 93mg daily which she is tolerating better than higher 571m dose.

## 2020-10-30 NOTE — Assessment & Plan Note (Signed)
Most recent issue was diarrhea.  Stable period on low dose levothyroxine.
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
## 2020-10-30 NOTE — Assessment & Plan Note (Signed)
Stable period. Update renal panel. BP well controlled.

## 2020-10-30 NOTE — Assessment & Plan Note (Signed)
This has improved. ?rectal prolapse/int hemorrhoid related, pending GI eval.

## 2020-10-30 NOTE — Assessment & Plan Note (Signed)
Overall stable period. Has done well with taper to '10mg'$  dose paxil for last 2 months. Interested in further titration. Discussed risk of recurrent depression once antidepressants stopped. I asked her to monitor mood closely and in addition have family monitor her mood closely, and if depressed mood developing, to restart paxil. She agrees with plan.

## 2020-10-30 NOTE — Patient Instructions (Addendum)
Call  GI at [phone] to check on appointment.  Continue levothyroxine 58mg daily.  Ok to try full taper off paxil. Watch for and have your children watch for worsening mood or recurrent depression and if that happens, restart paxil '10mg'$ . Let me know how you do with this change.  Remember to take vitamin D3 2000 units every day.  Labs today to check kidney function.

## 2020-10-31 ENCOUNTER — Telehealth: Payer: Self-pay | Admitting: *Deleted

## 2020-10-31 LAB — RENAL FUNCTION PANEL
Albumin: 4 g/dL (ref 3.5–5.2)
BUN: 17 mg/dL (ref 6–23)
CO2: 25 mEq/L (ref 19–32)
Calcium: 9.1 mg/dL (ref 8.4–10.5)
Chloride: 106 mEq/L (ref 96–112)
Creatinine, Ser: 1.22 mg/dL — ABNORMAL HIGH (ref 0.40–1.20)
GFR: 43.37 mL/min — ABNORMAL LOW (ref >60.00)
Glucose, Bld: 99 mg/dL (ref 70–99)
Phosphorus: 3.3 mg/dL (ref 2.3–4.6)
Potassium: 3.5 mEq/L (ref 3.5–5.1)
Sodium: 141 mEq/L (ref 135–145)

## 2020-10-31 LAB — TSH: TSH: 3.71 u[IU]/mL (ref 0.35–5.50)

## 2020-10-31 NOTE — Telephone Encounter (Signed)
Patient left a voicemail stating that she is out of her Hydroxyzine and needs a refill.

## 2020-10-31 NOTE — Telephone Encounter (Signed)
Called patient back to advise her that she has refills.  Patient stated to disregard the request because she has already gotten this straightened out.

## 2020-11-09 ENCOUNTER — Telehealth: Payer: Self-pay

## 2020-11-09 NOTE — Telephone Encounter (Signed)
Pt called back to let Dr Darnell Level know she has an appt with GI 12-21-20.

## 2020-11-09 NOTE — Telephone Encounter (Signed)
Pt called and left a VM on triage asking about her appt at GI as she has not heard from anyone. I called and got pt's VM. Per DPR I left a detailed message stating Dr Danise Mina had noted for pt to call the GI office to inquire about the pending referral. I left her the phone number, again. Advised that she can call us if there is a problem. They should have the referral in the system.

## 2020-11-14 ENCOUNTER — Encounter: Payer: Self-pay | Admitting: Gastroenterology

## 2020-11-16 ENCOUNTER — Other Ambulatory Visit: Payer: Self-pay | Admitting: Family Medicine

## 2020-12-21 ENCOUNTER — Ambulatory Visit: Payer: Medicare PPO | Admitting: Gastroenterology

## 2020-12-21 ENCOUNTER — Encounter: Payer: Self-pay | Admitting: Gastroenterology

## 2020-12-21 VITALS — BP 120/76 | HR 72 | Ht 62.25 in | Wt 159.5 lb

## 2020-12-21 DIAGNOSIS — K641 Second degree hemorrhoids: Secondary | ICD-10-CM
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
## 2020-12-21 NOTE — Patient Instructions (Signed)
If you are age 76 or older, your body mass index should be between 23-30. Your Body mass index is 28.94 kg/m. If this is out of the aforementioned range listed, please consider follow up with your Primary Care Provider.  If you are age 70 or younger, your body mass index should be between 19-25. Your Body mass index is 28.94 kg/m. If this is out of the aformentioned range listed, please consider follow up with your Primary Care Provider.   Please purchase Metamucil over the counter. Take as directed.   Hemorrhoid Banding Scheduled for 12/26/20 @3 :40pm  The [place] [facility] providers would like to encourage you to use [facility] to communicate with providers for non-urgent requests or questions.  Due to long hold times on the telephone, sending your provider a message by [facility] may be a faster and more efficient way to get a response.  Please allow 48 business hours for a response.  Please remember that this is for non-urgent requests.   It was a pleasure to see you today!  Thank you for trusting me with your gastrointestinal care!    Scott E. Candis Schatz, MD

## 2020-12-26 ENCOUNTER — Encounter: Payer: Self-pay | Admitting: Gastroenterology

## 2020-12-26 ENCOUNTER — Ambulatory Visit: Payer: Medicare PPO | Admitting: Gastroenterology

## 2020-12-26 VITALS — BP 138/66 | HR 65 | Ht 62.25 in | Wt 160.0 lb

## 2020-12-26 DIAGNOSIS — K641 Second degree hemorrhoids: Secondary | ICD-10-CM | POA: Diagnosis not present

## 2020-12-26 NOTE — Patient Instructions (Addendum)
If you are age 76 or older, your body mass index should be between 23-30. Your Body mass index is 29.03 kg/m. If this is out of the aforementioned range listed, please consider follow up with your Primary Care Provider.  If you are age 36 or younger, your body mass index should be between 19-25. Your Body mass index is 29.03 kg/m. If this is out of the aformentioned range listed, please consider follow up with your Primary Care Provider.   HEMORRHOID BANDING PROCEDURE    FOLLOW-UP CARE   The procedure you have had should have been relatively painless since the banding of the area involved does not have nerve endings and there is no pain sensation.  The rubber band cuts off the blood supply to the hemorrhoid and the band may fall off as soon as 48 hours after the banding (the band may occasionally be seen in the toilet bowl following a bowel movement). You may notice a temporary feeling of fullness in the rectum which should respond adequately to plain Tylenol or Motrin.  Following the banding, avoid strenuous exercise that evening and resume full activity the next day.  A sitz bath (soaking in a warm tub) or bidet is soothing, and can be useful for cleansing the area after bowel movements.     To avoid constipation, take two tablespoons of natural wheat bran, natural oat bran, flax, Benefiber or any over the counter fiber supplement and increase your water intake to 7-8 glasses daily.    Unless you have been prescribed anorectal medication, do not put anything inside your rectum for two weeks: No suppositories, enemas, fingers, etc.  Occasionally, you may have more bleeding than usual after the banding procedure.  This is often from the untreated hemorrhoids rather than the treated one.  Don't be concerned if there is a tablespoon or so of blood.  If there is more blood than this, lie flat with your bottom higher than your head and apply an ice pack to the area. If the bleeding does not stop  within a half an hour or if you feel faint, call our office at [phone] or go to the emergency room.  Problems are not common; however, if there is a substantial amount of bleeding, severe pain, chills, fever or difficulty passing urine (very rare) or other problems, you should call us at [phone] or report to the nearest emergency room.  Do not stay seated continuously for more than 2-3 hours for a day or two after the procedure.  Tighten your buttock muscles 10-15 times every two hours and take 10-15 deep breaths every 1-2 hours.  Do not spend more than a few minutes on the toilet if you cannot empty your bowel; instead re-visit the toilet at a later time.    The [place] [facility] providers would like to encourage you to use [facility] to communicate with providers for non-urgent requests or questions.  Due to long hold times on the telephone, sending your provider a message by [facility] may be a faster and more efficient way to get a response.  Please allow 48 business hours for a response.  Please remember that this is for non-urgent requests.   It was a pleasure to see you today!  Thank you for trusting me with your gastrointestinal care!    Scott E. Candis Schatz, MD

## 2020-12-26 NOTE — Progress Notes (Signed)
PROCEDURE NOTE: The patient presents with symptomatic grade 2  hemorrhoids, requesting rubber band ligation of his/her hemorrhoidal disease.  All risks, benefits and alternative forms of therapy were described and informed consent was obtained.   The anorectum was pre-medicated with topical lidocaine (5%) and nitroglycerin (0.125%) The decision was made to band the left lateral internal hemorrhoid, and the Barnes was used to perform band ligation without complication.  Digital anorectal examination was then performed to assure proper positioning of the band, and to adjust the banded tissue as required.  The patient was discharged home without pain or other issues.  Dietary and behavioral recommendations were given and along with follow-up instructions.     The following adjunctive treatments were recommended:  Daily fiber supplementation with Metamucil Adequate water intake Avoidance of straining with defecation and prolonged bowel movements  The patient will return in 2 to 4 weeks for  follow-up and possible additional banding as required. No complications were encountered and the patient tolerated the procedure well.

## 2021-01-08 ENCOUNTER — Other Ambulatory Visit: Payer: Self-pay

## 2021-01-08 ENCOUNTER — Ambulatory Visit (INDEPENDENT_AMBULATORY_CARE_PROVIDER_SITE_OTHER): Payer: Medicare PPO | Admitting: Family Medicine

## 2021-01-08 ENCOUNTER — Encounter: Payer: Self-pay | Admitting: Family Medicine

## 2021-01-08 VITALS — BP 128/72 | HR 69 | Temp 98.2°F | Ht 62.25 in | Wt 162.1 lb

## 2021-01-08 DIAGNOSIS — K641 Second degree hemorrhoids: Secondary | ICD-10-CM | POA: Diagnosis not present

## 2021-01-08 DIAGNOSIS — F4323 Adjustment disorder with mixed anxiety and depressed mood: Secondary | ICD-10-CM | POA: Diagnosis not present

## 2021-01-08 DIAGNOSIS — E039 Hypothyroidism, unspecified: Secondary | ICD-10-CM | POA: Diagnosis not present

## 2021-01-08 DIAGNOSIS — Z23 Encounter for immunization: Secondary | ICD-10-CM

## 2021-01-08 MED ORDER — PSYLLIUM 58.6 % PO PACK: 1.0000 | PACK | Freq: Every day | ORAL | Status: AC | PRN

## 2021-01-08 NOTE — Patient Instructions (Addendum)
Flu shot today  Ok to stay off paroxetine at this time. Watch for returning mood trouble.  Continue levothyroxine 20mcg daily.  Schedule physical /wellness visit in 3 months

## 2021-01-08 NOTE — Assessment & Plan Note (Signed)
Stable period on lowest levothyroxine 44mcg dose. Continue this.

## 2021-01-08 NOTE — Progress Notes (Signed)
Patient ID: Karen [name], female    DOB: 26-May-1945, 75 y.o.   MRN: [id number]  This visit was conducted in person.  BP 128/72   Pulse 69   Temp 98.2 F (36.8 C) (Temporal)   Ht 5' 2.25" (1.581 m)   Wt 162 lb 1 oz (73.5 kg)   LMP  (LMP Unknown)   SpO2 98%   BMI 29.40 kg/m    CC: f/u visit  Subjective:   HPI: Karen [name] is a 75 y.o. female presenting on 01/08/2021 for Follow-up (Here for GI f/u.)   Last seen 2 months ago - at that time we fully tapered off paxil per patient's request. She has continued taking 1/2 tablet until now. She also continues low dose levothyroxine 32mcg daily for mild hypothyroidism manifesting predominantly with constipation.   In interim saw GI Dr Candis Schatz for prolapsing hemorrhoids s/p serial hemorrhoid banding first treatment 12/26/2020, rec daily metamucil. Planned close f/u later this month.      Relevant past medical, surgical, family and social history reviewed and updated as indicated. Interim medical history since our last visit reviewed. Allergies and medications reviewed and updated. Outpatient Medications Prior to Visit  Medication Sig Dispense Refill   acetaminophen (TYLENOL) 500 MG tablet Take 1 tablet (500 mg total) by mouth every 6 (six) hours as needed.     calcium-vitamin D (OSCAL WITH D) 500-200 MG-UNIT tablet Take 1 tablet by mouth 3 (three) times daily. 90 tablet 6   Cholecalciferol (VITAMIN D3) 50 MCG (2000 UT) TABS Take 2,000 Units by mouth daily.     hydrOXYzine (ATARAX/VISTARIL) 10 MG tablet TAKE 1 TO 2 TABLETS BY MOUTH TWICE A DAY AS NEEDED FOR ANXIETY 30 tablet 5   levothyroxine (SYNTHROID) 25 MCG tablet Take 1 tablet (25 mcg total) by mouth daily. 90 tablet 1   Multiple Vitamin (MULTIVITAMIN WITH MINERALS) TABS tablet Take 1 tablet by mouth daily. [facility] Women Multivitamn     vitamin E 180 MG (400 UNITS) capsule Take 400 Units by mouth daily.     PARoxetine (PAXIL) 20 MG tablet Take 0.5 tablets (10 mg total) by mouth  daily.     No facility-administered medications prior to visit.     Per HPI unless specifically indicated in ROS section below Review of Systems  Objective:  BP 128/72   Pulse 69   Temp 98.2 F (36.8 C) (Temporal)   Ht 5' 2.25" (1.581 m)   Wt 162 lb 1 oz (73.5 kg)   LMP  (LMP Unknown)   SpO2 98%   BMI 29.40 kg/m   Wt Readings from Last 3 Encounters:  01/08/21 162 lb 1 oz (73.5 kg)  12/26/20 160 lb (72.6 kg)  12/21/20 159 lb 8 oz (72.3 kg)      Physical Exam Vitals and nursing note reviewed.  Constitutional:      Appearance: Normal appearance. She is not ill-appearing.  Cardiovascular:     Rate and Rhythm: Normal rate and regular rhythm.     Pulses: Normal pulses.     Heart sounds: Normal heart sounds. No murmur heard. Pulmonary:     Effort: Pulmonary effort is normal. No respiratory distress.     Breath sounds: Normal breath sounds. No wheezing, rhonchi or rales.  Musculoskeletal:     Right lower leg: No edema.     Left lower leg: No edema.  Skin:    General: Skin is warm and dry.     Findings: No rash.  Neurological:  Mental Status: She is alert.  Psychiatric:        Mood and Affect: Mood normal.        Behavior: Behavior normal.      Results for orders placed or performed in visit on 10/30/20  Renal function panel  Result Value Ref Range   Sodium 141 135 - 145 mEq/L   Potassium 3.5 3.5 - 5.1 mEq/L   Chloride 106 96 - 112 mEq/L   CO2 25 19 - 32 mEq/L   Albumin 4.0 3.5 - 5.2 g/dL   BUN 17 6 - 23 mg/dL   Creatinine, Ser 1.22 (H) 0.40 - 1.20 mg/dL   Glucose, Bld 99 70 - 99 mg/dL   Phosphorus 3.3 2.3 - 4.6 mg/dL   GFR 43.37 (L) >60.00 mL/min   Calcium 9.1 8.4 - 10.5 mg/dL  TSH  Result Value Ref Range   TSH 3.71 0.35 - 5.50 uIU/mL    Assessment & Plan:  This visit occurred during the SARS-CoV-2 public health emergency.  Safety protocols were in place, including screening questions prior to the visit, additional usage of staff PPE, and extensive  cleaning of exam room while observing appropriate contact time as indicated for disinfecting solutions.   Problem List Items Addressed This Visit     Adjustment disorder with mixed anxiety and depressed mood - Primary    Doing well on lower paxil 10mg  dose - will trial fully off antidepressant.       Hemorrhoid    Appreciate GI care - she has received first set of serial banding last month and has f/u planned next month. Encouraged regular metamucil use.       Hypothyroidism (acquired)    Stable period on lowest levothyroxine 63mcg dose. Continue this.       Other Visit Diagnoses     Need for influenza vaccination       Relevant Orders   Flu Vaccine QUAD High Dose(Fluad) (Completed)        Meds ordered this encounter  Medications   psyllium (METAMUCIL) 58.6 % packet    Sig: Take 1 packet by mouth daily as needed.   Orders Placed This Encounter  Procedures   Flu Vaccine QUAD High Dose(Fluad)     Patient Instructions  Flu shot today  Ok to stay off paroxetine at this time. Watch for returning mood trouble.  Continue levothyroxine 5mcg daily.  Schedule physical /wellness visit in 3 months   Follow up plan: Return in about 3 months (around 04/10/2021), or if symptoms worsen or fail to improve, for annual exam, prior fasting for blood work, [employer] wellness visit.  Ria Bush, MD

## 2021-01-08 NOTE — Assessment & Plan Note (Signed)
Appreciate GI care - she has received first set of serial banding last month and has f/u planned next month. Encouraged regular metamucil use.

## 2021-01-08 NOTE — Assessment & Plan Note (Signed)
Doing well on lower paxil 10mg  dose - will trial fully off antidepressant.

## 2021-01-25 ENCOUNTER — Encounter: Payer: Medicare PPO | Admitting: Gastroenterology

## 2021-02-08 ENCOUNTER — Encounter: Payer: Self-pay | Admitting: Gastroenterology

## 2021-02-08 ENCOUNTER — Ambulatory Visit (INDEPENDENT_AMBULATORY_CARE_PROVIDER_SITE_OTHER): Payer: Medicare PPO | Admitting: Gastroenterology

## 2021-02-08 DIAGNOSIS — K641 Second degree hemorrhoids: Secondary | ICD-10-CM | POA: Diagnosis not present

## 2021-02-08 NOTE — Progress Notes (Signed)
The patient tolerated her previous banding well without any pain or discomfort.  She is not sure if there is been a significant improvement after that first banding.  She is currently having some hemorrhoidal discomfort today.  PROCEDURE NOTE: The patient presents with symptomatic grade 2  hemorrhoids, requesting rubber band ligation of his/her hemorrhoidal disease.  All risks, benefits and alternative forms of therapy were described and informed consent was obtained.   The anorectum was pre-medicated with topical lidocaine (5%) and nitroglycerin (0.125%) The decision was made to band the left posterior internal hemorrhoid, and the [place] was used to perform band ligation without complication.  Digital anorectal examination was then performed to assure proper positioning of the band, and to adjust the banded tissue as required.  The patient was discharged home without pain or other issues.  Dietary and behavioral recommendations were given and along with follow-up instructions.     The following adjunctive treatments were recommended:  Daily fiber supplementation with Metamucil Adequate water intake Avoidance of straining with defecation and prolonged bowel movements  The patient will return in 2-4 weeks for  follow-up and possible additional banding as required. No complications were encountered and the patient tolerated the procedure well.

## 2021-02-08 NOTE — Patient Instructions (Signed)
If you are age 76 or older, your body mass index should be between 23-30. Your Body mass index is 29.44 kg/m. If this is out of the aforementioned range listed, please consider follow up with your Primary Care Provider.  If you are age 68 or younger, your body mass index should be between 19-25. Your Body mass index is 29.44 kg/m. If this is out of the aformentioned range listed, please consider follow up with your Primary Care Provider.   HEMORRHOID BANDING PROCEDURE    FOLLOW-UP CARE   The procedure you have had should have been relatively painless since the banding of the area involved does not have nerve endings and there is no pain sensation.  The rubber band cuts off the blood supply to the hemorrhoid and the band may fall off as soon as 48 hours after the banding (the band may occasionally be seen in the toilet bowl following a bowel movement). You may notice a temporary feeling of fullness in the rectum which should respond adequately to plain Tylenol or Motrin.  Following the banding, avoid strenuous exercise that evening and resume full activity the next day.  A sitz bath (soaking in a warm tub) or bidet is soothing, and can be useful for cleansing the area after bowel movements.     To avoid constipation, take two tablespoons of natural wheat bran, natural oat bran, flax, Benefiber or any over the counter fiber supplement and increase your water intake to 7-8 glasses daily.    Unless you have been prescribed anorectal medication, do not put anything inside your rectum for two weeks: No suppositories, enemas, fingers, etc.  Occasionally, you may have more bleeding than usual after the banding procedure.  This is often from the untreated hemorrhoids rather than the treated one.  Don't be concerned if there is a tablespoon or so of blood.  If there is more blood than this, lie flat with your bottom higher than your head and apply an ice pack to the area. If the bleeding does not stop  within a half an hour or if you feel faint, call our office at [phone] or go to the emergency room.  Problems are not common; however, if there is a substantial amount of bleeding, severe pain, chills, fever or difficulty passing urine (very rare) or other problems, you should call us at [phone] or report to the nearest emergency room.  Do not stay seated continuously for more than 2-3 hours for a day or two after the procedure.  Tighten your buttock muscles 10-15 times every two hours and take 10-15 deep breaths every 1-2 hours.  Do not spend more than a few minutes on the toilet if you cannot empty your bowel; instead re-visit the toilet at a later time.    Take Metamucil daily.  The [place] [facility] providers would like to encourage you to use [facility] to communicate with providers for non-urgent requests or questions.  Due to long hold times on the telephone, sending your provider a message by [facility] may be a faster and more efficient way to get a response.  Please allow 48 business hours for a response.  Please remember that this is for non-urgent requests.   It was a pleasure to see you today!  Thank you for trusting me with your gastrointestinal care!    Scott E.Candis Schatz, MD

## 2021-02-25 ENCOUNTER — Telehealth: Payer: Self-pay | Admitting: *Deleted

## 2021-02-25 NOTE — Telephone Encounter (Signed)
Noted  

## 2021-02-25 NOTE — Telephone Encounter (Signed)
Patient was given Home Care advice by Access Nurse.

## 2021-02-25 NOTE — Telephone Encounter (Signed)
PLEASE NOTE: All timestamps contained within this report are represented as [place] Standard Time. CONFIDENTIALTY NOTICE: This fax transmission is intended only for the addressee. It contains information that is legally privileged, confidential or otherwise protected from use or disclosure. If you are not the intended recipient, you are strictly prohibited from reviewing, disclosing, copying using or disseminating any of this information or taking any action in reliance on or regarding this information. If you have received this fax in error, please notify us immediately by telephone so that we can arrange for its return to [place]. Phone: [phone], Toll-Free: [phone], Fax: [phone] Page: 1 of 2 Call Id: [id number] [place] Night - Client TELEPHONE ADVICE RECORD AccessNurse Patient Name: [employer] [facility] [facility] Gender: Female DOB: Sep 17, 1945 Age: 75 Y 11 M 15 D Return Phone Number: [phone] (Primary), [phone] (Secondary) Address: City/ State/ Zip: [place] [address] Client [facility] Night - Client Client Site [place] Provider Ria Bush - MD Contact Type Call Who Is Calling Patient / Member / Family / Caregiver Call Type Triage / Clinical Relationship To Patient Self Return Phone Number [phone] (Primary) Chief Complaint Constipation Reason for Call Symptomatic / Request for [place] states she is constipated. Translation No Nurse Assessment Nurse: Marina Gravel, RN, Carmelia Bake Date/Time (Eastern Time): 02/22/2021 12:41:30 AM Confirm and document reason for call. If symptomatic, describe symptoms. ---Caller states she is constipated. Takes Citrucel with no relief. Does the patient have any new or worsening symptoms? ---Yes Will a triage be completed? ---Yes Related visit to physician within the last 2 weeks? ---No Does the PT have any chronic  conditions? (i.e. diabetes, asthma, this includes High risk factors for pregnancy, etc.) ---Yes List chronic conditions. ---Constipation anxiety depression Is this a behavioral health or substance abuse call? ---No Guidelines Guideline Title Affirmed Question Affirmed Notes Nurse Date/Time (Eastern Time) Constipation Over-The-Counter (OTC) medicines for constipation, questions about Lennie Odor 02/22/2021 12:45:20 AM Disp. Time [name] Time) Disposition Final User 02/22/2021 12:51:13 AM Home Care Yes Marina Gravel, RN, Carmelia Bake PLEASE NOTE: All timestamps contained within this report are represented as [place] Standard Time. CONFIDENTIALTY NOTICE: This fax transmission is intended only for the addressee. It contains information that is legally privileged, confidential or otherwise protected from use or disclosure. If you are not the intended recipient, you are strictly prohibited from reviewing, disclosing, copying using or disseminating any of this information or taking any action in reliance on or regarding this information. If you have received this fax in error, please notify us immediately by telephone so that we can arrange for its return to [place]. Phone: [phone], Toll-Free: [phone], Fax: [phone] Page: 2 of 2 Call Id: [id number] [place] Disagree/Comply Comply Caller Understands Yes PreDisposition Did not know what to do Care Advice Given Per Guideline HOME CARE: * You should be able to treat this at home. STEP 1 - FIBER LAXATIVES, EVERY DAY: * You can take a fiber laxative instead of eating more fiber. An example of a fiber laxative is PSYLLIUM (Metamucil). Fiber can help soften your stools. * Fiber works by holding more water in your stools. CALL BACK IF: * Constipation lasts more than 1 week after using Care Advice * Abdomen swelling, vomiting or fever occur * Constant or increasing abdomen pain * You think you need to be seen * You become worse CARE ADVICE given per  Constipation (Adult) guideline.

## 2021-02-27 ENCOUNTER — Other Ambulatory Visit: Payer: Self-pay | Admitting: Family Medicine

## 2021-02-27 MED ORDER — LEVOTHYROXINE SODIUM 25 MCG PO TABS: 25.0000 ug | ORAL_TABLET | Freq: Every day | ORAL | 0 refills | Status: DC

## 2021-02-27 NOTE — Telephone Encounter (Signed)
Spoke with pt asking about lorazepam request.  During the conversation, pt realized she was mistaken.  She needs levothyroxine refilled.  Confirms she is not taking lorazepam.  Notified pt I'm sending refill for levothyroxine. Pt expresses her thanks and apologizes for the mistake. Fyi to Dr. Darnell Level.   E-scribed levothyroxine refill.

## 2021-02-27 NOTE — Telephone Encounter (Signed)
We haven't filled lorazepam since 2020.  Why is pt requesting refill at this time?  We just stopped paroxetine (paxil) per pt preference last month.

## 2021-03-01 ENCOUNTER — Other Ambulatory Visit: Payer: Self-pay | Admitting: Family Medicine

## 2021-03-01 NOTE — Telephone Encounter (Signed)
Duplicate request.  Pt not taking med (see Refill note, 02/27/21).

## 2021-03-06 ENCOUNTER — Telehealth: Payer: Self-pay | Admitting: Family Medicine

## 2021-03-06 NOTE — Telephone Encounter (Signed)
Lvm asking pt to call back

## 2021-03-06 NOTE — Telephone Encounter (Signed)
Pt called wanting to know why she was taken off the Paroxetine at her last appt but refills were sent in. She said this is the only thing that helps her sleep  Paroxetine refill date 02/26/21

## 2021-03-07 NOTE — Telephone Encounter (Signed)
According to OV notes, 01/08/21, pt was to do a trial fully off paroxetine (Paxil).  Lvm asking pt to call back.  Need to find out if pt has restarted med.

## 2021-03-08 ENCOUNTER — Encounter: Payer: Self-pay | Admitting: Gastroenterology

## 2021-03-08 ENCOUNTER — Ambulatory Visit (INDEPENDENT_AMBULATORY_CARE_PROVIDER_SITE_OTHER): Payer: Medicare PPO | Admitting: Gastroenterology

## 2021-03-08 VITALS — BP 120/64 | HR 76 | Ht 63.0 in | Wt 159.1 lb

## 2021-03-08 DIAGNOSIS — K641 Second degree hemorrhoids: Secondary | ICD-10-CM | POA: Diagnosis not present

## 2021-03-08 NOTE — Patient Instructions (Signed)
If you are age 76 or older, your body mass index should be between 23-30. Your Body mass index is 28.19 kg/m. If this is out of the aforementioned range listed, please consider follow up with your Primary Care Provider.  If you are age 77 or younger, your body mass index should be between 19-25. Your Body mass index is 28.19 kg/m. If this is out of the aformentioned range listed, please consider follow up with your Primary Care Provider.   HEMORRHOID BANDING PROCEDURE    FOLLOW-UP CARE   The procedure you have had should have been relatively painless since the banding of the area involved does not have nerve endings and there is no pain sensation.  The rubber band cuts off the blood supply to the hemorrhoid and the band may fall off as soon as 48 hours after the banding (the band may occasionally be seen in the toilet bowl following a bowel movement). You may notice a temporary feeling of fullness in the rectum which should respond adequately to plain Tylenol or Motrin.  Following the banding, avoid strenuous exercise that evening and resume full activity the next day.  A sitz bath (soaking in a warm tub) or bidet is soothing, and can be useful for cleansing the area after bowel movements.     To avoid constipation, take two tablespoons of natural wheat bran, natural oat bran, flax, Benefiber or any over the counter fiber supplement and increase your water intake to 7-8 glasses daily.    Unless you have been prescribed anorectal medication, do not put anything inside your rectum for two weeks: No suppositories, enemas, fingers, etc.  Occasionally, you may have more bleeding than usual after the banding procedure.  This is often from the untreated hemorrhoids rather than the treated one.  Don't be concerned if there is a tablespoon or so of blood.  If there is more blood than this, lie flat with your bottom higher than your head and apply an ice pack to the area. If the bleeding does not stop  within a half an hour or if you feel faint, call our office at [phone] or go to the emergency room.  Problems are not common; however, if there is a substantial amount of bleeding, severe pain, chills, fever or difficulty passing urine (very rare) or other problems, you should call us at [phone] or report to the nearest emergency room.  Do not stay seated continuously for more than 2-3 hours for a day or two after the procedure.  Tighten your buttock muscles 10-15 times every two hours and take 10-15 deep breaths every 1-2 hours.  Do not spend more than a few minutes on the toilet if you cannot empty your bowel; instead re-visit the toilet at a later time.      The [place] [facility] providers would like to encourage you to use [facility] to communicate with providers for non-urgent requests or questions.  Due to long hold times on the telephone, sending your provider a message by [facility] may be a faster and more efficient way to get a response.  Please allow 48 business hours for a response.  Please remember that this is for non-urgent requests.   It was a pleasure to see you today!  Thank you for trusting me with your gastrointestinal care!    Scott E.Candis Schatz, MD

## 2021-03-08 NOTE — Telephone Encounter (Signed)
Spoke with pt about med.  Says she knew she was not taking med but the pharmacy had refilled it.  I suggested she may be on auto refill with [facility] and the filled it for her.  Recommended pt contact [facility] and let them know when she needs it refilled.  Pt verbalizes understanding and expresses her thanks.

## 2021-03-08 NOTE — Telephone Encounter (Signed)
Pt called returning your call 

## 2021-03-08 NOTE — Progress Notes (Signed)
PROCEDURE NOTE: The patient presents with symptomatic grade 2  hemorrhoids, requesting rubber band ligation of his/her hemorrhoidal disease.  All risks, benefits and alternative forms of therapy were described and informed consent was obtained.   The anorectum was pre-medicated with topical lidocaine (5%) and nitroglycerin (0.125%) The decision was made to band the right anterior internal hemorrhoid, and the [place] was used to perform band ligation without complication.  Digital anorectal examination was then performed to assure proper positioning of the band, and to adjust the banded tissue as required.  Once the band was deployed the patient experienced significant discomfort, although it was not described as sharp pain.  After a few minutes, the discomfort was not improving and she requested the band be removed.  The band was removed by gently rolling the rubber band upwards.  The patient's discomfort improved following this.  She elected not to repeat attempt at banding at this time.  The patient was discharged home without pain or other issues.  Dietary and behavioral recommendations were given and along with follow-up instructions.  She was advised to seek further banding based on her symptoms, but was not scheduled for further banding at this time.  She was recommended to continue fiber supplementation indefinitely.

## 2021-03-08 NOTE — Telephone Encounter (Signed)
According to OV notes, 01/08/21, pt was to do a trial fully off paroxetine (Paxil).  Lvm asking pt to call back.  Need to find out if pt has restarted med.

## 2021-03-09 DIAGNOSIS — R52 Pain, unspecified: Secondary | ICD-10-CM | POA: Diagnosis not present

## 2021-03-22 ENCOUNTER — Other Ambulatory Visit: Payer: Self-pay | Admitting: Family Medicine

## 2021-03-22 NOTE — Telephone Encounter (Signed)
AWV scheduled for 04/24/21, last filled on 11/16/20 #30 tabs with 5 refills

## 2021-03-23 NOTE — Telephone Encounter (Signed)
ERx 
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
## 2021-03-29 ENCOUNTER — Telehealth: Payer: Self-pay

## 2021-03-29 ENCOUNTER — Ambulatory Visit: Payer: Medicare PPO

## 2021-03-29 NOTE — Telephone Encounter (Signed)
Patient called she missed her phone visit with nurse. She states that she does not not need she is doing fine. She did want to let you know know that she is taking the Paroxetine 1-2 times a month to help her sleep. Wanted to make sure that was ok with you.

## 2021-03-29 NOTE — Telephone Encounter (Addendum)
Noted. I imaging she means hydroxyzine. That should be fine.

## 2021-03-29 NOTE — Telephone Encounter (Signed)
Made several attempts to contact patients in regards to annual wellness visit today @ 2:00pm. Left vm message on patients cell and home number. Advised patient of today's appointment and to contact the office to reschedule when available. TM

## 2021-04-02 NOTE — Telephone Encounter (Signed)
Tried calling the patient and she did not answer. LVM for patient to call back.

## 2021-04-10 NOTE — Telephone Encounter (Signed)
Spoke with pt relaying Dr. Synthia Innocent message.  Pt apologizes and says yes, she is hydroxyzine.
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
## 2021-04-16 ENCOUNTER — Ambulatory Visit (INDEPENDENT_AMBULATORY_CARE_PROVIDER_SITE_OTHER): Payer: Medicare PPO

## 2021-04-16 VITALS — Ht 62.0 in | Wt 159.0 lb

## 2021-04-16 DIAGNOSIS — Z0000 Encounter for general adult medical examination without abnormal findings: Secondary | ICD-10-CM

## 2021-04-16 NOTE — Patient Instructions (Addendum)
Karen [name] , Thank you for taking time to complete your Medicare Wellness Visit. I appreciate your ongoing commitment to your health goals. Please review the following plan we discussed and let me know if I can assist you in the future.   Screening recommendations/referrals: Colonoscopy: no longer required  Mammogram: due, last completed 05/28/18 Bone Density: due, last completed 12/29/12, discuss with PCP when ready to schedule Recommended yearly ophthalmology/optometry visit for glaucoma screening and checkup Recommended yearly dental visit for hygiene and checkup  Vaccinations: Influenza vaccine: up to date Pneumococcal vaccine: up to date Tdap vaccine: up to date, completed 07/30/16, due 07/31/26 Shingles vaccine: Discuss with your local pharmacy   Covid-19:newest booster available a your local pharmacy  Advanced directives: Please bring a copy of Living Will and/or [place] for your chart.   Conditions/risks identified: see problem list  Next appointment: Follow up in one year for your annual wellness visit 04/17/22 @ 2:00pm, this will be a telephone visit.    Preventive Care 77 Years and Older, Female Preventive care refers to lifestyle choices and visits with your health care provider that can promote health and wellness. What does preventive care include? A yearly physical exam. This is also called an annual well check. Dental exams once or twice a year. Routine eye exams. Ask your health care provider how often you should have your eyes checked. Personal lifestyle choices, including: Daily care of your teeth and gums. Regular physical activity. Eating a healthy diet. Avoiding tobacco and drug use. Limiting alcohol use. Practicing safe sex. Taking low-dose aspirin every day. Taking vitamin and mineral supplements as recommended by your health care provider. What happens during an annual well check? The services and screenings done by your health care  provider during your annual well check will depend on your age, overall health, lifestyle risk factors, and family history of disease. Counseling  Your health care provider may ask you questions about your: Alcohol use. Tobacco use. Drug use. Emotional well-being. Home and relationship well-being. Sexual activity. Eating habits. History of falls. Memory and ability to understand (cognition). Work and work Statistician. Reproductive health. Screening  You may have the following tests or measurements: Height, weight, and BMI. Blood pressure. Lipid and cholesterol levels. These may be checked every 5 years, or more frequently if you are over 77 years old. Skin check. Lung cancer screening. You may have this screening every year starting at age 77 if you have a 30-pack-year history of smoking and currently smoke or have quit within the past 15 years. Fecal occult blood test (FOBT) of the stool. You may have this test every year starting at age 77. Flexible sigmoidoscopy or colonoscopy. You may have a sigmoidoscopy every 5 years or a colonoscopy every 10 years starting at age 77. Hepatitis C blood test. Hepatitis B blood test. Sexually transmitted disease (STD) testing. Diabetes screening. This is done by checking your blood sugar (glucose) after you have not eaten for a while (fasting). You may have this done every 1-3 years. Bone density scan. This is done to screen for osteoporosis. You may have this done starting at age 77. Mammogram. This may be done every 1-2 years. Talk to your health care provider about how often you should have regular mammograms. Talk with your health care provider about your test results, treatment options, and if necessary, the need for more tests. Vaccines  Your health care provider may recommend certain vaccines, such as: Influenza vaccine. This is recommended every year.  Tetanus, diphtheria, and acellular pertussis (Tdap, Td) vaccine. You may need a Td  booster every 10 years. Zoster vaccine. You may need this after age 77. Pneumococcal 13-valent conjugate (PCV13) vaccine. One dose is recommended after age 77. Pneumococcal polysaccharide (PPSV23) vaccine. One dose is recommended after age 77. Talk to your health care provider about which screenings and vaccines you need and how often you need them. This information is not intended to replace advice given to you by your health care provider. Make sure you discuss any questions you have with your health care provider. Document Released: 04/13/2015 Document Revised: 12/05/2015 Document Reviewed: 01/16/2015 Elsevier Interactive Patient Education  2017 [place] Prevention in the Home Falls can cause injuries. They can happen to people of all ages. There are many things you can do to make your home safe and to help prevent falls. What can I do on the outside of my home? Regularly fix the edges of walkways and driveways and fix any cracks. Remove anything that might make you trip as you walk through a door, such as a raised step or threshold. Trim any bushes or trees on the path to your home. Use bright outdoor lighting. Clear any walking paths of anything that might make someone trip, such as rocks or tools. Regularly check to see if handrails are loose or broken. Make sure that both sides of any steps have handrails. Any raised decks and porches should have guardrails on the edges. Have any leaves, snow, or ice cleared regularly. Use sand or salt on walking paths during winter. Clean up any spills in your garage right away. This includes oil or grease spills. What can I do in the bathroom? Use night lights. Install grab bars by the toilet and in the tub and shower. Do not use towel bars as grab bars. Use non-skid mats or decals in the tub or shower. If you need to sit down in the shower, use a plastic, non-slip stool. Keep the floor dry. Clean up any water that spills on the floor  as soon as it happens. Remove soap buildup in the tub or shower regularly. Attach bath mats securely with double-sided non-slip rug tape. Do not have throw rugs and other things on the floor that can make you trip. What can I do in the bedroom? Use night lights. Make sure that you have a light by your bed that is easy to reach. Do not use any sheets or blankets that are too big for your bed. They should not hang down onto the floor. Have a firm chair that has side arms. You can use this for support while you get dressed. Do not have throw rugs and other things on the floor that can make you trip. What can I do in the kitchen? Clean up any spills right away. Avoid walking on wet floors. Keep items that you use a lot in easy-to-reach places. If you need to reach something above you, use a strong step stool that has a grab bar. Keep electrical cords out of the way. Do not use floor polish or wax that makes floors slippery. If you must use wax, use non-skid floor wax. Do not have throw rugs and other things on the floor that can make you trip. What can I do with my stairs? Do not leave any items on the stairs. Make sure that there are handrails on both sides of the stairs and use them. Fix handrails that are broken or loose.  Make sure that handrails are as long as the stairways. Check any carpeting to make sure that it is firmly attached to the stairs. Fix any carpet that is loose or worn. Avoid having throw rugs at the top or bottom of the stairs. If you do have throw rugs, attach them to the floor with carpet tape. Make sure that you have a light switch at the top of the stairs and the bottom of the stairs. If you do not have them, ask someone to add them for you. What else can I do to help prevent falls? Wear shoes that: Do not have high heels. Have rubber bottoms. Are comfortable and fit you well. Are closed at the toe. Do not wear sandals. If you use a stepladder: Make sure that it is  fully opened. Do not climb a closed stepladder. Make sure that both sides of the stepladder are locked into place. Ask someone to hold it for you, if possible. Clearly mark and make sure that you can see: Any grab bars or handrails. First and last steps. Where the edge of each step is. Use tools that help you move around (mobility aids) if they are needed. These include: Canes. Walkers. Scooters. Crutches. Turn on the lights when you go into a dark area. Replace any light bulbs as soon as they burn out. Set up your furniture so you have a clear path. Avoid moving your furniture around. If any of your floors are uneven, fix them. If there are any pets around you, be aware of where they are. Review your medicines with your doctor. Some medicines can make you feel dizzy. This can increase your chance of falling. Ask your doctor what other things that you can do to help prevent falls. This information is not intended to replace advice given to you by your health care provider. Make sure you discuss any questions you have with your health care provider. Document Released: 01/11/2009 Document Revised: 08/23/2015 Document Reviewed: 04/21/2014 Elsevier Interactive Patient Education  2017 [employer].

## 2021-04-17 ENCOUNTER — Other Ambulatory Visit (INDEPENDENT_AMBULATORY_CARE_PROVIDER_SITE_OTHER): Payer: Medicare PPO

## 2021-04-17 ENCOUNTER — Other Ambulatory Visit: Payer: Self-pay

## 2021-04-17 DIAGNOSIS — E786 Lipoprotein deficiency: Secondary | ICD-10-CM | POA: Diagnosis not present

## 2021-04-17 DIAGNOSIS — E039 Hypothyroidism, unspecified: Secondary | ICD-10-CM

## 2021-04-17 DIAGNOSIS — E559 Vitamin D deficiency, unspecified: Secondary | ICD-10-CM

## 2021-04-17 DIAGNOSIS — R197 Diarrhea, unspecified: Secondary | ICD-10-CM | POA: Diagnosis not present

## 2021-04-17 LAB — COMPREHENSIVE METABOLIC PANEL
ALT: 13 U/L (ref 0–35)
AST: 16 U/L (ref 0–37)
Albumin: 4 g/dL (ref 3.5–5.2)
Alkaline Phosphatase: 99 U/L (ref 39–117)
BUN: 13 mg/dL (ref 6–23)
CO2: 25 mEq/L (ref 19–32)
Calcium: 8.8 mg/dL (ref 8.4–10.5)
Chloride: 109 mEq/L (ref 96–112)
Creatinine, Ser: 1.14 mg/dL (ref 0.40–1.20)
GFR: 46.89 mL/min — ABNORMAL LOW (ref >60.00)
Glucose, Bld: 108 mg/dL — ABNORMAL HIGH (ref 70–99)
Potassium: 3.8 mEq/L (ref 3.5–5.1)
Sodium: 141 mEq/L (ref 135–145)
Total Bilirubin: 0.6 mg/dL (ref 0.2–1.2)
Total Protein: 6.3 g/dL (ref 6.0–8.3)

## 2021-04-17 LAB — LIPID PANEL
Cholesterol: 209 mg/dL — ABNORMAL HIGH (ref 0–200)
HDL: 56.6 mg/dL (ref >39.00)
LDL Cholesterol: 139 mg/dL — ABNORMAL HIGH (ref 0–99)
NonHDL: 152.47
Total CHOL/HDL Ratio: 4
Triglycerides: 65 mg/dL (ref 0.0–149.0)
VLDL: 13 mg/dL (ref 0.0–40.0)

## 2021-04-17 LAB — CBC WITH DIFFERENTIAL/PLATELET
Basophils Absolute: 0 10*3/uL (ref 0.0–0.1)
Basophils Relative: 0.8 % (ref 0.0–3.0)
Eosinophils Absolute: 0.1 10*3/uL (ref 0.0–0.7)
Eosinophils Relative: 2.9 % (ref 0.0–5.0)
HCT: 42.5 % (ref 36.0–46.0)
Hemoglobin: 14 g/dL (ref 12.0–15.0)
Lymphocytes Relative: 33.6 % (ref 12.0–46.0)
Lymphs Abs: 1.6 10*3/uL (ref 0.7–4.0)
MCHC: 33 g/dL (ref 30.0–36.0)
MCV: 86.5 fl (ref 78.0–100.0)
Monocytes Absolute: 0.3 10*3/uL (ref 0.1–1.0)
Monocytes Relative: 5.7 % (ref 3.0–12.0)
Neutro Abs: 2.7 10*3/uL (ref 1.4–7.7)
Neutrophils Relative %: 57 % (ref 43.0–77.0)
Platelets: 174 10*3/uL (ref 150.0–400.0)
RBC: 4.91 Mil/uL (ref 3.87–5.11)
RDW: 13.7 % (ref 11.5–15.5)
WBC: 4.7 10*3/uL (ref 4.0–10.5)

## 2021-04-17 LAB — VITAMIN D 25 HYDROXY (VIT D DEFICIENCY, FRACTURES): VITD: 23.14 ng/mL — ABNORMAL LOW (ref 30.00–100.00)

## 2021-04-17 LAB — TSH: TSH: 5.36 u[IU]/mL (ref 0.35–5.50)

## 2021-04-17 LAB — T4, FREE: Free T4: 0.6 ng/dL (ref 0.60–1.60)

## 2021-04-17 NOTE — Addendum Note (Signed)
Addended by: Ellamae Sia on: 04/17/2021 02:26 PM   Modules accepted: Orders

## 2021-04-17 NOTE — Addendum Note (Signed)
Addended by: Ellamae Sia on: 04/17/2021 09:02 AM   Modules accepted: Orders

## 2021-04-24 ENCOUNTER — Encounter: Payer: Self-pay | Admitting: Family Medicine

## 2021-04-24 ENCOUNTER — Ambulatory Visit (INDEPENDENT_AMBULATORY_CARE_PROVIDER_SITE_OTHER): Payer: Medicare PPO | Admitting: Family Medicine

## 2021-04-24 ENCOUNTER — Other Ambulatory Visit: Payer: Self-pay

## 2021-04-24 VITALS — BP 122/74 | HR 74 | Temp 98.2°F | Ht 62.5 in | Wt 152.4 lb

## 2021-04-24 DIAGNOSIS — Z7189 Other specified counseling: Secondary | ICD-10-CM

## 2021-04-24 DIAGNOSIS — F4323 Adjustment disorder with mixed anxiety and depressed mood: Secondary | ICD-10-CM

## 2021-04-24 DIAGNOSIS — N1831 Chronic kidney disease, stage 3a: Secondary | ICD-10-CM

## 2021-04-24 DIAGNOSIS — E7800 Pure hypercholesterolemia, unspecified: Secondary | ICD-10-CM | POA: Diagnosis not present

## 2021-04-24 DIAGNOSIS — K641 Second degree hemorrhoids: Secondary | ICD-10-CM | POA: Diagnosis not present

## 2021-04-24 DIAGNOSIS — K5909 Other constipation: Secondary | ICD-10-CM

## 2021-04-24 DIAGNOSIS — M8588 Other specified disorders of bone density and structure, other site: Secondary | ICD-10-CM

## 2021-04-24 DIAGNOSIS — Z0000 Encounter for general adult medical examination without abnormal findings: Secondary | ICD-10-CM

## 2021-04-24 DIAGNOSIS — E559 Vitamin D deficiency, unspecified: Secondary | ICD-10-CM

## 2021-04-24 DIAGNOSIS — E039 Hypothyroidism, unspecified: Secondary | ICD-10-CM | POA: Diagnosis not present

## 2021-04-24 NOTE — Assessment & Plan Note (Signed)
Levels remain borderline low on lowest levothyroxine dose - she feels well at this dose.

## 2021-04-24 NOTE — Assessment & Plan Note (Addendum)
Overdue for bone density scan.  Will reorder, # provided to call and schedule.  Continue calcium and vitamin D and regular weight bearing exercise - walking 1+ mile daily.

## 2021-04-24 NOTE — Assessment & Plan Note (Signed)
Stable period generally off paxil only on hydroxyzine PRN - continue.

## 2021-04-24 NOTE — Patient Instructions (Addendum)
Return to see Dr Candis Schatz if ongoing hemorrhoid trouble.  Call to schedule mammogram and bone density scan at your convenience: [place] [phone] If interested, check with pharmacy about new 2 shot shingles series (shingrix).  Check at home on living will status. Advanced directive packet provided today.  Return as needed or in 4-6 months for follow up visit.   Health Maintenance After Age 77 After age 61, you are at a higher risk for certain long-term diseases and infections as well as injuries from falls. Falls are a major cause of broken bones and head injuries in people who are older than age 47. Getting regular preventive care can help to keep you healthy and well. Preventive care includes getting regular testing and making lifestyle changes as recommended by your health care provider. Talk with your health care provider about: Which screenings and tests you should have. A screening is a test that checks for a disease when you have no symptoms. A diet and exercise plan that is right for you. What should I know about screenings and tests to prevent falls? Screening and testing are the best ways to find a health problem early. Early diagnosis and treatment give you the best chance of managing medical conditions that are common after age 69. Certain conditions and lifestyle choices may make you more likely to have a fall. Your health care provider may recommend: Regular vision checks. Poor vision and conditions such as cataracts can make you more likely to have a fall. If you wear glasses, make sure to get your prescription updated if your vision changes. Medicine review. Work with your health care provider to regularly review all of the medicines you are taking, including over-the-counter medicines. Ask your health care provider about any side effects that may make you more likely to have a fall. Tell your health care provider if any medicines that you take make you feel  dizzy or sleepy. Strength and balance checks. Your health care provider may recommend certain tests to check your strength and balance while standing, walking, or changing positions. Foot health exam. Foot pain and numbness, as well as not wearing proper footwear, can make you more likely to have a fall. Screenings, including: Osteoporosis screening. Osteoporosis is a condition that causes the bones to get weaker and break more easily. Blood pressure screening. Blood pressure changes and medicines to control blood pressure can make you feel dizzy. Depression screening. You may be more likely to have a fall if you have a fear of falling, feel depressed, or feel unable to do activities that you used to do. Alcohol use screening. Using too much alcohol can affect your balance and may make you more likely to have a fall. Follow these instructions at home: Lifestyle Do not drink alcohol if: Your health care provider tells you not to drink. If you drink alcohol: Limit how much you have to: 0-1 drink a day for women. 0-2 drinks a day for men. Know how much alcohol is in your drink. In the [place] one drink equals one 12 oz bottle of beer (355 mL), one 5 oz glass of wine (148 mL), or one 1 oz glass of hard liquor (44 mL). Do not use any products that contain nicotine or tobacco. These products include cigarettes, chewing tobacco, and vaping devices, such as e-cigarettes. If you need help quitting, ask your health care provider. Activity  Follow a regular exercise program to stay fit. This will help you maintain your balance.  Ask your health care provider what types of exercise are appropriate for you. If you need a cane or walker, use it as recommended by your health care provider. Wear supportive shoes that have nonskid soles. Safety  Remove any tripping hazards, such as rugs, cords, and clutter. Install safety equipment such as grab bars in bathrooms and safety rails on stairs. Keep rooms and  walkways well-lit. General instructions Talk with your health care provider about your risks for falling. Tell your health care provider if: You fall. Be sure to tell your health care provider about all falls, even ones that seem minor. You feel dizzy, tiredness (fatigue), or off-balance. Take over-the-counter and prescription medicines only as told by your health care provider. These include supplements. Eat a healthy diet and maintain a healthy weight. A healthy diet includes low-fat dairy products, low-fat (lean) meats, and fiber from whole grains, beans, and lots of fruits and vegetables. Stay current with your vaccines. Schedule regular health, dental, and eye exams. Summary Having a healthy lifestyle and getting preventive care can help to protect your health and wellness after age 3. Screening and testing are the best way to find a health problem early and help you avoid having a fall. Early diagnosis and treatment give you the best chance for managing medical conditions that are more common for people who are older than age 77. Falls are a major cause of broken bones and head injuries in people who are older than age 63. Take precautions to prevent a fall at home. Work with your health care provider to learn what changes you can make to improve your health and wellness and to prevent falls. This information is not intended to replace advice given to you by your health care provider. Make sure you discuss any questions you have with your health care provider. Document Revised: 08/06/2020 Document Reviewed: 08/06/2020 Elsevier Patient Education  [employer].

## 2021-04-24 NOTE — Progress Notes (Signed)
Patient ID: Karen [name], female    DOB: 02-Jul-1945, 76 y.o.   MRN: [id number]  This visit was conducted in person.  BP 122/74    Pulse 74    Temp 98.2 F (36.8 C) (Temporal)    Ht 5' 2.5" (1.588 m)    Wt 152 lb 7 oz (69.1 kg)    LMP  (LMP Unknown)    SpO2 95%    BMI 27.44 kg/m    CC: CPE Subjective:   HPI: Karen [name] is a 76 y.o. female presenting on 04/24/2021 for Annual Exam (MCR prt 2. )   Saw health advisor earlier this month for medicare wellness visit. Note reviewed.    No results found.  Flowsheet Row Clinical Support from 04/16/2021 in [facility]  PHQ-2 Total Score 0       Fall Risk  04/16/2021 03/28/2020 03/16/2019 03/12/2018 02/17/2017  Falls in the past year? 1 1 1  0 No  Comment - fell off table - - -  Number falls in past yr: 0 0 0 - -  Injury with Fall? 1 1 1  - -  Comment - broke her shoulder - - -  Risk for fall due to : Other (Comment) History of fall(s) - - -  Risk for fall due to: Comment fell off table - - - -  Follow up Falls prevention discussed Falls evaluation completed;Falls prevention discussed - - -  Fall 01/2020. No falls this year.   Hospitalized 11/2018 with urosepsis (largely asymptomatic) with SVT and elevated cardiac enzymes due to demand inschemia. Abnormal EKG showing LAD, poor R wave progression, LAFB. Echocardiogram overall reassuring (impaired LV relaxation).   Humeral fracture s/p ORIF 01/2020 (Dr Georga Kaufmann).   Saw GI Dr Candis Schatz for prolapsing hemorrhoids s/p serial hemorrhoid banding, rec daily metamucil.   Last visit we stopped paroxetine. She states she's still intermittently taking this. Discussed this works best when taking regularly. She doesn't feel she needs to take at this time.  She continues hydroxyzine PRN with benefit.  She continues levothyroxine 52mcg daily.   Fhmx CAD. Older sister with dementia.   Preventative: COLONOSCOPY 12/2014 - TA, diverticulosis, ext and int hemorrhoids, rpt 5 yrs  ([name]) --> now established with Dr Am[facility], he recommended rpt colonoscopy 7-10 yrs.  Mammogram 05/2018 birads1 -due for repeat, # provided  Well woman - s/p hysterectomy for benign reasons. Ovaries removed as well.  DEXA Date: 11/2012 T score -2.4 AP spine.  Lung cancer screening - not eligible  Flu shot yearly  COVID vaccine Pfizer 05/2019, 05/2019, declines booster  Pneumovax 2014, [id number]-17 2015  Tdap 07/2016  Zostavax 2012  Shingrix - discussed  Advanced directives: thinks she has one at home. Asked to bring me copy. Doesn't want prolonged life support if terminal condition "don't want to be kept alive", ok for reversible condition. HCPOA is Karen [name] son then Karen [name] daughter but this has not been updated.  Seat belt use discussed.  Sunscreen use discussed. No changing moles on skin. 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 Interim medical history since our last visit reviewed. Allergies and medications reviewed and updated. Outpatient Medications Prior to Visit  Medication Sig Dispense Refill   acetaminophen (TYLENOL) 500 MG tablet Take 1 tablet (500 mg total) by mouth every 6 (six) hours as needed.     calcium-vitamin D (OSCAL WITH D) 500-200 MG-UNIT tablet Take 1 tablet by mouth 3 (three) times daily. 90 tablet 6   Cholecalciferol (VITAMIN D3) 50 MCG (2000 UT) TABS Take 2,000 Units by mouth daily.     hydrOXYzine (ATARAX) 10 MG tablet TAKE 1 TO 2 TABLETS BY MOUTH TWICE A DAY AS NEEDED FOR ANXIETY 30 tablet 5    levothyroxine (SYNTHROID) 25 MCG tablet Take 1 tablet (25 mcg total) by mouth daily. 90 tablet 0   Multiple Vitamin (MULTIVITAMIN WITH MINERALS) TABS tablet Take 1 tablet by mouth daily. V[employer] Women Multivitamn     psyllium (METAMUCIL) 58.6 % packet Take 1 packet by mouth daily as needed.     vitamin E 180 MG (400 UNITS) capsule Take 400 Units by mouth daily.     No facility-administered medications prior to visit.     Per HPI unless specifically indicated in ROS section below Review of Systems  Constitutional:  Negative for activity change, appetite change, chills, fatigue, fever and unexpected weight change.  HENT:  Negative for hearing loss.   Eyes:  Negative for visual disturbance.  Respiratory:  Negative for cough, chest tightness, shortness of breath and wheezing.   Cardiovascular:  Negative for chest pain, palpitations and leg swelling.  Gastrointestinal:  Positive for blood in stool (hemorrhoid related). Negative for abdominal distention, abdominal pain, constipation, diarrhea, nausea and vomiting.  Genitourinary:  Negative for difficulty urinating and hematuria.  Musculoskeletal:  Negative for arthralgias, myalgias and neck pain.  Skin:  Negative for rash.  Neurological:  Negative for dizziness, seizures, syncope and headaches.  Hematological:  Negative for adenopathy. Does not bruise/bleed easily.  Psychiatric/Behavioral:  Negative for dysphoric mood. The patient is not nervous/anxious.    Objective:  BP 122/74    Pulse 74    Temp 98.2 F (36.8 C) (Temporal)    Ht 5' 2.5" (1.588 m)    Wt 152 lb 7 oz (69.1 kg)    LMP  (LMP Unknown)    SpO2 95%    BMI 27.44 kg/m   Wt Readings from Last 3 Encounters:  04/24/21 152 lb 7 oz (69.1 kg)  04/16/21 159 lb (72.1 kg)  03/08/21 159 lb 2 oz (72.2 kg)      Physical Exam Vitals and nursing note reviewed.  Constitutional:      Appearance: Normal appearance. She is not ill-appearing.  HENT:     Head: Normocephalic and atraumatic.      Right Ear: Tympanic membrane, ear canal and external ear normal. There is no impacted cerumen.     Left Ear: Tympanic membrane, ear canal and external ear normal. There is no impacted cerumen.  Eyes:     General:        Right eye: No discharge.        Left eye: No discharge.     Extraocular Movements: Extraocular movements intact.     Conjunctiva/sclera: Conjunctivae normal.     Pupils: Pupils are equal, round, and reactive to light.  Neck:     Thyroid: No thyroid mass or thyromegaly.     Vascular: No carotid bruit.  Cardiovascular:  Rate and Rhythm: Normal rate and regular rhythm.     Pulses: Normal pulses.     Heart sounds: Normal heart sounds. No murmur heard. Pulmonary:     Effort: Pulmonary effort is normal. No respiratory distress.     Breath sounds: Normal breath sounds. No wheezing, rhonchi or rales.  Abdominal:     General: Bowel sounds are normal. There is no distension.     Palpations: Abdomen is soft. There is no mass.     Tenderness: There is no abdominal tenderness. There is no guarding or rebound.     Hernia: No hernia is present.  Musculoskeletal:     Cervical back: Normal range of motion and neck supple. No rigidity.     Right lower leg: No edema.     Left lower leg: No edema.  Lymphadenopathy:     Cervical: No cervical adenopathy.  Skin:    General: Skin is warm and dry.     Findings: No rash.  Neurological:     General: No focal deficit present.     Mental Status: She is alert. Mental status is at baseline.  Psychiatric:        Mood and Affect: Mood normal.        Behavior: Behavior normal.      Results for orders placed or performed in visit on 04/17/21  VITAMIN D 25 Hydroxy (Vit-D Deficiency, Fractures)  Result Value Ref Range   VITD 23.14 (L) 30.00 - 100.00 ng/mL  TSH  Result Value Ref Range   TSH 5.36 0.35 - 5.50 uIU/mL  Lipid panel  Result Value Ref Range   Cholesterol 209 (H) 0 - 200 mg/dL   Triglycerides 65.0 0.0 - 149.0 mg/dL   HDL  56.60 >39.00 mg/dL   VLDL 13.0 0.0 - 40.0 mg/dL   LDL Cholesterol 139 (H) 0 - 99 mg/dL   Total CHOL/HDL Ratio 4    NonHDL 152.47    Lab Results  Component Value Date   CREATININE 1.14 04/17/2021   BUN 13 04/17/2021   NA 141 04/17/2021   K 3.8 04/17/2021   CL 109 04/17/2021   CO2 25 04/17/2021  GFR 47 Lab Results  Component Value Date   WBC 4.7 04/17/2021   HGB 14.0 04/17/2021   HCT 42.5 04/17/2021   MCV 86.5 04/17/2021   PLT 174.0 04/17/2021    Depression screen PHQ 2/9 04/16/2021 04/02/2020 03/28/2020 03/16/2019 03/12/2018  Decreased Interest 0 0 0 0 0  Down, Depressed, Hopeless 0 0 0 0 0  PHQ - 2 Score 0 0 0 0 0  Altered sleeping - 0 0 1 -  Tired, decreased energy - 0 0 0 -  Change in appetite - 0 0 0 -  Feeling bad or failure about yourself  - 0 0 0 -  Trouble concentrating - 0 0 0 -  Moving slowly or fidgety/restless - 0 0 0 -  Suicidal thoughts - 0 0 0 -  PHQ-9 Score - 0 0 1 -  Difficult doing work/chores - Not difficult at all Not difficult at all - -  Some recent data might be hidden    GAD 7 : Generalized Anxiety Score 03/16/2019  Nervous, Anxious, on Edge 0  Control/stop worrying 0  Worry too much - different things 0  Trouble relaxing 0  Restless 0  Easily annoyed or irritable 0  Afraid - awful might happen 0  Total GAD 7 Score 0   Assessment & Plan:  This visit  occurred during the SARS-CoV-2 public health emergency.  Safety protocols were in place, including screening questions prior to the visit, additional usage of staff PPE, and extensive cleaning of exam room while observing appropriate contact time as indicated for disinfecting solutions.   Problem List Items Addressed This Visit     Advanced care planning/counseling discussion (Chronic)    Advanced directives: thinks she has one at home. Asked to bring me copy. Doesn't want prolonged life support if terminal condition "don't want to be kept alive", ok for reversible condition. HCPOA is Karen [name] son then  Karen [name] daughter but this has not been updated.       Health maintenance examination - Primary (Chronic)    Preventative protocols reviewed and updated unless pt declined. Discussed healthy diet and lifestyle.  # provided to schedule mammogram and dexa as overdue for both       Adjustment disorder with mixed anxiety and depressed mood    Stable period generally off paxil only on hydroxyzine PRN - continue.       Osteopenia    Overdue for bone density scan.  Will reorder, # provided to call and schedule.  Continue calcium and vitamin D and regular weight bearing exercise - walking 1+ mile daily.       Relevant Orders   DG Bone Density   CKD (chronic kidney disease) stage 3, GFR 30-59 ml/min (HCC)    Chronic, stable period. Latest GFR 47. Continue good hydration status.       HLD (hyperlipidemia)    Chronic, not on statin. Reviewed ASCVD risk with patient - she declines and will rather focus on healthy diet choices.  The 10-year ASCVD risk score (Arnett DK, et al., 2019) is: 22.7%   Values used to calculate the score:     Age: 2 years     Sex: Female     Is Non-Hispanic African American: No     Diabetic: No     Tobacco smoker: Yes     Systolic Blood Pressure: 924 mmHg     Is BP treated: No     HDL Cholesterol: 56.6 mg/dL     Total Cholesterol: 209 mg/dL       Hemorrhoid    Appreciate GI care. Advised return to Dr Candis Schatz if ongoing trouble with internal hemorrhoids. She agrees.       Hypothyroidism (acquired)    Levels remain borderline low on lowest levothyroxine dose - she feels well at this dose.       Chronic constipation    Notes benefit with levothyroxine use.  Continue metamucil use.       Vitamin D deficiency    Encouraged compliance with vit D 2000 IU daily - she will check at home.         No orders of the defined types were placed in this encounter.  Orders Placed This Encounter  Procedures   DG Bone Density    Standing Status:   Future     Standing Expiration Date:   04/24/2022    Order Specific Question:   Reason for Exam (SYMPTOM  OR DIAGNOSIS REQUIRED)    Answer:   f/u osteopenia    Order Specific Question:   Preferred imaging location?    Answer:   [facility]     Patient instructions: Return to see Dr Candis Schatz if ongoing hemorrhoid trouble.  Call to schedule mammogram and bone density scan at your convenience: [place] [phone] If interested, check with pharmacy  about new 2 shot shingles series (shingrix).  Return as needed or in 4-6 months for follow up visit.   Follow up plan: Return in about 4 months (around 08/22/2021) for follow up visit.  Ria Bush, MD

## 2021-04-24 NOTE — Assessment & Plan Note (Addendum)
Preventative protocols reviewed and updated unless pt declined. Discussed healthy diet and lifestyle.  # provided to schedule mammogram and dexa as overdue for both

## 2021-04-24 NOTE — Assessment & Plan Note (Addendum)
Chronic, stable period. Latest GFR 47. Continue good hydration status.

## 2021-04-24 NOTE — Assessment & Plan Note (Addendum)
Notes benefit with levothyroxine use.  Continue metamucil use.

## 2021-04-24 NOTE — Assessment & Plan Note (Addendum)
Encouraged compliance with vit D 2000 IU daily - she will check at home.

## 2021-04-24 NOTE — Assessment & Plan Note (Signed)
Advanced directives: thinks she has one at home. Asked to bring me copy. Doesn't want prolonged life support if terminal condition "don't want to be kept alive", ok for reversible condition. HCPOA is Sherren Mocha son then Butch Penny daughter but this has not been updated.

## 2021-04-24 NOTE — Assessment & Plan Note (Signed)
Chronic, not on statin. Reviewed ASCVD risk with patient - she declines and will rather focus on healthy diet choices.  The 10-year ASCVD risk score (Arnett DK, et al., 2019) is: 22.7%   Values used to calculate the score:     Age: 76 years     Sex: Female     Is Non-Hispanic African American: No     Diabetic: No     Tobacco smoker: Yes     Systolic Blood Pressure: 802 mmHg     Is BP treated: No     HDL Cholesterol: 56.6 mg/dL     Total Cholesterol: 209 mg/dL

## 2021-04-24 NOTE — Assessment & Plan Note (Signed)
Appreciate GI care. Advised return to Dr Candis Schatz if ongoing trouble with internal hemorrhoids. She agrees.

## 2021-04-26 ENCOUNTER — Telehealth: Payer: Self-pay | Admitting: Gastroenterology

## 2021-04-26 NOTE — Telephone Encounter (Signed)
Please advise 

## 2021-04-26 NOTE — Telephone Encounter (Signed)
Patient called seeking to do another banding procedure not sure if she can since she took out the last one he did.

## 2021-04-30 ENCOUNTER — Telehealth: Payer: Self-pay | Admitting: Family Medicine

## 2021-04-30 ENCOUNTER — Telehealth: Payer: Self-pay | Admitting: Gastroenterology

## 2021-04-30 NOTE — Telephone Encounter (Signed)
Called and spoke to pt. Scheduled pt for hemorrhoid banding on 05/21/21 at 3:40 pm.

## 2021-04-30 NOTE — Telephone Encounter (Signed)
Pt reports that thyroid medication has caused her to be incontinent of stool 2x this month and can no longer take levothyroxine. Advised pt to contact her PCP office to let them know she has stopped medication. Pt apologized and stated that she thought she had called Dr. Bosie Clos office. Pt stated she would contact PCP.

## 2021-04-30 NOTE — Telephone Encounter (Signed)
Pt called stating that she can control her bowel movement. Pt seems to think its from medication levothyroxine (SYNTHROID) 25 MCG tablet. Please advise.

## 2021-04-30 NOTE — Telephone Encounter (Signed)
Patient called and stated that she is going to stop taking the medication that Dr. Candis Schatz give her because it is causing her issues. Seeking advice, please advise.

## 2021-05-01 NOTE — Telephone Encounter (Signed)
Lvm asking pt to call back.  Need to get more details of sxs to and offer schedule OV if pt agrees.

## 2021-05-02 NOTE — Telephone Encounter (Addendum)
Lvm asking pt to call back.  Need to get more details of sxs to and offer schedule OV, if pt agrees.

## 2021-05-03 NOTE — Telephone Encounter (Signed)
Lvm asking pt to call back.  Need to get more details of sxs to and offer schedule OV, if pt agrees.

## 2021-05-15 ENCOUNTER — Other Ambulatory Visit: Payer: Self-pay | Admitting: Family Medicine

## 2021-05-21 ENCOUNTER — Encounter: Payer: Self-pay | Admitting: Gastroenterology

## 2021-05-21 ENCOUNTER — Ambulatory Visit (INDEPENDENT_AMBULATORY_CARE_PROVIDER_SITE_OTHER): Payer: Medicare PPO | Admitting: Gastroenterology

## 2021-05-21 DIAGNOSIS — K642 Third degree hemorrhoids: Secondary | ICD-10-CM

## 2021-05-21 NOTE — Patient Instructions (Addendum)
If you are age 77 or older, your body mass index should be between 23-30. Your Body mass index is 28.35 kg/m. If this is out of the aforementioned range listed, please consider follow up with your Primary Care Provider.  If you are age 68 or younger, your body mass index should be between 19-25. Your Body mass index is 28.35 kg/m. If this is out of the aformentioned range listed, please consider follow up with your Primary Care Provider.   HEMORRHOID BANDING PROCEDURE    FOLLOW-UP CARE   The procedure you have had should have been relatively painless since the banding of the area involved does not have nerve endings and there is no pain sensation.  The rubber band cuts off the blood supply to the hemorrhoid and the band may fall off as soon as 48 hours after the banding (the band may occasionally be seen in the toilet bowl following a bowel movement). You may notice a temporary feeling of fullness in the rectum which should respond adequately to plain Tylenol or Motrin.  Following the banding, avoid strenuous exercise that evening and resume full activity the next day.  A sitz bath (soaking in a warm tub) or bidet is soothing, and can be useful for cleansing the area after bowel movements.     To avoid constipation, take two tablespoons of natural wheat bran, natural oat bran, flax, Benefiber or any over the counter fiber supplement and increase your water intake to 7-8 glasses daily.    Unless you have been prescribed anorectal medication, do not put anything inside your rectum for two weeks: No suppositories, enemas, fingers, etc.  Occasionally, you may have more bleeding than usual after the banding procedure.  This is often from the untreated hemorrhoids rather than the treated one.  Dont be concerned if there is a tablespoon or so of blood.  If there is more blood than this, lie flat with your bottom higher than your head and apply an ice pack to the area. If the bleeding does not stop  within a half an hour or if you feel faint, call our office at [phone] or go to the emergency room.  Problems are not common; however, if there is a substantial amount of bleeding, severe pain, chills, fever or difficulty passing urine (very rare) or other problems, you should call us at [phone] or report to the nearest emergency room.  Do not stay seated continuously for more than 2-3 hours for a day or two after the procedure.  Tighten your buttock muscles 10-15 times every two hours and take 10-15 deep breaths every 1-2 hours.  Do not spend more than a few minutes on the toilet if you cannot empty your bowel; instead re-visit the toilet at a later time.    The [place] [facility] providers would like to encourage you to use [facility] to communicate with providers for non-urgent requests or questions.  Due to long hold times on the telephone, sending your provider a message by [facility] may be a faster and more efficient way to get a response.  Please allow 48 business hours for a response.  Please remember that this is for non-urgent requests.   It was a pleasure to see you today!  Thank you for trusting me with your gastrointestinal care!    Scott E.Candis Schatz, MD

## 2021-05-21 NOTE — Progress Notes (Signed)
PROCEDURE NOTE: The patient presents with symptomatic grade 3  hemorrhoids, requesting rubber band ligation of his/her hemorrhoidal disease.  All risks, benefits and alternative forms of therapy were described and informed consent was obtained.   The anorectum was pre-medicated with topical lidocaine (5%) and nitroglycerin (0.125%) The decision was made to band the Right anterior internal hemorrhoid, and the [place] was used to perform band ligation without complication.  Digital anorectal examination was then performed to assure proper positioning of the band, and to adjust the banded tissue as required.  The amount of tissue banded was suboptimal and a repeat attempt was recommended, but declined by the patient.  The patient was discharged home without pain or other issues.  Dietary and behavioral recommendations were given and along with follow-up instructions.     The following adjunctive treatments were recommended:  Fiber supplementation Adequate water intake Avoidance of straining and hard stools  The patient will return in 2-4 weeks for  follow-up and possible additional banding as required. No complications were encountered and the patient tolerated the procedure well.

## 2021-05-23 ENCOUNTER — Other Ambulatory Visit: Payer: Self-pay | Admitting: Family Medicine

## 2021-05-23 NOTE — Telephone Encounter (Signed)
See 08/07/20, OV note.

## 2021-06-20 ENCOUNTER — Ambulatory Visit: Payer: Medicare PPO | Admitting: Gastroenterology

## 2021-06-20 ENCOUNTER — Encounter: Payer: Self-pay | Admitting: Gastroenterology

## 2021-06-20 VITALS — BP 128/80 | HR 62 | Ht 63.5 in | Wt 153.0 lb

## 2021-06-20 DIAGNOSIS — K642 Third degree hemorrhoids: Secondary | ICD-10-CM

## 2021-06-20 NOTE — Progress Notes (Signed)
PROCEDURE NOTE: ?The patient presents with symptomatic grade III hemorrhoids, requesting rubber band ligation of his/her hemorrhoidal disease.  All risks, benefits and alternative forms of therapy were described and informed consent was obtained. ? ?This is the patient's fifth banding session.  On the patient's third banding sessation, the right anterior column was banded but the patient did not tolerate it and the band was removed digitally.  At her last session, the right anterior column was banded, but the amount of banded hemorrhoid tissue was felt to be suboptimal.  Repeat banding was offered but declined by the patient at that time. ? ?The anorectum was pre-medicated with topical lidocaine (5%) and nitroglycerin (0.125%) ?The decision was made to band the right anterior internal hemorrhoid, and the [place] [employer] System was used to perform band ligation without complication.  ?Digital anorectal examination was then performed to assure proper positioning of the band, and to adjust the banded tissue as required. ? The patient was discharged home without pain or other issues.  Dietary and behavioral recommendations were given and along with follow-up instructions.   ?  ?The following adjunctive treatments were recommended: ? ?Fiber supplementation ?Adequate water intake ?Avoidance of straining and hard stools ? ?The patient will return in 2-4 weeks for  follow-up and possible additional banding as required. ?No complications were encountered and the patient tolerated the procedure well. ? ?

## 2021-06-20 NOTE — Patient Instructions (Addendum)
If you are age 77 or older, your body mass index should be between 23-30. Your Body mass index is 26.68 kg/m?Marland Kitchen If this is out of the aforementioned range listed, please consider follow up with your Primary Care Provider. ? ?If you are age 43 or younger, your body mass index should be between 19-25. Your Body mass index is 26.68 kg/m?Marland Kitchen If this is out of the aformentioned range listed, please consider follow up with your Primary Care Provider.  ? ?Follow up as needed. ? ?HEMORRHOID BANDING PROCEDURE  ? ? FOLLOW-UP CARE ? ? ?The procedure you have had should have been relatively painless since the banding of the area involved does not have nerve endings and there is no pain sensation.  The rubber band cuts off the blood supply to the hemorrhoid and the band may fall off as soon as 48 hours after the banding (the band may occasionally be seen in the toilet bowl following a bowel movement). You may notice a temporary feeling of fullness in the rectum which should respond adequately to plain Tylenol? or Motrin?. ? ?Following the banding, avoid strenuous exercise that evening and resume full activity the next day.  A sitz bath (soaking in a warm tub) or bidet is soothing, and can be useful for cleansing the area after bowel movements.   ? ? ?To avoid constipation, take two tablespoons of natural wheat bran, natural oat bran, flax, Benefiber? or any over the counter fiber supplement and increase your water intake to 7-8 glasses daily.   ? ?Unless you have been prescribed anorectal medication, do not put anything inside your rectum for two weeks: No suppositories, enemas, fingers, etc. ? ?Occasionally, you may have more bleeding than usual after the banding procedure.  This is often from the untreated hemorrhoids rather than the treated one.  Don?t be concerned if there is a tablespoon or so of blood.  If there is more blood than this, lie flat with your bottom higher than your head and apply an ice pack to the area. If the  bleeding does not stop within a half an hour or if you feel faint, call our office at [phone] or go to the emergency room. ? ?Problems are not common; however, if there is a substantial amount of bleeding, severe pain, chills, fever or difficulty passing urine (very rare) or other problems, you should call us at [phone] or report to the nearest emergency room. ? ?Do not stay seated continuously for more than 2-3 hours for a day or two after the procedure.  Tighten your buttock muscles 10-15 times every two hours and take 10-15 deep breaths every 1-2 hours.  Do not spend more than a few minutes on the toilet if you cannot empty your bowel; instead re-visit the toilet at a later time. ? ?  ? ?The [place] [facility] providers would like to encourage you to use [facility] to communicate with providers for non-urgent requests or questions.  Due to long hold times on the telephone, sending your provider a message by [facility] may be a faster and more efficient way to get a response.  Please allow 48 business hours for a response.  Please remember that this is for non-urgent requests.  ? ?It was a pleasure to see you today! ? ?Thank you for trusting me with your gastrointestinal care!   ? ?Scott E.Candis Schatz, MD  ?

## 2021-07-02 ENCOUNTER — Other Ambulatory Visit: Payer: Self-pay | Admitting: Family Medicine

## 2021-07-09 DIAGNOSIS — B079 Viral wart, unspecified: Secondary | ICD-10-CM | POA: Diagnosis not present

## 2021-07-09 DIAGNOSIS — D485 Neoplasm of uncertain behavior of skin: Secondary | ICD-10-CM | POA: Diagnosis not present

## 2021-08-23 ENCOUNTER — Ambulatory Visit: Payer: Medicare PPO | Admitting: Family Medicine

## 2021-08-23 ENCOUNTER — Encounter: Payer: Self-pay | Admitting: Family Medicine

## 2021-08-23 VITALS — BP 140/80 | HR 66 | Temp 97.8°F | Ht 63.5 in | Wt 152.5 lb

## 2021-08-23 DIAGNOSIS — K641 Second degree hemorrhoids: Secondary | ICD-10-CM | POA: Diagnosis not present

## 2021-08-23 DIAGNOSIS — E039 Hypothyroidism, unspecified: Secondary | ICD-10-CM | POA: Diagnosis not present

## 2021-08-23 DIAGNOSIS — E559 Vitamin D deficiency, unspecified: Secondary | ICD-10-CM

## 2021-08-23 DIAGNOSIS — M8580 Other specified disorders of bone density and structure, unspecified site: Secondary | ICD-10-CM

## 2021-08-23 DIAGNOSIS — F4323 Adjustment disorder with mixed anxiety and depressed mood: Secondary | ICD-10-CM | POA: Diagnosis not present

## 2021-08-23 DIAGNOSIS — N1831 Chronic kidney disease, stage 3a: Secondary | ICD-10-CM | POA: Diagnosis not present

## 2021-08-23 LAB — RENAL FUNCTION PANEL
Albumin: 4.1 g/dL (ref 3.5–5.2)
BUN: 15 mg/dL (ref 6–23)
CO2: 26 mEq/L (ref 19–32)
Calcium: 8.8 mg/dL (ref 8.4–10.5)
Chloride: 108 mEq/L (ref 96–112)
Creatinine, Ser: 1.19 mg/dL (ref 0.40–1.20)
GFR: 44.43 mL/min — ABNORMAL LOW (ref >60.00)
Glucose, Bld: 91 mg/dL (ref 70–99)
Phosphorus: 3.5 mg/dL (ref 2.3–4.6)
Potassium: 3.8 mEq/L (ref 3.5–5.1)
Sodium: 141 mEq/L (ref 135–145)

## 2021-08-23 LAB — VITAMIN D 25 HYDROXY (VIT D DEFICIENCY, FRACTURES): VITD: 22.53 ng/mL — ABNORMAL LOW (ref 30.00–100.00)

## 2021-08-23 NOTE — Assessment & Plan Note (Signed)
Appreciate Dr. Dayle Points care. She notes ongoing internal hemorrhoid issues without bleeding.  Number provided to call and follow-up with GI.

## 2021-08-23 NOTE — Assessment & Plan Note (Signed)
Borderline levels off of thyroid replacement. She will remain fully off levothyroxine at this time.

## 2021-08-23 NOTE — Patient Instructions (Addendum)
Blood pressure checks today.  Call to schedule mammogram and bone density scan at your convenience: [place] [phone] Call [facility] to schedule an appointment at [phone] for ongoing hemorrhoid trouble.  Good to see you today. Return as needed or in 4 months for follow up visit.

## 2021-08-23 NOTE — Progress Notes (Signed)
Patient ID: Karen [name], female    DOB: July 06, 1945, 76 y.o.   MRN: [id number]  This visit was conducted in person.  BP 140/80   Pulse 66   Temp 97.8 F (36.6 C) (Temporal)   Ht 5' 3.5" (1.613 m)   Wt 152 lb 8 oz (69.2 kg)   LMP  (LMP Unknown)   SpO2 96%   BMI 26.59 kg/m    CC: 4 mo f/u visit  Subjective:   HPI: Karen [name] is a 76 y.o. female presenting on 08/23/2021 for Follow-up (Here for 4 mo f/u.)   Off and on on paroxetine, hydroxyzine, levothyroxine. Currently stopped levothyroxine and paroxetine, will continue hydroxyzine PRN sleep.  Has not yet completed mammo or DEXA scan, # again provided.   Notes ongoing difficulty with grade III int hemorrhoids s/p multiple banding procedures - advised call and f/u with GI.      Relevant past medical, surgical, family and social history reviewed and updated as indicated. Interim medical history since our last visit reviewed. Allergies and medications reviewed and updated. Outpatient Medications Prior to Visit  Medication Sig Dispense Refill   acetaminophen (TYLENOL) 500 MG tablet Take 1 tablet (500 mg total) by mouth every 6 (six) hours as needed.     calcium-vitamin D (OSCAL WITH D) 500-200 MG-UNIT tablet Take 1 tablet by mouth 3 (three) times daily. 90 tablet 6   Cholecalciferol (VITAMIN D3) 50 MCG (2000 UT) TABS Take 2,000 Units by mouth daily.     hydrOXYzine (ATARAX) 10 MG tablet TAKE 1 TO 2 TABLETS BY MOUTH TWICE A DAY AS NEEDED FOR ANXIETY 30 tablet 5   Multiple Vitamin (MULTIVITAMIN WITH MINERALS) TABS tablet Take 1 tablet by mouth daily. Vita-[facility] Women Multivitamn     psyllium (METAMUCIL) 58.6 % packet Take 1 packet by mouth daily as needed.     vitamin E 180 MG (400 UNITS) capsule Take 400 Units by mouth daily.     levothyroxine (SYNTHROID) 25 MCG tablet Take 1 tablet (25 mcg total) by mouth daily. 90 tablet 0   No facility-administered medications prior to visit.     Per HPI unless specifically indicated in ROS  section below Review of Systems  Objective:  BP 140/80   Pulse 66   Temp 97.8 F (36.6 C) (Temporal)   Ht 5' 3.5" (1.613 m)   Wt 152 lb 8 oz (69.2 kg)   LMP  (LMP Unknown)   SpO2 96%   BMI 26.59 kg/m   Wt Readings from Last 3 Encounters:  08/23/21 152 lb 8 oz (69.2 kg)  06/20/21 153 lb (69.4 kg)  05/21/21 155 lb (70.3 kg)      Physical Exam Vitals and nursing note reviewed.  Constitutional:      Appearance: Normal appearance. She is not ill-appearing.  Cardiovascular:     Rate and Rhythm: Normal rate and regular rhythm.     Pulses: Normal pulses.     Heart sounds: Normal heart sounds. No murmur heard. Pulmonary:     Effort: Pulmonary effort is normal. No respiratory distress.     Breath sounds: Normal breath sounds. No wheezing, rhonchi or rales.  Neurological:     Mental Status: She is alert.      Lab Results  Component Value Date   CREATININE 1.14 04/17/2021   BUN 13 04/17/2021   NA 141 04/17/2021   K 3.8 04/17/2021   CL 109 04/17/2021   CO2 25 04/17/2021  GFR 46  Lab Results  Component Value Date   VD25OH 23.14 (L) 04/17/2021   Assessment & Plan:   Problem List Items Addressed This Visit     Adjustment disorder with mixed anxiety and depressed mood    Overall stable period OFF paxil (paroxetine). Will remain off at this time.  Discussed okay to continue hydroxyzine as needed sleep, high anxiety.       Osteopenia    Number again provided to call and schedule bone density scan. Recommend daily vitamin D 2000 units.  She has not been regularly taking.       CKD (chronic kidney disease) stage 3, GFR 30-59 ml/min (HCC) - Primary    Chronic, stable.  Update renal panel. Discussed importance of daily vitamin D3 2000 unit intake as her levels were low.       Relevant Orders   Renal function panel   VITAMIN D 25 Hydroxy (Vit-D Deficiency, Fractures)   Hemorrhoid    Appreciate Dr. Dayle Points care. She notes ongoing internal hemorrhoid issues  without bleeding.  Number provided to call and follow-up with GI.       Hypothyroidism (acquired)    Borderline levels off of thyroid replacement. She will remain fully off levothyroxine at this time.       Vitamin D deficiency    Update levels today. She has not been taking vitamin D regularly-recommend start 2000 units every day.         No orders of the defined types were placed in this encounter.  Orders Placed This Encounter  Procedures   Renal function panel   VITAMIN D 25 Hydroxy (Vit-D Deficiency, Fractures)     Patient Instructions  Blood pressure checks today.  Call to schedule mammogram and bone density scan at your convenience: [place] [phone] Call [facility] to schedule an appointment at [phone] for ongoing hemorrhoid trouble.  Good to see you today. Return as needed or in 4 months for follow up visit.   Follow up plan: Return in about 4 months (around 12/24/2021), or if symptoms worsen or fail to improve, for follow up visit.  Ria Bush, MD

## 2021-08-23 NOTE — Assessment & Plan Note (Signed)
Overall stable period OFF paxil (paroxetine). Will remain off at this time.  Discussed okay to continue hydroxyzine as needed sleep, high anxiety.

## 2021-08-23 NOTE — Assessment & Plan Note (Addendum)
Number again provided to call and schedule bone density scan. Recommend daily vitamin D 2000 units.  She has not been regularly taking.

## 2021-08-23 NOTE — Assessment & Plan Note (Signed)
Update levels today. She has not been taking vitamin D regularly-recommend start 2000 units every day.

## 2021-08-23 NOTE — Assessment & Plan Note (Signed)
Chronic, stable.  Update renal panel. Discussed importance of daily vitamin D3 2000 unit intake as her levels were low.

## 2021-08-24 ENCOUNTER — Other Ambulatory Visit: Payer: Self-pay | Admitting: Family Medicine

## 2021-08-28 ENCOUNTER — Other Ambulatory Visit: Payer: Self-pay | Admitting: Family Medicine

## 2021-08-29 ENCOUNTER — Other Ambulatory Visit: Payer: Self-pay | Admitting: Family Medicine

## 2021-08-30 NOTE — Telephone Encounter (Signed)
Med d/c.  See 08/07/20 OV notes.

## 2021-09-18 ENCOUNTER — Telehealth: Payer: Self-pay

## 2021-09-18 NOTE — Telephone Encounter (Signed)
Patient called wanted to let you know that she was seen by GI and had surgery. She is confused why they didn't get rid of all of them. She states that she has one that is internal that is starting to bother her now. I have given number to patient GI for patient to call for follow up with them. She wanted to know what you can do to get her seen by them. Advised that you are out of the office this week and will review once you are back.

## 2021-09-20 NOTE — Telephone Encounter (Signed)
Lvm asking pt to call back.  Need to relay Dr. G's message.  

## 2021-09-20 NOTE — Telephone Encounter (Signed)
I do agree she call [facility] at [phone] to schedule an appointment for re evaluation and possible treatment.  It is not abnormal for hemorrhoids to need multiple treatments as they can recur/persist.

## 2021-09-21 ENCOUNTER — Other Ambulatory Visit: Payer: Self-pay | Admitting: Family Medicine

## 2021-09-30 ENCOUNTER — Telehealth: Payer: Self-pay | Admitting: Family Medicine

## 2021-09-30 NOTE — Telephone Encounter (Signed)
Patient called and just wanted to know that she called the hemroid doctor and couldn't get in touch with them today. She said shes got some of them taken care of but would like to have the rest of them taken care of as well. I did let her know that because tomorrow is a holiday there office hours could be different for today and she said she will just wait for them to call her back but she just want to make Dr G aware of it

## 2021-09-30 NOTE — Telephone Encounter (Signed)
Dr Candis Schatz, Thanks for seeing this patient. I believe she'd like to schedule an appointment for further treatment of her hemorrhoids. Thank you!

## 2021-10-02 NOTE — Telephone Encounter (Signed)
It looks like [facility] (Dr. Candis Schatz) is trying to contact pt.  (See Dr. Synthia Innocent message to Dr. Candis Schatz below).

## 2021-10-02 NOTE — Telephone Encounter (Signed)
[place] [facility] - Client Nonclinical Telephone Record  [facility] Client [place] [facility] - Client Client Site [place] Provider Ria Bush - MD Contact Type Call Who Is Calling Patient / Member / Family / Caregiver Caller Name Shefali Ng Phone Number [phone] Patient Name Karen [name] Patient DOB 29-Aug-1945 Call Type Message Only Information Provided Reason for Call Request for General Office Information Initial Comment Caller states she received a message from her doctor. She wants to know what the message was. Additional Comment Provided office hours. Disp. Time Disposition Final User 10/01/2021 7:16:09 PM General Information Provided Yes [place], [place] Call Closed By: Shireen Quan Transaction Date/Time: 10/01/2021 7:12:49 PM (ET

## 2021-10-02 NOTE — Telephone Encounter (Signed)
Left message on machine to call back  

## 2021-10-03 NOTE — Telephone Encounter (Signed)
Lvm asking pt to call back.  Need to confirm she is scheduled with Dr. Candis Schatz on Mon, 7/10 at 1:30.

## 2021-10-03 NOTE — Telephone Encounter (Signed)
Thank you. Lattie Haw - can you try to reach patient to confirm with her upcoming appointment with [facility] Dr Candis Schatz 10/07/2021 at 1:30pm? Thanks.

## 2021-10-03 NOTE — Telephone Encounter (Signed)
Pt scheduled for hem banding with Dr. Loletha Grayer 10/07/21 at 1:30pm. Pt left detailed message regarding the appt on her personal voicemail.

## 2021-10-04 NOTE — Telephone Encounter (Addendum)
Spoke with pt asking about GI appt. Pt confirms appt on 10/07/21 and has it written on her calendar.

## 2021-10-07 ENCOUNTER — Encounter: Payer: Self-pay | Admitting: Gastroenterology

## 2021-10-07 ENCOUNTER — Ambulatory Visit: Payer: Medicare PPO | Admitting: Gastroenterology

## 2021-10-07 VITALS — BP 128/78 | HR 73 | Ht 63.5 in | Wt 151.0 lb

## 2021-10-07 DIAGNOSIS — K642 Third degree hemorrhoids: Secondary | ICD-10-CM

## 2021-10-07 NOTE — Patient Instructions (Signed)
If you are age 77 or older, your body mass index should be between 23-30. Your Body mass index is 26.33 kg/m. If this is out of the aforementioned range listed, please consider follow up with your Primary Care Provider.  If you are age 69 or younger, your body mass index should be between 19-25. Your Body mass index is 26.33 kg/m. If this is out of the aformentioned range listed, please consider follow up with your Primary Care Provider.   HEMORRHOID BANDING PROCEDURE    FOLLOW-UP CARE   The procedure you have had should have been relatively painless since the banding of the area involved does not have nerve endings and there is no pain sensation.  The rubber band cuts off the blood supply to the hemorrhoid and the band may fall off as soon as 48 hours after the banding (the band may occasionally be seen in the toilet bowl following a bowel movement). You may notice a temporary feeling of fullness in the rectum which should respond adequately to plain Tylenol or Motrin.  Following the banding, avoid strenuous exercise that evening and resume full activity the next day.  A sitz bath (soaking in a warm tub) or bidet is soothing, and can be useful for cleansing the area after bowel movements.     To avoid constipation, take two tablespoons of natural wheat bran, natural oat bran, flax, Benefiber or any over the counter fiber supplement and increase your water intake to 7-8 glasses daily.    Unless you have been prescribed anorectal medication, do not put anything inside your rectum for two weeks: No suppositories, enemas, fingers, etc.  Occasionally, you may have more bleeding than usual after the banding procedure.  This is often from the untreated hemorrhoids rather than the treated one.  Don't be concerned if there is a tablespoon or so of blood.  If there is more blood than this, lie flat with your bottom higher than your head and apply an ice pack to the area. If the bleeding does not stop  within a half an hour or if you feel faint, call our office at [phone] or go to the emergency room.  Problems are not common; however, if there is a substantial amount of bleeding, severe pain, chills, fever or difficulty passing urine (very rare) or other problems, you should call us at [phone] or report to the nearest emergency room.  Do not stay seated continuously for more than 2-3 hours for a day or two after the procedure.  Tighten your buttock muscles 10-15 times every two hours and take 10-15 deep breaths every 1-2 hours.  Do not spend more than a few minutes on the toilet if you cannot empty your bowel; instead re-visit the toilet at a later time.     The [place] [facility] providers would like to encourage you to use [facility] to communicate with providers for non-urgent requests or questions.  Due to long hold times on the telephone, sending your provider a message by [facility] may be a faster and more efficient way to get a response.  Please allow 48 business hours for a response.  Please remember that this is for non-urgent requests.   It was a pleasure to see you today!  Thank you for trusting me with your gastrointestinal care!

## 2021-10-07 NOTE — Progress Notes (Signed)
PROCEDURE NOTE: The patient presents with symptomatic grade 3  hemorrhoids, requesting rubber band ligation of his/her hemorrhoidal disease.  All risks, benefits and alternative forms of therapy were described and informed consent was obtained.  The patient reports ongoing symptoms of prolapsing hemorrhoids, overall improved with banding, but still persistent.  She tolerated the last banding session well.  All three hemorrhoid columns have been banded now (previously to manually remove a band on the RA column that was painful).   The anorectum was pre-medicated with topical lidocaine (5%) and nitroglycerin (0.125%) The decision was made to band the Left lateral  internal hemorrhoid, and the [place] was used to perform band ligation without complication.  Digital anorectal examination was then performed to assure proper positioning of the band, and to adjust the banded tissue as required.  The patient was discharged home without pain or other issues.  Dietary and behavioral recommendations were given and along with follow-up instructions.     The following adjunctive treatments were recommended:  Fiber supplementation Adequate water intake Avoidance of straining and hard stools  The patient will return in 2-4 weeks for  follow-up and possible additional banding as required. No complications were encountered and the patient tolerated the procedure well.

## 2021-10-31 ENCOUNTER — Telehealth: Payer: Self-pay | Admitting: Family Medicine

## 2021-10-31 NOTE — Telephone Encounter (Signed)
Patient called and said that the levothyroxine has been giving her bad diarrhea to the point where she cant even make it to the bathroom sometimes. She doesn't want to take the medication anymore. She wants to know what she should do. Call back is [phone]. She said its been going on a while.she said its hard to go anywhere because of it

## 2021-11-04 NOTE — Telephone Encounter (Signed)
Per our last discussion at [place] 07/2021, she should have fully stopped levothyroxine at that time. When did she restart it?  Agree with staying off.

## 2021-11-04 NOTE — Telephone Encounter (Signed)
She may try melatonin '5mg'$  at night for sleep

## 2021-11-04 NOTE — Telephone Encounter (Signed)
Patient stated that she was confused and thought that you told her to stop the Paroxetine Patient stated that she is still taking Levothyroxine. Patient stated that she asked some lady in the office if it was okay for her to occasionally take Paroxetine to help her sleep and was told yes. Patient stated that she can not remember who she asked. Patient was advised that both medications have been taken off of her list and she should not be taking either. Patient stated that she will stop taking both now. Patient stated sometimes at night she can not sleep. Patient wants to know what she can take to help her sleep. Pharmacy [facility]

## 2021-11-05 NOTE — Telephone Encounter (Signed)
Patient notified as instructed by telephone and verbalized understanding. 

## 2021-11-07 ENCOUNTER — Telehealth: Payer: Self-pay

## 2021-11-07 NOTE — Telephone Encounter (Signed)
Patient called in wanting to know if Dr.G wanted her to discontinue the Paroetine 20 MG. She said she has taken it for years and is not really wanting to stop taking it due to the side effects it will cause.

## 2021-11-07 NOTE — Telephone Encounter (Signed)
According to 04/24/21 OV notes, pt was to be off paroxetine (Paxil).  I don't see where Dr. Darnell Level told pt to resume med.   Lvm asking pt to call back.  Need to relay info above.

## 2021-11-11 ENCOUNTER — Ambulatory Visit: Payer: Medicare PPO | Admitting: Gastroenterology

## 2021-11-11 NOTE — Telephone Encounter (Signed)
According to 04/24/21 OV notes, pt was to be off paroxetine (Paxil).  I don't see where Dr. Darnell Level told pt to resume med.   Lvm asking pt to call back.  Need to relay info above.

## 2021-11-12 NOTE — Telephone Encounter (Signed)
According to 04/24/21 OV notes, pt was to be off paroxetine (Paxil).  I don't see where Dr. Darnell Level told pt to resume med. Mailing a letter.   Lvm asking pt to call back.  Need to relay info above.

## 2021-11-14 ENCOUNTER — Telehealth: Payer: Self-pay | Admitting: Family Medicine

## 2021-11-14 NOTE — Telephone Encounter (Signed)
Rtn pt's daughter's, Donnelle (on dpr) call.  States she is concerned about pt's memory and wants it addressed at pt's next OV on 12/25/21.

## 2021-11-14 NOTE — Telephone Encounter (Signed)
Patient daughter called and stated she wanted to speak with Dr.G CMA about her memory. Call back number [phone].

## 2021-11-18 NOTE — Telephone Encounter (Signed)
[place] Night - Client Nonclinical Telephone Record  [facility] Client [facility] Night - Client Client Site [place] Provider Ria Bush - MD Contact Type Call Who Is Calling Patient / Member / Family / Caregiver Caller Name Sophiagrace Benbrook Phone Number [phone] Patient Name Karen [name] Patient DOB 12-Sep-1945 Call Type Message Only Information Provided Reason for Call Request for General Office Information Initial Comment Caller states she would not being taking that medication anymore. Additional Comment Office hours provided. Caller stated she wanted to let Dr. Danise Mina know that she was completely off the medication. Disp. Time Disposition Final User 11/15/2021 8:46:17 PM General Information Provided Yes Hilario Quarry Call Closed By: Hilario Quarry Transaction Date/Time: 11/15/2021 8:36:02 PM (ET

## 2021-11-18 NOTE — Telephone Encounter (Signed)
Noted  

## 2021-11-20 ENCOUNTER — Ambulatory Visit: Payer: Medicare PPO | Admitting: Family Medicine

## 2021-11-26 ENCOUNTER — Ambulatory Visit: Payer: Medicare PPO | Admitting: Family Medicine

## 2021-11-28 ENCOUNTER — Ambulatory Visit: Payer: Medicare PPO | Admitting: Family

## 2021-11-28 ENCOUNTER — Encounter: Payer: Self-pay | Admitting: Family

## 2021-11-28 VITALS — BP 116/80 | HR 63 | Temp 98.6°F | Resp 16 | Ht 63.5 in | Wt 149.0 lb

## 2021-11-28 DIAGNOSIS — L989 Disorder of the skin and subcutaneous tissue, unspecified: Secondary | ICD-10-CM
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
## 2021-11-28 NOTE — Assessment & Plan Note (Signed)
With some black discoloration on the site, urgent referral to dermatology.

## 2021-11-28 NOTE — Patient Instructions (Signed)
A referral was placed today for dermatology  Please let us know if you have not heard back within 2 weeks about the referral.  Due to recent changes in healthcare laws, you may see results of your imaging and/or laboratory studies on MyChart before I have had a chance to review them.  I understand that in some cases there may be results that are confusing or concerning to you. Please understand that not all results are received at the same time and often I may need to interpret multiple results in order to provide you with the best plan of care or course of treatment. Therefore, I ask that you please give me 2 business days to thoroughly review all your results before contacting my office for clarification. Should we see a critical lab result, you will be contacted sooner.   It was a pleasure seeing you today! Please do not hesitate to reach out with any questions and or concerns.  Regards,   Celenia Hruska FNP-C  

## 2021-12-10 ENCOUNTER — Ambulatory Visit: Payer: Medicare PPO | Admitting: Dermatology

## 2021-12-10 DIAGNOSIS — L820 Inflamed seborrheic keratosis: Secondary | ICD-10-CM

## 2021-12-10 DIAGNOSIS — L309 Dermatitis, unspecified: Secondary | ICD-10-CM

## 2021-12-10 NOTE — Patient Instructions (Addendum)

## 2021-12-10 NOTE — Progress Notes (Signed)
   New Patient Visit  Subjective  Karen [name] is a 76 y.o. female who presents for the following: New Patient (Initial Visit).  Patient here for a growth on her mid back bra line present for several years, itchy at times. Catches on bra  Referral from Ria Bush, MD.  The following portions of the chart were reviewed this encounter and updated as appropriate:       Review of Systems:  No other skin or systemic complaints except as noted in HPI or Assessment and Plan.  Objective  Well appearing patient in no apparent distress; mood and affect are within normal limits.  A focused examination was performed including face, back. Relevant physical exam findings are noted in the Assessment and Plan.  Left Mid Back at Bra line Erythematous stuck-on, waxy papule  Right Mid Back Pink scaly patch    Assessment & Plan  Inflamed seborrheic keratosis Left Mid Back at Bra line  Symptomatic, irritating, patient would like treated. Irritated by bra.  Destruction of lesion - Left Mid Back at Bra line  Destruction method: cryotherapy   Informed consent: discussed and consent obtained   Lesion destroyed using liquid nitrogen: Yes   Region frozen until ice ball extended beyond lesion: Yes   Outcome: patient tolerated procedure well with no complications   Post-procedure details: wound care instructions given   Additional details:  Prior to procedure, discussed risks of blister formation, small wound, skin dyspigmentation, or rare scar following cryotherapy. Recommend Vaseline ointment to treated areas while healing.   Dermatitis Right Mid Back  Recommend mild soap and moisturizing cream 1-2 times daily.  Gentle skin care handout provided.   Discussed Rx topical cream, patient defers today, not bothersome.  May start 1% hydrocortisone cream prn itch.    Return if symptoms worsen or fail to improve.  IJamesetta Orleans, CMA, am acting as scribe for Brendolyn Patty, MD  .  Documentation: I have reviewed the above documentation for accuracy and completeness, and I agree with the above.  Brendolyn Patty MD

## 2021-12-13 ENCOUNTER — Ambulatory Visit: Payer: Medicare PPO | Admitting: Gastroenterology

## 2021-12-13 ENCOUNTER — Encounter: Payer: Self-pay | Admitting: Gastroenterology

## 2021-12-13 VITALS — BP 118/72 | HR 67 | Resp 95 | Ht 63.0 in | Wt 152.8 lb

## 2021-12-13 DIAGNOSIS — K642 Third degree hemorrhoids: Secondary | ICD-10-CM

## 2021-12-13 NOTE — Patient Instructions (Signed)
If you are age 77 or older, your body mass index should be between 23-30. Your Body mass index is 27.07 kg/m. If this is out of the aforementioned range listed, please consider follow up with your Primary Care Provider.  If you are age 61 or younger, your body mass index should be between 19-25. Your Body mass index is 27.07 kg/m. If this is out of the aformentioned range listed, please consider follow up with your Primary Care Provider.   HEMORRHOID BANDING PROCEDURE    FOLLOW-UP CARE   The procedure you have had should have been relatively painless since the banding of the area involved does not have nerve endings and there is no pain sensation.  The rubber band cuts off the blood supply to the hemorrhoid and the band may fall off as soon as 48 hours after the banding (the band may occasionally be seen in the toilet bowl following a bowel movement). You may notice a temporary feeling of fullness in the rectum which should respond adequately to plain Tylenol or Motrin.  Following the banding, avoid strenuous exercise that evening and resume full activity the next day.  A sitz bath (soaking in a warm tub) or bidet is soothing, and can be useful for cleansing the area after bowel movements.     To avoid constipation, take two tablespoons of natural wheat bran, natural oat bran, flax, Benefiber or any over the counter fiber supplement and increase your water intake to 7-8 glasses daily.    Unless you have been prescribed anorectal medication, do not put anything inside your rectum for two weeks: No suppositories, enemas, fingers, etc.  Occasionally, you may have more bleeding than usual after the banding procedure.  This is often from the untreated hemorrhoids rather than the treated one.  Don't be concerned if there is a tablespoon or so of blood.  If there is more blood than this, lie flat with your bottom higher than your head and apply an ice pack to the area. If the bleeding does not stop  within a half an hour or if you feel faint, call our office at [phone] or go to the emergency room.  Problems are not common; however, if there is a substantial amount of bleeding, severe pain, chills, fever or difficulty passing urine (very rare) or other problems, you should call us at [phone] or report to the nearest emergency room.  Do not stay seated continuously for more than 2-3 hours for a day or two after the procedure.  Tighten your buttock muscles 10-15 times every two hours and take 10-15 deep breaths every 1-2 hours.  Do not spend more than a few minutes on the toilet if you cannot empty your bowel; instead re-visit the toilet at a later time.    The [place] providers would like to encourage you to use [facility] to communicate with providers for non-urgent requests or questions.  Due to long hold times on the telephone, sending your provider a message by [facility] may be a faster and more efficient way to get a response.  Please allow 48 business hours for a response.  Please remember that this is for non-urgent requests.   It was a pleasure to see you today!  Thank you for trusting me with your gastrointestinal care!

## 2021-12-13 NOTE — Progress Notes (Unsigned)
PROCEDURE NOTE: The patient presents with symptomatic grade 3  hemorrhoids, requesting rubber band ligation of his/her hemorrhoidal disease.  All risks, benefits and alternative forms of therapy were described and informed consent was obtained.  Ms. Gaona reports continued prolapsing symptoms despite numerous bandings.  Anoscopy was performed today and showed prominent hemorrhoids, but no column seemed more prominent than the other.  The decision was made to re-band the right posterior column  The anorectum was pre-medicated with topical lidocaine (5%) and nitroglycerin (0.125%) The decision was made to band the Right posterior internal hemorrhoid, and the [place] was used to perform band ligation without complication.  Digital anorectal examination was then performed to assure proper positioning of the band, and to adjust the banded tissue as required.  The patient was discharged home without pain or other issues.  Dietary and behavioral recommendations were given and along with follow-up instructions.     The following adjunctive treatments were recommended:  Fiber supplementation Adequate water intake Avoidance of straining and hard stools  The patient will return in 2-4 weeks for  follow-up and possible additional banding as required. No complications were encountered and the patient tolerated the procedure well.

## 2021-12-20 ENCOUNTER — Telehealth: Payer: Self-pay | Admitting: Family Medicine

## 2021-12-20 ENCOUNTER — Encounter: Payer: Self-pay | Admitting: Family Medicine

## 2021-12-20 NOTE — Telephone Encounter (Signed)
She was off paroxetine back in 07/2021.  I don't think trouble sleeping would present so long after stopping so I don't think this is related. She may take hydroxyzine PRN sleep or anxiety - should still have this at home.

## 2021-12-20 NOTE — Telephone Encounter (Signed)
error 

## 2021-12-20 NOTE — Telephone Encounter (Signed)
Patient called in stating that Dr Darnell Level suggest she stop taking medication paroxetine, She said that she was up all night,and not able to sleep. She's wondering if this is one of the side effects of not taking this medication anymore?

## 2021-12-20 NOTE — Telephone Encounter (Signed)
Lvm asking pt to call back.  Need to relay Dr. G's message.  

## 2021-12-23 NOTE — Telephone Encounter (Signed)
Left another message on voicemail for patient to call the office back. 

## 2021-12-24 ENCOUNTER — Ambulatory Visit: Payer: Medicare PPO | Admitting: Family Medicine

## 2021-12-24 ENCOUNTER — Encounter: Payer: Self-pay | Admitting: Family Medicine

## 2021-12-24 VITALS — BP 118/76 | HR 62 | Temp 98.3°F | Ht 63.0 in | Wt 148.1 lb

## 2021-12-24 DIAGNOSIS — R413 Other amnesia: Secondary | ICD-10-CM | POA: Diagnosis not present

## 2021-12-24 DIAGNOSIS — F4323 Adjustment disorder with mixed anxiety and depressed mood: Secondary | ICD-10-CM

## 2021-12-24 DIAGNOSIS — G3184 Mild cognitive impairment, so stated: Secondary | ICD-10-CM | POA: Insufficient documentation

## 2021-12-24 DIAGNOSIS — M8580 Other specified disorders of bone density and structure, unspecified site: Secondary | ICD-10-CM

## 2021-12-24 NOTE — Patient Instructions (Addendum)
Call to schedule mammogram and bone density scan at your convenience: [place] [phone] May try melatonin '5mg'$  or hydroxyzine as needed for sleep.  Good to see you today.  Return in 3 months for physical and wellness visit.  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

## 2021-12-24 NOTE — Telephone Encounter (Signed)
Pt is seeing Dr. Darnell Level today at 11:30.

## 2021-12-24 NOTE — Assessment & Plan Note (Signed)
Denies significant trouble with this. States family members occasionally note she has trouble with short term memory.  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

## 2021-12-24 NOTE — Assessment & Plan Note (Signed)
#   provided to call and schedule DEXA, reordered today - she is overdue for this.

## 2021-12-24 NOTE — Progress Notes (Signed)
Patient ID: Karen [name], female    DOB: 1945-05-27, 76 y.o.   MRN: [id number]  This visit was conducted in person.  BP 118/76   Pulse 62   Temp 98.3 F (36.8 C) (Temporal)   Ht '5\' 3"'$  (1.6 m)   Wt 148 lb 2 oz (67.2 kg)   LMP  (LMP Unknown)   SpO2 99%   BMI 26.24 kg/m    CC: 4 month f/u visit  Subjective:   HPI: Karen [name] is a 76 y.o. female presenting on 12/24/2021 for Follow-up (Here for 4 mo f/u and memory chk. )   See prior note for details.  BP well controled today.  She had a fall several months ago - bruised upper arm and R side.   She is off paroxetine, she is off levothyroxine.   MMSE: 25/30 (misses 3 recall, 2 orientation) CDT: 4/4      Relevant past medical, surgical, family and social history reviewed and updated as indicated. Interim medical history since our last visit reviewed. Allergies and medications reviewed and updated. Outpatient Medications Prior to Visit  Medication Sig Dispense Refill   acetaminophen (TYLENOL) 500 MG tablet Take 1 tablet (500 mg total) by mouth every 6 (six) hours as needed.     calcium-vitamin D (OSCAL WITH D) 500-200 MG-UNIT tablet Take 1 tablet by mouth 3 (three) times daily. 90 tablet 6   Cholecalciferol (VITAMIN D3) 50 MCG (2000 UT) TABS Take 2,000 Units by mouth daily.     hydrOXYzine (ATARAX) 10 MG tablet TAKE 1 TO 2 TABLETS BY MOUTH TWICE A DAY AS NEEDED FOR ANXIETY 30 tablet 5   Multiple Vitamin (MULTIVITAMIN WITH MINERALS) TABS tablet Take 1 tablet by mouth daily. Vita-Lea Women Multivitamn     psyllium (METAMUCIL) 58.6 % packet Take 1 packet by mouth daily as needed.     vitamin E 180 MG (400 UNITS) capsule Take 400 Units by mouth daily.     No facility-administered medications prior to visit.     Per HPI unless specifically indicated in ROS section below Review of Systems  Objective:  BP 118/76   Pulse 62   Temp 98.3 F (36.8 C) (Temporal)   Ht '5\' 3"'$  (1.6 m)   Wt 148 lb 2 oz (67.2 kg)   LMP  (LMP  Unknown)   SpO2 99%   BMI 26.24 kg/m   Wt Readings from Last 3 Encounters:  12/24/21 148 lb 2 oz (67.2 kg)  12/13/21 152 lb 12.8 oz (69.3 kg)  11/28/21 149 lb (67.6 kg)      Physical Exam Vitals and nursing note reviewed.  Constitutional:      Appearance: Normal appearance. She is not ill-appearing.  Skin:    General: Skin is warm and dry.     Findings: No rash.  Neurological:     Mental Status: She is alert.  Psychiatric:        Mood and Affect: Mood normal.        Behavior: Behavior normal.     Comments: Tangential        Results for orders placed or performed in visit on 08/23/21  Renal function panel  Result Value Ref Range   Sodium 141 135 - 145 mEq/L   Potassium 3.8 3.5 - 5.1 mEq/L   Chloride 108 96 - 112 mEq/L   CO2 26 19 - 32 mEq/L   Albumin 4.1 3.5 - 5.2 g/dL   BUN 15 6 - 23 mg/dL  Creatinine, Ser 1.19 0.40 - 1.20 mg/dL   Glucose, Bld 91 70 - 99 mg/dL   Phosphorus 3.5 2.3 - 4.6 mg/dL   GFR 44.43 (L) >60.00 mL/min   Calcium 8.8 8.4 - 10.5 mg/dL  VITAMIN D 25 Hydroxy (Vit-D Deficiency, Fractures)  Result Value Ref Range   VITD 22.53 (L) 30.00 - 100.00 ng/mL    Assessment & Plan:   Problem List Items Addressed This Visit     Adjustment disorder with mixed anxiety and depressed mood - Primary    Overall stable period, remains off paxil (paroxetine). She states she occasionally takes a tablet PRN for sleep - discussed this will likely not help, rather to take hydroxyzine '10mg'$  or melatonin '5mg'$  for sleep.       Osteopenia    # provided to call and schedule DEXA, reordered today - she is overdue for this.       Relevant Orders   DG Bone Density   Memory difficulty    Denies significant trouble with this. States family members occasionally note she has trouble with short term memory.  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         No orders of the  defined types were placed in this encounter.  Orders Placed This Encounter  Procedures   DG Bone Density    Standing Status:   Future    Standing Expiration Date:   12/25/2022    Order Specific Question:   Reason for Exam (SYMPTOM  OR DIAGNOSIS REQUIRED)    Answer:   osteopenia    Order Specific Question:   Preferred imaging location?    Answer:   [facility]     Patient Instructions  Call to schedule mammogram and bone density scan at your convenience: [place] [phone] May try melatonin '5mg'$  or hydroxyzine as needed for sleep.  Good to see you today.  Return in 3 months for physical and wellness visit.  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   Follow up plan: Return in about 3 months (around 03/25/2022) for annual exam, prior fasting for blood work, medicare wellness visit.  Ria Bush, MD

## 2021-12-24 NOTE — Assessment & Plan Note (Signed)
Overall stable period, remains off paxil (paroxetine). She states she occasionally takes a tablet PRN for sleep - discussed this will likely not help, rather to take hydroxyzine '10mg'$  or melatonin '5mg'$  for sleep.

## 2021-12-25 ENCOUNTER — Ambulatory Visit: Payer: Medicare PPO | Admitting: Family Medicine

## 2021-12-30 ENCOUNTER — Telehealth: Payer: Self-pay | Admitting: Family Medicine

## 2021-12-30 NOTE — Telephone Encounter (Signed)
Spoke with pt reminding her she is to stay off paroxetine (Paxil) and to continue hydroxyzine (see 12/24/21 OV notes).  Pt verbalizes understanding and expresses her thanks for calling back.

## 2021-12-30 NOTE — Telephone Encounter (Signed)
Patient called back in and stated that the medication was Paroxetine.

## 2021-12-30 NOTE — Telephone Encounter (Signed)
Pt stated Dr. Darnell Level wanted pt to back off of the anxiety & depression pills. Pt stated she does not want to not be on pills anymore, she feels as if they're helping. Pt would like refills on meds? Call back # is [phone]

## 2021-12-31 ENCOUNTER — Other Ambulatory Visit: Payer: Self-pay | Admitting: *Deleted

## 2021-12-31 MED ORDER — HYDROXYZINE HCL 10 MG PO TABS: ORAL_TABLET | ORAL | 1 refills | Status: DC

## 2021-12-31 NOTE — Telephone Encounter (Signed)
Last refill 08/28/21 #30/5 Last office visit 12/24/21

## 2021-12-31 NOTE — Telephone Encounter (Signed)
PLEASE NOTE: All timestamps contained within this report are represented as [place] Standard Time. CONFIDENTIALTY NOTICE: This fax transmission is intended only for the addressee. It contains information that is legally privileged, confidential or otherwise protected from use or disclosure. If you are not the intended recipient, you are strictly prohibited from reviewing, disclosing, copying using or disseminating any of this information or taking any action in reliance on or regarding this information. If you have received this fax in error, please notify us immediately by telephone so that we can arrange for its return to [place]. Phone: [phone], Toll-Free: [phone], Fax: [phone] Page: 1 of 1 Call Id: [id number] [place] Night - Client TELEPHONE ADVICE RECORD [facility] Patient Name: [facility] [place] Gender: Female DOB: May 21, 1945 Age: 76 Y 9 M 22 D Return Phone Number: [phone] (Primary) Address: City/ State/ Zip: [place] [address] Client [place] Night - Client Client Site [place] Provider Ria Bush - MD Contact Type Call Who Is Calling Patient / Member / Family / Caregiver Call Type Triage / Clinical Relationship To Patient Self Return Phone Number [phone] (Primary) Chief Complaint Anxiety and Panic Attack Reason for Call Symptomatic / Request for Health Information Initial Comment Caller states needs prescription refill, is out of medication for anxiety. Translation No Disp. Time [name] Time) Disposition Final User 12/30/2021 6:09:55 PM Attempt made - no message left Ahorlu, RN, Shellia Cleverly 12/30/2021 6:25:07 PM FINAL ATTEMPT MADE - no message left Yes Ahorlu, RN, Shellia Cleverly Final Disposition 12/30/2021 6:25:07 PM FINAL ATTEMPT MADE - no message left Yes Ahorlu, RN, Shellia Cleverly

## 2021-12-31 NOTE — Telephone Encounter (Signed)
ERx 

## 2022-01-01 ENCOUNTER — Telehealth: Payer: Self-pay | Admitting: Family Medicine

## 2022-01-01 NOTE — Telephone Encounter (Signed)
Pt called stating she doesn't feel like all her Hemorrhoids were removed, she still feels like there's another one left. Pt stated she tried scheduling an appt with the dr that removed them for her & she wasn't allowed to. Pt wanted to know could you make her another appt? Call back # [phone]

## 2022-01-01 NOTE — Telephone Encounter (Signed)
Per last GI note: "The patient will return in 2-4 weeks for  follow-up and possible additional banding as required." I will forward to Dr Dayle Points RN. Please let me know if I need to place new referral. Thank you!

## 2022-01-01 NOTE — Telephone Encounter (Signed)
Spoke with pt to get clarification of message.  States GI told her she would have to go through Dr. Darnell Level to see them.

## 2022-01-02 NOTE — Telephone Encounter (Signed)
Pt scheduled for hem banding with Dr. Candis Schatz 01/06/22 at 10:10am. Left detailed message on pt identified voicemail and mychart message also sent to pt.

## 2022-01-06 ENCOUNTER — Encounter: Payer: Medicare PPO | Admitting: Gastroenterology

## 2022-01-08 ENCOUNTER — Other Ambulatory Visit: Payer: Self-pay | Admitting: Family Medicine

## 2022-01-09 NOTE — Telephone Encounter (Signed)
Pt is not taking this medication.  Currently taking hydroxyzine. (see 12/24/21 OV notes).

## 2022-01-13 ENCOUNTER — Telehealth: Payer: Self-pay

## 2022-01-13 NOTE — Telephone Encounter (Signed)
[place] Night - Client TELEPHONE ADVICE RECORD [facility] Patient Name: [facility] [place] Gender: Female DOB: [birth year] Age: 76 Y 10 M 5 D Return Phone Number: [phone] (Primary) Address: City/ State/ Zip: [place] [address] Client [place] Night - Client Client Site [place] Provider Ria Bush - MD Contact Type Call Who Is Calling Patient / Member / Family / Caregiver Call Type Triage / Clinical Relationship To Patient Self Return Phone Number [phone] (Primary) Chief Complaint Rash - Widespread Reason for Call Symptomatic / Request for Elizabeth has widespread rash breakout around waist and from her bottom down to back of thighs. She assumes is poison ivy and asks if she can take OTC that she has on hand. Translation No Nurse Assessment Nurse: Stephanie Coup, RN, Karlene Einstein Date/Time [name] Time): 01/12/2022 8:39:10 AM Confirm and document reason for call. If symptomatic, describe symptoms. ---Caller has widespread rash breakout around waist and from her bottom down to back of thighs. She assumes is poison ivy and asks if she can take OTC that she has on hand. Does the patient have any new or worsening symptoms? ---Yes Will a triage be completed? ---Yes Related visit to physician within the last 2 weeks? ---No Does the PT have any chronic conditions? (i.e. diabetes, asthma, this includes High risk factors for pregnancy, etc.) ---Yes List chronic conditions. ---anxiety Is this a behavioral health or substance abuse call? ---No Guidelines Guideline Title Affirmed Question Affirmed Notes Nurse Date/Time (Eastern Time) Poison Ivy - Oak - Sumac Mild rash from poison ivy, oak or sumac Stephanie Coup, RN, Karlene Einstein 01/12/2022 8:39:53 AM Disp. Time [name] Time) Disposition Final User 01/12/2022 8:45:39 AM Home Care Yes Stephanie Coup, RN, Karlene Einstein PLEASE NOTE: All  timestamps contained within this report are represented as [place] Standard Time. CONFIDENTIALTY NOTICE: This fax transmission is intended only for the addressee. It contains information that is legally privileged, confidential or otherwise protected from use or disclosure. If you are not the intended recipient, you are strictly prohibited from reviewing, disclosing, copying using or disseminating any of this information or taking any action in reliance on or regarding this information. If you have received this fax in error, please notify us immediately by telephone so that we can arrange for its return to [place]. Phone: [phone], Toll-Free: [phone], Fax: [phone] Page: 2 of 2 Call Id: [id number] Final Disposition 01/12/2022 8:45:39 AM Home Care Yes Stephanie Coup, RN, Evern Core Disagree/Comply Comply Caller Understands Yes PreDisposition Call Doctor Care Advice Given Per Guideline HOME CARE: * You should be able to treat this at home. LOCAL COLD FOR ITCHING: * Put a cold wet cloth on the itchy area for 20 minutes, or massage it with an ice cube. HYDROCORTISONE CREAM FOR ITCHING AND RASH: * Put 1% hydrocortisone cream on the itchy red area 3 times a day. Use it for one week. This will help decrease the itching and any redness. ANTIHISTAMINE MEDICINES FOR ITCHING: * You can take DIPHENHYDRAMINE (Benadryl) for itching that KEEPS YOU AWAKE at night. It can cause sleepiness and may help with the itching. CALL BACK IF: * Looks infected * Poison ivy rash lasts over 3 weeks * You become worse CARE ADVICE given per [place] (Adult) guideline. Comments User: Ilda Mori, RN Date/Time [name] Time): 01/12/2022 8:47:46 AM informed to call back for [facility] if not getting bette

## 2022-01-13 NOTE — Telephone Encounter (Signed)
Sending note to Dr Darnell Level and Lattie Haw CMA.

## 2022-01-13 NOTE — Telephone Encounter (Signed)
Spoke to patient by telephone and was advised that she used a cream that she had at the house. Patient stated that she is no longer itching and rash seems to be gone. Patient was advised to call the office back and schedule an appointment if symptoms return.

## 2022-02-12 ENCOUNTER — Telehealth: Payer: Self-pay

## 2022-02-12 ENCOUNTER — Encounter: Payer: Self-pay | Admitting: Family Medicine

## 2022-02-12 ENCOUNTER — Ambulatory Visit: Payer: Medicare PPO | Admitting: Family Medicine

## 2022-02-12 ENCOUNTER — Telehealth: Payer: Self-pay | Admitting: Family Medicine

## 2022-02-12 VITALS — BP 112/70 | HR 73 | Temp 98.1°F | Ht 63.0 in | Wt 149.0 lb

## 2022-02-12 DIAGNOSIS — L509 Urticaria, unspecified: Secondary | ICD-10-CM

## 2022-02-12 MED ORDER — PREDNISONE 20 MG PO TABS: ORAL_TABLET | ORAL | 0 refills | Status: DC

## 2022-02-12 NOTE — Telephone Encounter (Signed)
Patient called and stated she doesn't know why she was prescribed predniSONE (DELTASONE) 20 MG tablet . Call back number [phone].

## 2022-02-12 NOTE — Telephone Encounter (Signed)
[place] Night - Client Nonclinical Telephone Record  [facility] Client [facility] Night - Client Client Site [place] Provider Ria Bush - MD Contact Type Call Who Is Calling Patient / Member / Family / Caregiver Caller Name Marnie Fazzino Phone Number [phone] Patient Name Karen [name] Patient DOB [birth year] Call Type Message Only Information Provided Reason for Call Request to Schedule Office Appointment Initial Comment The caller states she would like to make an appt. Symptoms include pink welts over her body, and is itchy and is not sure what it is. Patient request to speak to RN No Additional Comment Office hours provided, declined nurse triage. Disp. Time Disposition Final User 02/12/2022 7:24:27 AM General Information Provided Yes Quitman Livings Call Closed By: Quitman Livings Transaction Date/Time: 02/12/2022 7:17:14 AM (ET

## 2022-02-12 NOTE — Telephone Encounter (Signed)
I spoke with pt and she said she has welts; no blisters at different spots all over body. Pt said just slight itching. Pt not sure of cause; has pets inside and pt stays outside in yard and woods behind her home.pt has no swelling in mouth, tongue,neck or throat and no difficulty breathing. Pt scheduled appt with Dr Lorelei Pont 02/12/22 at 9:40 with UC and ED precautions given and pt voiced understanding. Sending note to Dr Lorelei Pont and [place] pool.

## 2022-02-12 NOTE — Patient Instructions (Signed)
Get some zyrtec (certrizine) and take one of them once a day

## 2022-02-12 NOTE — Telephone Encounter (Signed)
Spoke with pt notifying her Dr. Lorelei Pont prescribed prednisone to help with the hives she saw him for today. Pt verbalizes understanding and expresses her thanks for the call.

## 2022-02-12 NOTE — Progress Notes (Signed)
Karen [name] T. Randy Castrejon, MD, Karen [name] Karen [name], [id number]  Phone: [phone]  FAX: [place] - 76 y.o. female  MRN [id number]  Date of Birth: 1945-04-14  Date: 02/12/2022  PCP: Ria Bush, MD  Referral: Ria Bush, MD  Chief Complaint  Patient presents with   Rash   Subjective:   Karen [name] is a 76 y.o. very pleasant female patient with Body mass index is 26.39 kg/m. who presents with the following:  Rash - urticaria. No new ingestions.    Very pleasant young lady presents with new onset of hives that began today.  She does not routinely have any kind of hives in she is not sure of any particular ingestion or exposure.  The only thing she could possibly think of was chocolate, she has had a systemic reaction to chocolate in the past.  Safranin she does not have any new foods, liquids, supplements, medications or anything else of the sort.  She is active and is outside a lot and is exposed to various plant matter.  Review of Systems is noted in the HPI, as appropriate  Objective:   BP 112/70   Pulse 73   Temp 98.1 F (36.7 C) (Oral)   Ht '5\' 3"'$  (1.6 m)   Wt 149 lb (67.6 kg)   LMP  (LMP Unknown)   SpO2 97%   BMI 26.39 kg/m   GEN: No acute distress; alert,appropriate. PULM: Breathing comfortably in no respiratory distress PSYCH: Normally interactive.  Scattered hives chest, abdomen, arms  Laboratory and Imaging Data:  Assessment and Plan:     ICD-10-CM   1. Urticaria  L50.9      Urticaria of unclear origin.  Patient Instructions  Get some zyrtec (certrizine) and take one of them once a day   Medication Management during today's office visit: Meds ordered this encounter  Medications   predniSONE (DELTASONE) 20 MG tablet    Sig: 2 tabs po for 4 days, then 1 tab po for 4 days    Dispense:  12 tablet    Refill:  0   There are no discontinued  medications.  Orders placed today for conditions managed today: No orders of the defined types were placed in this encounter.   Disposition: No follow-ups on file.  Dragon [employer] One speech-to-text software was used for transcription in this dictation.  Possible transcriptional errors can occur using Editor, commissioning.   Signed,  Maud Deed. Nelly Scriven, MD   Outpatient Encounter Medications as of 02/12/2022  Medication Sig   acetaminophen (TYLENOL) 500 MG tablet Take 1 tablet (500 mg total) by mouth every 6 (six) hours as needed.   calcium-vitamin D (OSCAL WITH D) 500-200 MG-UNIT tablet Take 1 tablet by mouth 3 (three) times daily.   Cholecalciferol (VITAMIN D3) 50 MCG (2000 UT) TABS Take 2,000 Units by mouth daily.   hydrOXYzine (ATARAX) 10 MG tablet TAKE 1 TO 2 TABLETS BY MOUTH TWICE A DAY AS NEEDED FOR ANXIETY   Multiple Vitamin (MULTIVITAMIN WITH MINERALS) TABS tablet Take 1 tablet by mouth daily. Vita-Lea Women Multivitamn   predniSONE (DELTASONE) 20 MG tablet 2 tabs po for 4 days, then 1 tab po for 4 days   psyllium (METAMUCIL) 58.6 % packet Take 1 packet by mouth daily as needed.   vitamin E 180 MG (400 UNITS) capsule Take 400 Units by mouth daily.   No facility-administered encounter medications on file as of  02/12/2022.    

## 2022-02-14 ENCOUNTER — Ambulatory Visit: Payer: Medicare PPO | Admitting: Gastroenterology

## 2022-02-16 ENCOUNTER — Other Ambulatory Visit: Payer: Self-pay | Admitting: Family Medicine

## 2022-02-17 ENCOUNTER — Other Ambulatory Visit: Payer: Self-pay | Admitting: Family Medicine

## 2022-02-17 NOTE — Telephone Encounter (Signed)
[place] Night - Client Nonclinical Telephone Record  [facility] Client [place] Night - Client Client Site [place] Provider Ria Bush - MD Contact Type Call Who Is Calling Patient / Member / Family / Caregiver Caller Name Estefanie Cornforth Phone Number [phone] Patient Name Karen [name] Patient DOB 07-19-1945 Call Type Message Only Information Provided Reason for Call Request for General Office Information Initial Comment caller states she needs a refill. Disp. Time Disposition Final User 02/16/2022 4:44:35 PM General Information Provided Yes Demaris Callander Call Closed By: Demaris Callander Transaction Date/Time: 02/16/2022 4:41:44 PM (ET

## 2022-02-17 NOTE — Telephone Encounter (Signed)
Refill request hydroxyzine Last refill 12/31/21 30/1 Last office visit 02/12/22 acute

## 2022-02-18 ENCOUNTER — Telehealth: Payer: Self-pay | Admitting: Family Medicine

## 2022-02-18 NOTE — Telephone Encounter (Signed)
Erx

## 2022-02-18 NOTE — Telephone Encounter (Signed)
This is a duplicate request. Received refill request from pharmacy yesterday.

## 2022-02-18 NOTE — Telephone Encounter (Signed)
See Rx refill request.

## 2022-02-18 NOTE — Telephone Encounter (Signed)
Caller Name: [place] Call back phone #: [phone]  MEDICATION(S):  hydrOXYzine (ATARAX) 10 MG tablet   Days of Med Remaining: 0  Has the patient contacted their pharmacy (YES/NO)? YES What did pharmacy advise?  Pharmacy stated it wasn't any refills remaining   Preferred Pharmacy:  [facility] [place]   ~~~Please advise patient/caregiver to allow 2-3 business days to process RX refills.    Pt stated she visited the pharmacy & they stated it wasn't anymore refills remaining. Pt is asking does Dr. Darnell Level not want her to remain on meds or does she just need a new prescription?

## 2022-04-07 ENCOUNTER — Other Ambulatory Visit: Payer: Self-pay | Admitting: Family Medicine

## 2022-04-07 ENCOUNTER — Telehealth: Payer: Self-pay | Admitting: Family Medicine

## 2022-04-07 DIAGNOSIS — F4323 Adjustment disorder with mixed anxiety and depressed mood: Secondary | ICD-10-CM

## 2022-04-07 NOTE — Telephone Encounter (Signed)
Prescription Request  04/07/2022  Is this a "Controlled Substance" medicine? No  LOV: 12/24/2021  What is the name of the medication or equipment? hydrOXYzine (ATARAX) 10 MG tablet   Have you contacted your pharmacy to request a refill? Yes   Pt stated she went to go pick meds up & was told the meds hadn't been approved by Dr. Darnell Level   Which pharmacy would you like this sent to?  [name] [address] [place] [address]Phone: [phone]: [phone]   Patient notified that their request is being sent to the clinical staff for review and that they should receive a response within 2 business days.   Please advise at Mobile [phone](mobile)

## 2022-04-07 NOTE — Telephone Encounter (Signed)
Pt called in to know status of refill Please advise [phone]

## 2022-04-07 NOTE — Telephone Encounter (Signed)
E-scribed refill earlier today.   Spoke with pt notifying her of info above. Pt expresses her thanks.

## 2022-04-07 NOTE — Telephone Encounter (Signed)
Pt called to check on the status of her med refill? Pt states she needs the meds & she hasn't had any for the day. Call back # [phone].

## 2022-04-19 ENCOUNTER — Other Ambulatory Visit: Payer: Self-pay | Admitting: Family Medicine

## 2022-04-19 DIAGNOSIS — E039 Hypothyroidism, unspecified: Secondary | ICD-10-CM

## 2022-04-19 DIAGNOSIS — E559 Vitamin D deficiency, unspecified: Secondary | ICD-10-CM

## 2022-04-19 DIAGNOSIS — N1831 Chronic kidney disease, stage 3a: Secondary | ICD-10-CM

## 2022-04-19 DIAGNOSIS — E7800 Pure hypercholesterolemia, unspecified: Secondary | ICD-10-CM

## 2022-04-21 ENCOUNTER — Other Ambulatory Visit: Payer: Self-pay | Admitting: Family Medicine

## 2022-04-21 DIAGNOSIS — F4323 Adjustment disorder with mixed anxiety and depressed mood: Secondary | ICD-10-CM

## 2022-04-21 NOTE — Telephone Encounter (Addendum)
Refill request Hydroxyzine Last office visit 02/12/22 acute Last refill 04/07/22 #30

## 2022-04-22 ENCOUNTER — Other Ambulatory Visit: Payer: Medicare PPO

## 2022-04-22 NOTE — Telephone Encounter (Signed)
[name] - Client Nonclinical Telephone Record  [facility] Client [place] Night - Client Client Site [name] Provider AA - PHYSICIAN, Verita Schneiders- MD Contact Type Call Who Is Calling Patient / Member / Family / Caregiver Caller Name Declined to provide Caller Phone Number Declined to provide Call Type Message Only Information Provided Reason for Call Request for General Office Information Initial Comment Caller states that she needs a refill of her hydroxyzine. It needs to be approved Karen [name]. She does not understand why it has not been approved. Disp. Time Disposition Final User 04/21/2022 5:50:43 PM General Information Provided Yes Karen [name] Call Closed By: Karen [name] Transaction Date/Time: 04/21/2022 5:46:17 PM (ET   Sending note to Dr Darnell Level and G pool.

## 2022-04-23 NOTE — Telephone Encounter (Signed)
ERx 

## 2022-04-24 ENCOUNTER — Other Ambulatory Visit: Payer: Medicare PPO

## 2022-04-29 ENCOUNTER — Ambulatory Visit (INDEPENDENT_AMBULATORY_CARE_PROVIDER_SITE_OTHER): Payer: Medicare PPO | Admitting: Family Medicine

## 2022-04-29 ENCOUNTER — Telehealth: Payer: Self-pay | Admitting: Family Medicine

## 2022-04-29 ENCOUNTER — Encounter: Payer: Self-pay | Admitting: Family Medicine

## 2022-04-29 VITALS — BP 134/78 | HR 68 | Temp 97.1°F | Ht 62.0 in | Wt 144.2 lb

## 2022-04-29 DIAGNOSIS — Z7189 Other specified counseling: Secondary | ICD-10-CM

## 2022-04-29 DIAGNOSIS — E039 Hypothyroidism, unspecified: Secondary | ICD-10-CM | POA: Diagnosis not present

## 2022-04-29 DIAGNOSIS — K641 Second degree hemorrhoids: Secondary | ICD-10-CM

## 2022-04-29 DIAGNOSIS — E7800 Pure hypercholesterolemia, unspecified: Secondary | ICD-10-CM | POA: Diagnosis not present

## 2022-04-29 DIAGNOSIS — F4323 Adjustment disorder with mixed anxiety and depressed mood: Secondary | ICD-10-CM

## 2022-04-29 DIAGNOSIS — N1831 Chronic kidney disease, stage 3a: Secondary | ICD-10-CM | POA: Diagnosis not present

## 2022-04-29 DIAGNOSIS — Z0000 Encounter for general adult medical examination without abnormal findings: Secondary | ICD-10-CM | POA: Diagnosis not present

## 2022-04-29 DIAGNOSIS — K5909 Other constipation: Secondary | ICD-10-CM

## 2022-04-29 DIAGNOSIS — E559 Vitamin D deficiency, unspecified: Secondary | ICD-10-CM

## 2022-04-29 DIAGNOSIS — M8588 Other specified disorders of bone density and structure, other site: Secondary | ICD-10-CM

## 2022-04-29 DIAGNOSIS — R413 Other amnesia: Secondary | ICD-10-CM

## 2022-04-29 NOTE — Telephone Encounter (Signed)
Patient was notified by telephone that she needs to come back for additional lab work. Patient stated that she will be back to the office shortly.

## 2022-04-29 NOTE — Progress Notes (Unsigned)
Patient ID: Karen [name], female    DOB: Dec 20, 1945, 77 y.o.   MRN: [id number]  This visit was conducted in person.  BP 134/78   Pulse 68   Temp (!) 97.1 F (36.2 C) (Temporal)   Ht '5\' 2"'$  (1.575 m)   Wt 144 lb 4 oz (65.4 kg)   LMP  (LMP Unknown)   SpO2 97%   BMI 26.38 kg/m    CC: AMW/CPE Subjective:   HPI: Karen [name] is a 77 y.o. female presenting on 04/29/2022 for Medicare Wellness   Did not see health advisor this year.  Hearing Screening   '500Hz'$  '1000Hz'$  '2000Hz'$  '4000Hz'$   Right ear 20 25 40 40  Left ear 20 40 20 40   Vision Screening   Right eye Left eye Both eyes  Without correction '20/25 20/20 20/25 '$  With correction       Flowsheet Row Office Visit from 04/29/2022 in [place]  PHQ-2 Total Score 0          04/29/2022    9:50 AM 04/16/2021    2:04 PM 03/28/2020    2:12 PM 03/16/2019   12:42 PM 03/12/2018    3:17 PM  Fall Risk   Falls in the past year? '1 1 1 1 '$ 0  Comment   fell off table    Number falls in past yr: 0 0 0 0   Injury with Fall? 0 '1 1 1   '$ Comment   broke her shoulder    Risk for fall due to :  Other (Comment) History of fall(s)    Risk for fall due to: Comment  fell off table     Follow up  Falls prevention discussed Falls evaluation completed;Falls prevention discussed     Hospitalized 11/2018 with urosepsis (largely asymptomatic) with SVT and elevated cardiac enzymes due to demand inschemia. Abnormal EKG showing LAD, poor R wave progression, LAFB. Echocardiogram overall reassuring (impaired LV relaxation).    Humeral fracture s/p ORIF 01/2020 (Dr Georga Kaufmann).    Saw GI Dr Candis Schatz for prolapsing hemorrhoids s/p serial hemorrhoid banding, rec daily metamucil. she asked for assistance in scheduling f/u GI appt for banding - I asked GI office to schedule this which was done for 10/9 and pt was notified however she no showed this appointment - states this is no longer a problem    She is off paroxetine and  levothyroxine. She continues hydroxyzine PRN.  Previous MMSE 25/30, CDT 4/4.  Fhmx CAD. Older sister with dementia.   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    Preventative: COLONOSCOPY 12/2014 - TA, diverticulosis, ext and int hemorrhoids, rpt 5 yrs ([name]) --> now established with Dr Amada [facility], he recommended rpt colonoscopy 7-10 yrs. Due for rpt, asked to check with Dr Candis Schatz.  Mammogram 05/2018 birads1 -due for repeat, # provided  Well woman - s/p hysterectomy for benign reasons. Ovaries removed as well.  DEXA Date: 11/2012 T score -2.4 AP spine. Due for rpt, # provided Lung cancer screening - not eligible  Flu shot yearly  Shoreline 05/2019, 05/2019, declines booster  Pneumovax 2014, [id number] 2015  Tdap 07/2016  Zostavax 2012  Shingrix - unsure if she's received - asked to check with pharmacy Advanced directives: thinks she has one at home. Asked to bring me copy. Doesn't want prolonged life support if terminal condition "don't want to be kept alive", ok for reversible condition. HCPOA is Karen [name] son then  Karen [name] daughter but this may not have been updated.  Seat belt use discussed.  Sunscreen use discussed. No changing moles on skin. 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 Interim medical history since our last visit reviewed. Allergies and medications reviewed and updated. Outpatient Medications Prior to Visit  Medication Sig Dispense Refill   acetaminophen (TYLENOL) 500 MG tablet  Take 1 tablet (500 mg total) by mouth every 6 (six) hours as needed.     calcium-vitamin D (OSCAL WITH D) 500-200 MG-UNIT tablet Take 1 tablet by mouth 3 (three) times daily. 90 tablet 6   hydrOXYzine (ATARAX) 10 MG tablet TAKE 1 TO 2 TABLETS BY MOUTH TWICE A DAY AS NEEDED FOR ANXIETY 30 tablet 0   Multiple Vitamin (MULTIVITAMIN WITH MINERALS) TABS tablet Take 1 tablet by mouth daily. Vita-Lea Women Multivitamn     psyllium (METAMUCIL) 58.6 % packet Take 1 packet by mouth daily as needed.     vitamin E 180 MG (400 UNITS) capsule Take 400 Units by mouth daily.     Cholecalciferol (VITAMIN D3) 50 MCG (2000 UT) TABS Take 2,000 Units by mouth daily.     predniSONE (DELTASONE) 20 MG tablet 2 tabs po for 4 days, then 1 tab po for 4 days 12 tablet 0   No facility-administered medications prior to visit.     Per HPI unless specifically indicated in ROS section below Review of Systems  Constitutional:  Negative for activity change, appetite change, chills, fatigue, fever and unexpected weight change.  HENT:  Negative for hearing loss.   Eyes:  Negative for visual disturbance.  Respiratory:  Negative for cough, chest tightness, shortness of breath and wheezing.   Cardiovascular:  Negative for chest pain, palpitations and leg swelling.  Gastrointestinal:  Negative for abdominal distention, abdominal pain, blood in stool, constipation, diarrhea, nausea and vomiting.  Genitourinary:  Negative for difficulty urinating and hematuria.  Musculoskeletal:  Negative for arthralgias, myalgias and neck pain.  Skin:  Negative for rash.  Neurological:  Negative for dizziness, seizures, syncope and headaches.  Hematological:  Negative for adenopathy. Does not bruise/bleed easily.  Psychiatric/Behavioral:  Negative for dysphoric mood. The patient is not nervous/anxious.        Mood stable with PRN hydroxyzine    Objective:  BP 134/78   Pulse 68   Temp (!) 97.1 F (36.2 C) (Temporal)   Ht '5\' 2"'$  (1.575 m)    Wt 144 lb 4 oz (65.4 kg)   LMP  (LMP Unknown)   SpO2 97%   BMI 26.38 kg/m   Wt Readings from Last 3 Encounters:  04/29/22 144 lb 4 oz (65.4 kg)  02/12/22 149 lb (67.6 kg)  12/24/21 148 lb 2 oz (67.2 kg)      Physical Exam Vitals and nursing note reviewed.  Constitutional:      Appearance: Normal appearance. She is not ill-appearing.  HENT:     Head: Normocephalic and atraumatic.     Right Ear: Tympanic membrane, ear canal and external ear  normal. There is no impacted cerumen.     Left Ear: Tympanic membrane, ear canal and external ear normal. There is no impacted cerumen.     Nose: Nose normal.     Mouth/Throat:     Mouth: Mucous membranes are moist.     Pharynx: Oropharynx is clear. No oropharyngeal exudate or posterior oropharyngeal erythema.  Eyes:     General:        Right eye: No discharge.        Left eye: No discharge.     Extraocular Movements: Extraocular movements intact.     Conjunctiva/sclera: Conjunctivae normal.     Pupils: Pupils are equal, round, and reactive to light.  Neck:     Thyroid: No thyroid mass or thyromegaly.  Cardiovascular:     Rate and Rhythm: Normal rate and regular rhythm.     Pulses: Normal pulses.     Heart sounds: Normal heart sounds. No murmur heard. Pulmonary:     Effort: Pulmonary effort is normal. No respiratory distress.     Breath sounds: Normal breath sounds. No wheezing, rhonchi or rales.  Abdominal:     General: Bowel sounds are normal. There is no distension.     Palpations: Abdomen is soft. There is no mass.     Tenderness: There is no abdominal tenderness. There is no guarding or rebound.     Hernia: No hernia is present.  Musculoskeletal:     Cervical back: Normal range of motion and neck supple. No rigidity.     Right lower leg: No edema.     Left lower leg: No edema.  Lymphadenopathy:     Cervical: No cervical adenopathy.  Skin:    General: Skin is warm and dry.     Findings: No rash.  Neurological:     General:  No focal deficit present.     Mental Status: She is alert. Mental status is at baseline.     Comments:  Recall 0/3  Calculation 3/5 DLORW  Psychiatric:        Mood and Affect: Mood normal.        Behavior: Behavior normal.       Results for orders placed or performed in visit on 04/29/22  CBC with Differential/Platelet  Result Value Ref Range   WBC 7.7 4.0 - 10.5 K/uL   RBC 4.64 3.87 - 5.11 Mil/uL   Hemoglobin 13.8 12.0 - 15.0 g/dL   HCT 40.2 36.0 - 46.0 %   MCV 86.6 78.0 - 100.0 fl   MCHC 34.3 30.0 - 36.0 g/dL   RDW 13.7 11.5 - 15.5 %   Platelets 171.0 150.0 - 400.0 K/uL   Neutrophils Relative % 62.9 43.0 - 77.0 %   Lymphocytes Relative 27.6 12.0 - 46.0 %   Monocytes Relative 6.7 3.0 - 12.0 %   Eosinophils Relative 1.8 0.0 - 5.0 %   Basophils Relative 1.0 0.0 - 3.0 %   Neutro Abs 4.9 1.4 - 7.7 K/uL   Lymphs Abs 2.1 0.7 - 4.0 K/uL   Monocytes Absolute 0.5 0.1 - 1.0 K/uL   Eosinophils Absolute 0.1 0.0 - 0.7 K/uL   Basophils Absolute 0.1 0.0 - 0.1 K/uL  T3  Result Value Ref Range   T3, Total 122 76 - 181 ng/dL  T4, free  Result Value Ref Range   Free T4 0.52 (L) 0.60 - 1.60 ng/dL  TSH  Result Value Ref Range   TSH 3.23 0.35 - 5.50 uIU/mL  Comprehensive metabolic  panel  Result Value Ref Range   Sodium 143 135 - 145 mEq/L   Potassium 3.8 3.5 - 5.1 mEq/L   Chloride 107 96 - 112 mEq/L   CO2 27 19 - 32 mEq/L   Glucose, Bld 90 70 - 99 mg/dL   BUN 19 6 - 23 mg/dL   Creatinine, Ser 1.22 (H) 0.40 - 1.20 mg/dL   Total Bilirubin 0.4 0.2 - 1.2 mg/dL   Alkaline Phosphatase 98 39 - 117 U/L   AST 15 0 - 37 U/L   ALT 13 0 - 35 U/L   Total Protein 6.3 6.0 - 8.3 g/dL   Albumin 4.1 3.5 - 5.2 g/dL   GFR 42.92 (L) >60.00 mL/min   Calcium 9.0 8.4 - 10.5 mg/dL  Lipid panel  Result Value Ref Range   Cholesterol 191 0 - 200 mg/dL   Triglycerides 156.0 (H) 0.0 - 149.0 mg/dL   HDL 54.10 >39.00 mg/dL   VLDL 31.2 0.0 - 40.0 mg/dL   LDL Cholesterol 106 (H) 0 - 99 mg/dL   Total  CHOL/HDL Ratio 4    NonHDL 136.97   Parathyroid hormone, intact (no Ca)  Result Value Ref Range   PTH 62 16 - 77 pg/mL  Microalbumin / creatinine urine ratio  Result Value Ref Range   Microalb, Ur <0.7 0.0 - 1.9 mg/dL   Creatinine,U 147.2 mg/dL   Microalb Creat Ratio 0.5 0.0 - 30.0 mg/g  VITAMIN D 25 Hydroxy (Vit-D Deficiency, Fractures)  Result Value Ref Range   VITD 17.25 (L) 30.00 - 100.00 ng/mL  Phosphorus  Result Value Ref Range   Phosphorus 3.7 2.3 - 4.6 mg/dL    Assessment & Plan:   Problem List Items Addressed This Visit     Medicare annual wellness visit, subsequent - Primary (Chronic)    I have personally reviewed the Medicare Annual Wellness questionnaire and have noted 1. The patient's medical and social history 2. Their use of alcohol, tobacco or illicit drugs 3. Their current medications and supplements 4. The patient's functional ability including ADL's, fall risks, home safety risks and hearing or visual impairment. Cognitive function has been assessed and addressed as indicated.  5. Diet and physical activity 6. Evidence for depression or mood disorders The patients weight, height, BMI have been recorded in the chart. I have made referrals, counseling and provided education to the patient based on review of the above and I have provided the pt with a written personalized care plan for preventive services. Provider list updated.. See scanned questionairre as needed for further documentation. Reviewed preventative protocols and updated unless pt declined.       Advanced care planning/counseling discussion (Chronic)    Advanced directives: thinks she has one at home. Asked to bring me copy. Doesn't want prolonged life support if terminal condition "don't want to be kept alive", ok for reversible condition. HCPOA is Karen [name] son then Karen [name] daughter but this may not have been updated.       Health maintenance examination (Chronic)    Preventative protocols reviewed  and updated unless pt declined. Discussed healthy diet and lifestyle.  # provided to call and schedule mammogram.       Adjustment disorder with mixed anxiety and depressed mood    She decided to come off Paxil last year and overall feels well off this.       Osteopenia    Overdue for DEXA - # provided to call and schedule.       CKD (  chronic kidney disease) stage 3, GFR 30-59 ml/min (HCC)    Update kidney function  Encouraged good hydration status, limiting nephrotoxic agents      HLD (hyperlipidemia)    Update levels off statin - she is not interested in further medication.  The 10-year ASCVD risk score (Arnett DK, et al., 2019) is: 21.3%   Values used to calculate the score:     Age: 43 years     Sex: Female     Is Non-Hispanic African American: No     Diabetic: No     Tobacco smoker: No     Systolic Blood Pressure: 161 mmHg     Is BP treated: No     HDL Cholesterol: 54.1 mg/dL     Total Cholesterol: 191 mg/dL       Hemorrhoid    Chronic issue s/p several hemorrhoid repairs - however no showed latest appt 12/2021. States this is no longer an issue.       Hypothyroidism (acquired)    She decided to stop levothyroxine but still takes "when I need it" Discussed this is not correct way to take thyroid replacement - rec stay off at this time if no hypothyroid symptoms.  H/o borderline hypothyroidism off replacement.       Chronic constipation    Chronic issue, managed with metamucil fiber supplement.      Vitamin D deficiency    Update levels on vit D 2000 IU daily however unsure how regular she is with this.       Memory difficulty    Continue to deny significant difficulty with memory however she misses 3 recall and has difficulty with selling WORLD backwards.  Also my staff noted her memory difficulty as she had to return to office for missed labwork and didn't remember having blood drawn earlier in the day.  Will ask her to return for repeat formal memory  assessment as she may benefit from memory medication and further assistance by family.  Last MMSE 11/2021 25/30, CDT 4/4.         No orders of the defined types were placed in this encounter.   No orders of the defined types were placed in this encounter.   Patient Instructions  Labs today Call to schedule mammogram and bone density scan at your convenience: [facility] [phone] You may be due for colonoscopy - check with Dr Candis Schatz.  If interested, check with pharmacy about new 2 shot shingles series (shingrix). If you've received it, let me know dates.  Schedule eye doctor and dentist appointment.   Follow up plan: Return in about 6 months (around 10/28/2022) for follow up visit.  Ria Bush, MD

## 2022-04-29 NOTE — Patient Instructions (Addendum)
Labs today Call to schedule mammogram and bone density scan at your convenience: [facility] [phone] You may be due for colonoscopy - check with Dr Candis Schatz.  If interested, check with pharmacy about new 2 shot shingles series (shingrix). If you've received it, let me know dates.  Schedule eye doctor and dentist appointment.

## 2022-04-29 NOTE — Telephone Encounter (Signed)
Patient called and asked if she was suppose to come back for an appointment.

## 2022-04-30 LAB — LIPID PANEL
Cholesterol: 191 mg/dL (ref 0–200)
HDL: 54.1 mg/dL (ref >39.00)
LDL Cholesterol: 106 mg/dL — ABNORMAL HIGH (ref 0–99)
NonHDL: 136.97
Total CHOL/HDL Ratio: 4
Triglycerides: 156 mg/dL — ABNORMAL HIGH (ref 0.0–149.0)
VLDL: 31.2 mg/dL (ref 0.0–40.0)

## 2022-04-30 LAB — COMPREHENSIVE METABOLIC PANEL
ALT: 13 U/L (ref 0–35)
AST: 15 U/L (ref 0–37)
Albumin: 4.1 g/dL (ref 3.5–5.2)
Alkaline Phosphatase: 98 U/L (ref 39–117)
BUN: 19 mg/dL (ref 6–23)
CO2: 27 mEq/L (ref 19–32)
Calcium: 9 mg/dL (ref 8.4–10.5)
Chloride: 107 mEq/L (ref 96–112)
Creatinine, Ser: 1.22 mg/dL — ABNORMAL HIGH (ref 0.40–1.20)
GFR: 42.92 mL/min — ABNORMAL LOW (ref >60.00)
Glucose, Bld: 90 mg/dL (ref 70–99)
Potassium: 3.8 mEq/L (ref 3.5–5.1)
Sodium: 143 mEq/L (ref 135–145)
Total Bilirubin: 0.4 mg/dL (ref 0.2–1.2)
Total Protein: 6.3 g/dL (ref 6.0–8.3)

## 2022-04-30 LAB — CBC WITH DIFFERENTIAL/PLATELET
Basophils Absolute: 0.1 10*3/uL (ref 0.0–0.1)
Basophils Relative: 1 % (ref 0.0–3.0)
Eosinophils Absolute: 0.1 10*3/uL (ref 0.0–0.7)
Eosinophils Relative: 1.8 % (ref 0.0–5.0)
HCT: 40.2 % (ref 36.0–46.0)
Hemoglobin: 13.8 g/dL (ref 12.0–15.0)
Lymphocytes Relative: 27.6 % (ref 12.0–46.0)
Lymphs Abs: 2.1 10*3/uL (ref 0.7–4.0)
MCHC: 34.3 g/dL (ref 30.0–36.0)
MCV: 86.6 fl (ref 78.0–100.0)
Monocytes Absolute: 0.5 10*3/uL (ref 0.1–1.0)
Monocytes Relative: 6.7 % (ref 3.0–12.0)
Neutro Abs: 4.9 10*3/uL (ref 1.4–7.7)
Neutrophils Relative %: 62.9 % (ref 43.0–77.0)
Platelets: 171 10*3/uL (ref 150.0–400.0)
RBC: 4.64 Mil/uL (ref 3.87–5.11)
RDW: 13.7 % (ref 11.5–15.5)
WBC: 7.7 10*3/uL (ref 4.0–10.5)

## 2022-04-30 LAB — MICROALBUMIN / CREATININE URINE RATIO
Creatinine,U: 147.2 mg/dL
Microalb Creat Ratio: 0.5 mg/g (ref 0.0–30.0)
Microalb, Ur: <0.7 mg/dL (ref 0.0–1.9)

## 2022-04-30 LAB — T4, FREE: Free T4: 0.52 ng/dL — ABNORMAL LOW (ref 0.60–1.60)

## 2022-04-30 LAB — VITAMIN D 25 HYDROXY (VIT D DEFICIENCY, FRACTURES): VITD: 17.25 ng/mL — ABNORMAL LOW (ref 30.00–100.00)

## 2022-04-30 LAB — PARATHYROID HORMONE, INTACT (NO CA): PTH: 62 pg/mL (ref 16–77)

## 2022-04-30 LAB — PHOSPHORUS: Phosphorus: 3.7 mg/dL (ref 2.3–4.6)

## 2022-04-30 LAB — TSH: TSH: 3.23 u[IU]/mL (ref 0.35–5.50)

## 2022-04-30 LAB — T3: T3, Total: 122 ng/dL (ref 76–181)

## 2022-05-03 ENCOUNTER — Encounter: Payer: Self-pay | Admitting: Family Medicine

## 2022-05-03 NOTE — Assessment & Plan Note (Signed)
Update levels off statin - she is not interested in further medication.  The 10-year ASCVD risk score (Arnett DK, et al., 2019) is: 21.3%   Values used to calculate the score:     Age: 77 years     Sex: Female     Is Non-Hispanic African American: No     Diabetic: No     Tobacco smoker: No     Systolic Blood Pressure: 572 mmHg     Is BP treated: No     HDL Cholesterol: 54.1 mg/dL     Total Cholesterol: 191 mg/dL

## 2022-05-03 NOTE — Assessment & Plan Note (Addendum)
Preventative protocols reviewed and updated unless pt declined. Discussed healthy diet and lifestyle.  # provided to call and schedule mammogram.  

## 2022-05-03 NOTE — Assessment & Plan Note (Signed)
Overdue for DEXA - # provided to call and schedule.

## 2022-05-03 NOTE — Assessment & Plan Note (Signed)

## 2022-05-03 NOTE — Assessment & Plan Note (Signed)
Chronic issue s/p several hemorrhoid repairs - however no showed latest appt 12/2021. States this is no longer an issue.

## 2022-05-03 NOTE — Assessment & Plan Note (Signed)
Advanced directives: thinks she has one at home. Asked to bring me copy. Doesn't want prolonged life support if terminal condition "don't want to be kept alive", ok for reversible condition. HCPOA is Sherren Mocha son then Butch Penny daughter but this may not have been updated.

## 2022-05-03 NOTE — Assessment & Plan Note (Addendum)
Chronic issue, managed with metamucil fiber supplement.

## 2022-05-03 NOTE — Assessment & Plan Note (Addendum)
Continue to deny significant difficulty with memory however she misses 3 recall and has difficulty with selling WORLD backwards.  Also my staff noted her memory difficulty as she had to return to office for missed labwork and didn't remember having blood drawn earlier in the day.  Will ask her to return for repeat formal memory assessment as she may benefit from memory medication and further assistance by family.  Last MMSE 11/2021 25/30, CDT 4/4.

## 2022-05-03 NOTE — Assessment & Plan Note (Signed)
She decided to stop levothyroxine but still takes "when I need it" Discussed this is not correct way to take thyroid replacement - rec stay off at this time if no hypothyroid symptoms.  H/o borderline hypothyroidism off replacement.

## 2022-05-03 NOTE — Assessment & Plan Note (Signed)
Update kidney function  Encouraged good hydration status, limiting nephrotoxic agents

## 2022-05-03 NOTE — Assessment & Plan Note (Signed)
Update levels on vit D 2000 IU daily however unsure how regular she is with this.

## 2022-05-03 NOTE — Assessment & Plan Note (Addendum)
She decided to come off Paxil last year and overall feels well off this.

## 2022-05-05 ENCOUNTER — Other Ambulatory Visit: Payer: Self-pay | Admitting: Family Medicine

## 2022-05-05 MED ORDER — VITAMIN D3 1.25 MG (50000 UT) PO TABS: 1.0000 | ORAL_TABLET | ORAL | 1 refills | Status: DC

## 2022-05-06 DIAGNOSIS — R52 Pain, unspecified: Secondary | ICD-10-CM | POA: Diagnosis not present

## 2022-05-12 ENCOUNTER — Telehealth: Payer: Self-pay

## 2022-05-12 ENCOUNTER — Ambulatory Visit: Payer: Medicare PPO | Admitting: Family Medicine

## 2022-05-12 DIAGNOSIS — F4323 Adjustment disorder with mixed anxiety and depressed mood: Secondary | ICD-10-CM

## 2022-05-12 MED ORDER — TRAZODONE HCL 50 MG PO TABS: 25.0000 mg | ORAL_TABLET | Freq: Every evening | ORAL | 1 refills | Status: DC | PRN

## 2022-05-12 NOTE — Telephone Encounter (Signed)
[facility] Night - Client TELEPHONE ADVICE RECORD [facility] Patient Name: [facility] [place] [facility] Gender: Female DOB: Apr 07, 1945 Age: 77 Y 2 M Return Phone Number: [phone] (Primary) Address: City/ State/ Zip: [place] [address] Client [place] Night - Client Client Site [place] Provider Ria Bush - MD Contact Type Call Who Is Calling Patient / Member / Family / Caregiver Call Type Triage / Clinical Relationship To Patient Self Return Phone Number [phone] (Primary) Chief Complaint Medication Question (non symptomatic) Reason for Call Medication Question / Request Initial Comment Caller states she has a medication question. She states she cant sleep. Translation No Nurse Assessment Nurse: Agustina Caroli, RN, Mendel Ryder Date/Time (Eastern Time): 05/10/2022 8:20:44 AM Confirm and document reason for call. If symptomatic, describe symptoms. ---Caller states she cant sleep at night recently. Caller states that she doesnt currently take anything for sleeping but having some difficulties recently. Does the patient have any new or worsening symptoms? ---Yes Will a triage be completed? ---Yes Related visit to physician within the last 2 weeks? ---Yes Does the PT have any chronic conditions? (i.e. diabetes, asthma, this includes High risk factors for pregnancy, etc.) ---Yes Is this a behavioral health or substance abuse call? ---No Guidelines Guideline Title Affirmed Question Affirmed Notes Nurse Date/Time (Eastern Time) Insomnia Requesting medication for sleep ("sleeping pill") Popejoy, RN, Mendel Ryder 05/10/2022 8:26:39 AM Disp. Time [name] Time) Disposition Final User 05/10/2022 8:28:52 AM SEE PCP WITHIN 3 DAYS Yes Popejoy, RN, Mendel Ryder Final Disposition 05/10/2022 8:28:52 AM SEE PCP WITHIN 3 DAYS Yes Popejoy, RN, Mendel Ryder PLEASE NOTE: All timestamps contained within this report are  represented as [place] Standard Time. CONFIDENTIALTY NOTICE: This fax transmission is intended only for the addressee. It contains information that is legally privileged, confidential or otherwise protected from use or disclosure. If you are not the intended recipient, you are strictly prohibited from reviewing, disclosing, copying using or disseminating any of this information or taking any action in reliance on or regarding this information. If you have received this fax in error, please notify us immediately by telephone so that we can arrange for its return to [place]. Phone: [phone], Toll-Free: [phone], Fax: [phone] Page: 2 of 2 Call Id: [id number] Adelino Disagree/Comply Comply Caller Understands Yes PreDisposition Call Doctor Care Advice Given Per Guideline SEE PCP WITHIN 3 DAYS: * You need to be seen within 2 or 3 days. REQUEST FOR SLEEPING PILL PRESCRIPTION: TIPS FOR GOOD SLEEP: * Take a warm bath or shower before bedtime. TIPS FOR GOOD SLEEP - YOUR BEDROOM: CALL BACK IF: * You become worse CARE ADVICE given per Insomnia (Adult) guideline

## 2022-05-12 NOTE — Telephone Encounter (Signed)
Pt said that she is having problems going to sleep. Pt said she just lays there and cannot go to sleep. Pt said she Is out of her Hydroxyzine 10 mg where pt taking 2 tabs daily usually.pt said hydroxyzine usually makes pt sleepy. Pt does not want to schedule appt since recently seen for medicare wellness. Pt request cb after note is reviewed by Dr Darnell Level. Pt last refilled hydroxyzine 10 mg # 30 on 04/23/22. Sending note to Dr Darnell Level and G pool. [facility] [place].

## 2022-05-12 NOTE — Telephone Encounter (Signed)
I'd like her to try trazodone 25-50 mg 1/2-1 tab at night as needed for sleep  Let us know how she does with this medicine.

## 2022-05-12 NOTE — Telephone Encounter (Signed)
Patient notified as instructed by telephone and verbalized understanding. 

## 2022-05-14 NOTE — Telephone Encounter (Signed)
[place] Night - Client Nonclinical Telephone Record  [facility] Client [facility] Night - Client Client Site [place] Provider Ria Bush - MD Contact Type Call Who Is Calling Patient / Member / Family / Caregiver Caller Name [place] Phone Number [phone] Patient Name Karen [name] Patient DOB 05/22/1945 Call Type Message Only Information Provided Reason for Call Request for General Office Information Initial Comment Caller states she is having diarrhea after taking medication prescribed she is unable to take it. Additional Comment Office hours provided. Disp. Time Disposition Final User 05/13/2022 6:30:22 PM General Information Provided Yes [place], [place]ara Call Closed By: Doran Durand Transaction Date/Time: 05/13/2022 6:24:57 PM (ET   I spoke with pt; pt said she did not take trazodone last night and has not had diarrhea so far this morning. Pt will cb with update if diarrhea returns. Pt said does not need anything else now. Sending note as FYI to Dr Darnell Level.

## 2022-05-14 NOTE — Telephone Encounter (Addendum)
Noted. Trazodone may have contributed to diarrhea but not definitive

## 2022-05-19 ENCOUNTER — Telehealth: Payer: Self-pay

## 2022-05-19 DIAGNOSIS — F4323 Adjustment disorder with mixed anxiety and depressed mood: Secondary | ICD-10-CM

## 2022-05-19 MED ORDER — HYDROXYZINE HCL 10 MG PO TABS: 10.0000 mg | ORAL_TABLET | Freq: Two times a day (BID) | ORAL | 0 refills | Status: DC | PRN

## 2022-05-19 NOTE — Telephone Encounter (Signed)
I unable to reach pt by phone; last hydroxyzine 10 mg # 30 filled on 04/23/22. Pt had medicare annual exam on 04/29/2022. [facility] [place]. Sending note to Dr Darnell Level and G pool.   [place] Night - Client TELEPHONE ADVICE RECORD [facility] Patient Name: [facility] [place] Gender: Female DOB: 09-Sep-1945 Age: 77 Y 2 M 8 D Return Phone Number: [phone] (Primary), [phone] (Secondary) Address: City/ State/ Zip: [place] [address] Client [place] Night - Client Client Site [place] Provider Ria Bush - MD Contact Type Call Who Is Calling Patient / Member / Family / Caregiver Call Type Triage / Clinical Relationship To Patient Self Return Phone Number [phone] (Primary) Chief Complaint Prescription Refill or Medication Request (non symptomatic) Reason for Call Medication Question / Request Initial Comment The caller states she lost her bottle of her medication and is completely out - Anxiety medication. No symptoms. Translation No Nurse Assessment Nurse: Micheline Chapman, RN, Joelene Millin Date/Time (Eastern Time): 05/18/2022 8:10:40 AM Confirm and document reason for call. If symptomatic, describe symptoms. ---Caller states she is out of her anxiety medication. Med [facility] hydroxyzine [facility] mg PRN. [facility] [address] [facility] [place], [place]. [phone] Denies any s/s or need for triage. Does the patient have any new or worsening symptoms? ---No Disp. Time [name] Time) Disposition Final User 05/18/2022 8:35:15 AM Pharmacy Call Micheline Chapman, RN, Joelene Millin Reason: [phone]. Left vm. 05/18/2022 8:35:33 AM Clinical Call Yes Micheline Chapman, RN, Joelene Millin Final Disposition 05/18/2022 8:35:33 AM Clinical Call Yes Micheline Chapman, RN, Sharlyne Pacas

## 2022-05-19 NOTE — Telephone Encounter (Signed)
Rx refilled.

## 2022-06-13 ENCOUNTER — Telehealth: Payer: Self-pay | Admitting: Family Medicine

## 2022-06-13 ENCOUNTER — Other Ambulatory Visit: Payer: Self-pay | Admitting: Family Medicine

## 2022-06-13 DIAGNOSIS — F4323 Adjustment disorder with mixed anxiety and depressed mood: Secondary | ICD-10-CM

## 2022-06-13 NOTE — Telephone Encounter (Signed)
Prescription Request  06/13/2022  LOV: 04/29/2022  What is the name of the medication or equipment? hydrOXYzine (ATARAX) 10 MG tablet   Have you contacted your pharmacy to request a refill? Yes   Which pharmacy would you like this sent to?  [id number], [place] [address] [place] [address] Phone: [phone] Fax: [phone]    Patient notified that their request is being sent to the clinical staff for review and that they should receive a response within 2 business days.   Please advise at [facility] [phone]

## 2022-06-13 NOTE — Telephone Encounter (Signed)
Refill sent to [facility] earlier this AM. (See 06/13/22 refill note.)

## 2022-06-16 ENCOUNTER — Telehealth: Payer: Self-pay | Admitting: Family Medicine

## 2022-06-16 ENCOUNTER — Other Ambulatory Visit: Payer: Self-pay

## 2022-06-16 NOTE — Telephone Encounter (Signed)
Pt called in requesting a refill on RX Levothyroxine 50 mg . Medication is no longer on pt med list .please Advise #  [phone]

## 2022-06-16 NOTE — Telephone Encounter (Signed)
Left message on voicemail for patient to call the office back. 

## 2022-06-17 NOTE — Telephone Encounter (Signed)
Per 04/29/22 OV notes, pt informed Dr. Darnell Level she decided to stop levothyroxine but still takes "when I need it".  Lvm asking pt to call back. Need to know if pt has restarted levothyroxine once a day.

## 2022-06-18 NOTE — Telephone Encounter (Signed)
Lvm asking pt to call back.  Need to relay Dr. G's message.  

## 2022-06-18 NOTE — Telephone Encounter (Signed)
Left a message on voicemail for patient to call the office back. 

## 2022-06-18 NOTE — Telephone Encounter (Addendum)
This is not an as needed medication. She either needs to take daily or not take at all. I recommend staying off medication at this time as her thyroid levels were normal off med last check, we can check thyroid levels next time we draw blood.   Lab Results  Component Value Date   TSH 3.23 04/29/2022

## 2022-06-18 NOTE — Telephone Encounter (Signed)
[place] Night - Client Nonclinical Telephone Record  [facility] Client [place] Night - Client Client Site [place] Provider Ria Bush - MD Contact Type Call Who Is Calling Patient / Member / Family / Caregiver Caller Name Karen [name] Phone Number [phone] Call Type Message Only Information Provided Reason for Call Returning a Call from the Office Initial Comment Caller states she's returning a call from the office. Additional Comment Office hours provided. Disp. Time Disposition Final User 06/17/2022 8:01:16 PM General Information Provided Yes Valentino Nose Call Closed By: Valentino Nose Transaction Date/Time: 06/17/2022 7:58:25 PM (ET  Sending note to [place] pool.

## 2022-06-19 NOTE — Telephone Encounter (Signed)
Lvm asking pt to call back.  Need to relay Dr. G's message.   Mailing a letter.  

## 2022-07-11 ENCOUNTER — Other Ambulatory Visit: Payer: Self-pay | Admitting: Family Medicine

## 2022-07-11 ENCOUNTER — Telehealth: Payer: Self-pay | Admitting: Family Medicine

## 2022-07-11 DIAGNOSIS — F4323 Adjustment disorder with mixed anxiety and depressed mood: Secondary | ICD-10-CM

## 2022-07-11 NOTE — Telephone Encounter (Signed)
Prescription Request  07/11/2022  LOV: 04/29/2022  What is the name of the medication or equipment? hydrOXYzine (ATARAX) 10 MG tablet   Have you contacted your pharmacy to request a refill? Yes   Which pharmacy would you like this sent to?  [name], [place] - [name] [place] [address] Phone: [phone] Fax: [phone]    Patient notified that their request is being sent to the clinical staff for review and that they should receive a response within 2 business days.   Please advise at [facility] [phone]  Patient states she is out of this medication, has had a hard time getting this filled.

## 2022-07-14 NOTE — Telephone Encounter (Signed)
ERx 

## 2022-07-14 NOTE — Telephone Encounter (Signed)
ERx via med refill

## 2022-07-14 NOTE — Telephone Encounter (Signed)
This is a duplicate request.  See refill request from pharmacy.

## 2022-07-14 NOTE — Telephone Encounter (Addendum)
Refill  request Hydroxyzine Last office visit 04/29/22 Last refill 06/13/22  #30

## 2022-07-19 ENCOUNTER — Other Ambulatory Visit: Payer: Self-pay | Admitting: Family Medicine

## 2022-07-19 DIAGNOSIS — F4323 Adjustment disorder with mixed anxiety and depressed mood: Secondary | ICD-10-CM

## 2022-07-21 NOTE — Telephone Encounter (Signed)
Refill request  Trazodone last refill 05/12/22 #30/1 Hydroxyzine last refill 07/13/21 #30 Last office visit 04/29/22

## 2022-07-21 NOTE — Telephone Encounter (Signed)
ERx Hydroxyzine denied as refilled 1 wk ago

## 2022-08-01 ENCOUNTER — Ambulatory Visit: Payer: Medicare PPO | Admitting: Family Medicine

## 2022-08-06 ENCOUNTER — Ambulatory Visit: Payer: Medicare PPO | Admitting: Family Medicine

## 2022-08-08 ENCOUNTER — Ambulatory Visit: Payer: Medicare PPO | Admitting: Family Medicine

## 2022-08-08 ENCOUNTER — Encounter: Payer: Self-pay | Admitting: Family Medicine

## 2022-08-08 VITALS — BP 126/66 | HR 86 | Temp 96.9°F | Ht 62.0 in | Wt 143.5 lb

## 2022-08-08 DIAGNOSIS — E559 Vitamin D deficiency, unspecified: Secondary | ICD-10-CM

## 2022-08-08 DIAGNOSIS — R490 Dysphonia: Secondary | ICD-10-CM

## 2022-08-08 DIAGNOSIS — R413 Other amnesia: Secondary | ICD-10-CM

## 2022-08-08 DIAGNOSIS — G3184 Mild cognitive impairment, so stated: Secondary | ICD-10-CM | POA: Diagnosis not present

## 2022-08-08 DIAGNOSIS — F4323 Adjustment disorder with mixed anxiety and depressed mood: Secondary | ICD-10-CM | POA: Diagnosis not present

## 2022-08-08 DIAGNOSIS — E039 Hypothyroidism, unspecified: Secondary | ICD-10-CM | POA: Diagnosis not present

## 2022-08-08 NOTE — Progress Notes (Addendum)
Ph: [phone]       Fax: [phone]   Patient ID: Karen [name], female    DOB: 09/01/1945, 77 y.o.   MRN: [id number]  This visit was conducted in person.  BP 126/66   Pulse 86   Temp (!) 96.9 F (36.1 C) (Temporal)   Ht 5\' 2"  (1.575 m)   Wt 143 lb 8 oz (65.1 kg)   LMP  (LMP Unknown)   SpO2 98%   BMI 26.25 kg/m    CC: 3 mo f/u visit  Subjective:   HPI: Karen [name] is a 77 y.o. female presenting on 08/08/2022 for Medical Management of Chronic Issues (Here for kidneys and memory f/u.)   Hoarseness for a few days - attributes to allergies. No ST, fever, congestion, PNDrainage.  Denies UTI symptoms.   There's been some ongoing concern with memory difficulty.  Previous MMSE 25/30, CDT 4/4 (11/2021).  Fhmx CAD. Older sister with dementia.   Geriatric Assessment: Activities of Daily Living:     Bathing- independent    Dressing- independent    Eating- independent    Toileting- independent    Transferring- independent    Continence- independent Overall Assessment:   Instrumental Activities of Daily Living:     Transportation- independent    Meal/Food Preparation- independent    Shopping Errands- independent    Housekeeping/Chores- independent    Money Management/Finances-  independent    Medication Management- independent    Ability to Use Telephone- independent    Laundry- independent Overall Assessment:  independent  Mental Status Exam: 22/30 (value/max value)    0/3 recall Clock Drawing Score: 4/4     Relevant past medical, surgical, family and social history reviewed and updated as indicated. Interim medical history since our last visit reviewed. Allergies and medications reviewed and updated. Outpatient Medications Prior to Visit  Medication Sig Dispense Refill   acetaminophen (TYLENOL) 500 MG tablet Take 1 tablet (500 mg total) by mouth every 6 (six) hours as needed.     calcium-vitamin D (OSCAL WITH D) 500-200 MG-UNIT tablet Take 1 tablet by mouth 3  (three) times daily. 90 tablet 6   Multiple Vitamin (MULTIVITAMIN WITH MINERALS) TABS tablet Take 1 tablet by mouth daily. Vita-Lea Women Multivitamn     psyllium (METAMUCIL) 58.6 % packet Take 1 packet by mouth daily as needed.     traZODone (DESYREL) 50 MG tablet TAKE 0.5-1 TABLETS BY MOUTH AT BEDTIME AS NEEDED FOR SLEEP. 30 tablet 1   vitamin E 180 MG (400 UNITS) capsule Take 400 Units by mouth daily.     Cholecalciferol (VITAMIN D3) 1.25 MG (50000 UT) TABS Take 1 tablet by mouth once a week. 12 tablet 1   hydrOXYzine (ATARAX) 10 MG tablet Take 1 tablet (10 mg total) by mouth 2 (two) times daily as needed for anxiety. 30 tablet 0   No facility-administered medications prior to visit.     Per HPI unless specifically indicated in ROS section below Review of Systems  Objective:  BP 126/66   Pulse 86   Temp (!) 96.9 F (36.1 C) (Temporal)   Ht 5\' 2"  (1.575 m)   Wt 143 lb 8 oz (65.1 kg)   LMP  (LMP Unknown)   SpO2 98%   BMI 26.25 kg/m   Wt Readings from Last 3 Encounters:  08/08/22 143 lb 8 oz (65.1 kg)  04/29/22 144 lb 4 oz (65.4 kg)  02/12/22 149 lb (67.6 kg)      Physical Exam Vitals  and nursing note reviewed.  Constitutional:      Appearance: Normal appearance. She is not ill-appearing.  HENT:     Head: Normocephalic and atraumatic.     Mouth/Throat:     Mouth: Mucous membranes are moist.     Pharynx: Oropharynx is clear. No oropharyngeal exudate or posterior oropharyngeal erythema.  Eyes:     Extraocular Movements: Extraocular movements intact.     Pupils: Pupils are equal, round, and reactive to light.  Neck:     Thyroid: No thyroid mass, thyromegaly or thyroid tenderness.  Cardiovascular:     Rate and Rhythm: Normal rate and regular rhythm.     Pulses: Normal pulses.     Heart sounds: Normal heart sounds. No murmur heard. Pulmonary:     Effort: Pulmonary effort is normal. No respiratory distress.     Breath sounds: Normal breath sounds. No wheezing, rhonchi or  rales.  Musculoskeletal:     Cervical back: Normal range of motion and neck supple. No tenderness.     Right lower leg: No edema.     Left lower leg: No edema.  Lymphadenopathy:     Cervical: No cervical adenopathy.  Skin:    General: Skin is warm and dry.     Findings: No rash.  Neurological:     Mental Status: She is alert.  Psychiatric:        Behavior: Behavior normal.       Results for orders placed or performed in visit on 08/08/22  RPR  Result Value Ref Range   RPR Ser Ql REACTIVE (A) NON-REACTIVE  Vitamin B12  Result Value Ref Range   Vitamin B-12 301 200 - 1,100 pg/mL  VITAMIN D 25 Hydroxy (Vit-D Deficiency, Fractures)  Result Value Ref Range   Vit D, 25-Hydroxy 24 (L) 30 - 100 ng/mL  Rpr titer  Result Value Ref Range   RPR Titer 1:1 (H)   T PALLIDUM AB  Result Value Ref Range   T Pallidum Abs NEGATIVE NEGATIVE   Lab Results  Component Value Date   TSH 3.23 04/29/2022    Assessment & Plan:  Again provided with #s to reach out to schedule mammo/dexa/colonoscopy as due.   Problem List Items Addressed This Visit     Adjustment disorder with mixed anxiety and depressed mood    She will remain off Paxil.       Hypothyroidism (acquired)    She remains off regular levothyroxine, states occ taking PRN - advised to fully stop.       Vitamin D deficiency    She continues vit D3 50000 IU weekly. Will update vit D levels today, will refill.       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  Discussed with patient concern over short term memory difficulties.  She remains independent in ADLs without noted deterioration in executive function - therefore only qualifies for MCI diagnosis. She denies family members noting concerns with her memory.  Reviewed 4 core strategies to  keep a healthy mind.  Check memory labs including B12.  Consider starting cholinergic medication such as aricept.       Hoarseness of voice    Short term, present for last few days, benign exam today - anticipate allergy related. To let me know if ongoing past 1-2 wks.  Suggested she start OTC antihistamine like claritin or allegra PRN        Meds ordered this encounter  Medications   Cholecalciferol (VITAMIN D3) 1.25 MG (50000 UT) TABS    Sig: Take 1 tablet by mouth once a week.    Dispense:  12 tablet    Refill:  3   cyanocobalamin (VITAMIN B12) 1000 MCG tablet    Sig: Take 1 tablet (1,000 mcg total) by mouth daily.    Orders Placed This Encounter  Procedures   RPR   Vitamin B12   VITAMIN D 25 Hydroxy (Vit-D Deficiency, Fractures)   Rpr titer   T PALLIDUM AB    Patient Instructions  Memory testing raises question of short term memory loss.  Labs today to check blood work important for memory.  Let me know if hoarseness doesn't improve over the next 1-2 weeks.  For allergies, take allegra or claritin over the counter as needed.   Work on 4 core lifestyle modifications to support a healthy mind:  1. Nutritious well balance diet.  2. Regular physical activity routine.  3. Regular mental activity such as reading books, word puzzles, math puzzles, jigsaw puzzles.  4. Social engagement.  Also ensure good blood pressure control, limit alcohol, no smoking.    Call to schedule mammogram and bone density scan at your convenience: [facility] [phone] You may be due for colonoscopy - check with Dr Tomasa Rand at [phone].   Follow up plan: Return if symptoms worsen or fail to improve.  Eustaquio Boyden, MD

## 2022-08-08 NOTE — Patient Instructions (Addendum)
Memory testing raises question of short term memory loss.  Labs today to check blood work important for memory.  Let me know if hoarseness doesn't improve over the next 1-2 weeks.  For allergies, take allegra or claritin over the counter as needed.   Work on 4 core lifestyle modifications to support a healthy mind:  1. Nutritious well balance diet.  2. Regular physical activity routine.  3. Regular mental activity such as reading books, word puzzles, math puzzles, jigsaw puzzles.  4. Social engagement.  Also ensure good blood pressure control, limit alcohol, no smoking.    Call to schedule mammogram and bone density scan at your convenience: [facility] [phone] You may be due for colonoscopy - check with Dr Tomasa Rand at [phone].

## 2022-08-09 ENCOUNTER — Other Ambulatory Visit: Payer: Self-pay | Admitting: Family Medicine

## 2022-08-09 DIAGNOSIS — F4323 Adjustment disorder with mixed anxiety and depressed mood: Secondary | ICD-10-CM

## 2022-08-09 DIAGNOSIS — R490 Dysphonia: Secondary | ICD-10-CM | POA: Insufficient documentation

## 2022-08-09 LAB — RPR TITER: RPR Titer: 1:1 {titer} — ABNORMAL HIGH

## 2022-08-09 MED ORDER — VITAMIN D3 1.25 MG (50000 UT) PO TABS: 1.0000 | ORAL_TABLET | ORAL | 3 refills | Status: DC

## 2022-08-09 MED ORDER — VITAMIN B-12 1000 MCG PO TABS: 1000.0000 ug | ORAL_TABLET | Freq: Every day | ORAL | Status: DC

## 2022-08-09 NOTE — Assessment & Plan Note (Addendum)
She continues vit D3 50000 IU weekly. Will update vit D levels today, will refill.

## 2022-08-09 NOTE — Assessment & Plan Note (Addendum)
Predominant short term memory loss with abnormal MMSE 22/30, with 0/3 recall.  Discussed with patient concern over short term memory difficulties.  She remains independent in ADLs without noted deterioration in executive function - therefore only qualifies for MCI diagnosis. She denies family members noting concerns with her memory.  Reviewed 4 core strategies to keep a healthy mind.  Check memory labs including B12.  Consider starting cholinergic medication such as aricept.

## 2022-08-09 NOTE — Assessment & Plan Note (Signed)
She remains off regular levothyroxine, states occ taking PRN - advised to fully stop.

## 2022-08-09 NOTE — Assessment & Plan Note (Addendum)
Short term, present for last few days, benign exam today - anticipate allergy related. To let me know if ongoing past 1-2 wks.  Suggested she start OTC antihistamine like claritin or allegra PRN

## 2022-08-09 NOTE — Assessment & Plan Note (Signed)
She will remain off Paxil.

## 2022-08-12 LAB — VITAMIN D 25 HYDROXY (VIT D DEFICIENCY, FRACTURES): Vit D, 25-Hydroxy: 24 ng/mL — ABNORMAL LOW (ref 30–100)

## 2022-08-12 LAB — RPR: RPR Ser Ql: REACTIVE — AB

## 2022-08-12 LAB — T PALLIDUM AB: T Pallidum Abs: NEGATIVE

## 2022-08-12 LAB — VITAMIN B12: Vitamin B-12: 301 pg/mL (ref 200–1100)

## 2022-08-13 ENCOUNTER — Telehealth: Payer: Self-pay

## 2022-08-13 NOTE — Telephone Encounter (Signed)
Pt rtn call. I relayed results and Dr. Timoteo Expose message. Pt verbalizes understanding and declines starting memory med at this time.

## 2022-08-13 NOTE — Telephone Encounter (Addendum)
Lvm asking pt to call back. Need to relay results and Dr. Timoteo Expose message. (See Labs, Result Notes- 08/08/22.)  Labs/Dr. Timoteo Expose msg: Your vitamin D remains low but improved- continue vit D3 weekly replacement, which I refilled to pharmacy.  Your vitamin B12 was in low normal range. I recommend you start vit B12 1000 mcg daily over-the-counter (OTC).  Syphilis not likely   We can offer starting a memory medicine called Aricept, which could help prevent worsening memory difficulty. If started, we would have to watch for GI upset or new or worse palpitations.

## 2022-08-22 ENCOUNTER — Other Ambulatory Visit: Payer: Self-pay | Admitting: Family Medicine

## 2022-08-22 DIAGNOSIS — F4323 Adjustment disorder with mixed anxiety and depressed mood: Secondary | ICD-10-CM

## 2022-08-22 NOTE — Telephone Encounter (Signed)
Too soon. Rx sent on 08/11/22, #30/0 to [facility].   Request declined.

## 2022-08-26 NOTE — Telephone Encounter (Signed)
Noted  

## 2022-09-10 ENCOUNTER — Other Ambulatory Visit: Payer: Self-pay | Admitting: Family Medicine

## 2022-09-10 DIAGNOSIS — F4323 Adjustment disorder with mixed anxiety and depressed mood: Secondary | ICD-10-CM

## 2022-09-10 NOTE — Telephone Encounter (Signed)
ERx. Plz notify patient - this shouldn't be a daily medicine but rather only to use as needed for anxiety, sparingly.

## 2022-09-10 NOTE — Telephone Encounter (Signed)
Patient called in and stated that she just took last one today. Thank you!

## 2022-09-10 NOTE — Telephone Encounter (Signed)
Refill request Hydroxyzine Last office visit 08/08/22 Last refill 08/11/22 #30

## 2022-09-11 NOTE — Telephone Encounter (Signed)
Spoke with pt relaying Dr. Timoteo Expose message. Pt says she was taking daily but will now only take when feeling anxious.

## 2022-09-22 ENCOUNTER — Other Ambulatory Visit: Payer: Self-pay | Admitting: Family Medicine

## 2022-09-22 DIAGNOSIS — F4323 Adjustment disorder with mixed anxiety and depressed mood: Secondary | ICD-10-CM

## 2022-09-24 NOTE — Telephone Encounter (Signed)
Too soon. Dr. Reece Agar reminded pt rx is PRN, not daily(see 09/10/22 refill note). Rx sent 09/10/22, #30/0 to [facility].   Request denied.

## 2022-09-28 ENCOUNTER — Other Ambulatory Visit: Payer: Self-pay | Admitting: Family Medicine

## 2022-09-28 DIAGNOSIS — F4323 Adjustment disorder with mixed anxiety and depressed mood: Secondary | ICD-10-CM

## 2022-09-29 NOTE — Telephone Encounter (Signed)
Hydroxyzine Last filled:  09/10/22, #30 Last OV:  08/08/22, kidneys; memory f/u Next OV:  10/28/22, 6 mo f/u

## 2022-10-01 ENCOUNTER — Other Ambulatory Visit: Payer: Self-pay | Admitting: Family Medicine

## 2022-10-01 DIAGNOSIS — G4700 Insomnia, unspecified: Secondary | ICD-10-CM

## 2022-10-13 ENCOUNTER — Ambulatory Visit: Payer: Medicare PPO | Admitting: Family Medicine

## 2022-10-14 ENCOUNTER — Other Ambulatory Visit: Payer: Self-pay | Admitting: Family Medicine

## 2022-10-14 DIAGNOSIS — F4323 Adjustment disorder with mixed anxiety and depressed mood: Secondary | ICD-10-CM

## 2022-10-14 NOTE — Telephone Encounter (Signed)
Too soon. Rx sent on 10/01/22, #30/0 to [facility].   Request denied.

## 2022-10-20 ENCOUNTER — Ambulatory Visit: Payer: Medicare PPO | Admitting: Family Medicine

## 2022-10-23 ENCOUNTER — Telehealth: Payer: Self-pay | Admitting: Family Medicine

## 2022-10-23 NOTE — Telephone Encounter (Signed)
Karen [name] called in stating that she is having  a hard time getting her medication   hydrOXYzine (ATARAX) 10 MG tablet from the pharmacy.She said that she called and they are giving  her the run around about it. She said that is shouldn't be too early for her to receive it,since she takes 2 a day.

## 2022-10-24 NOTE — Telephone Encounter (Signed)
Noted. Thanks for the message. We can discuss this at her appointment on Monday.

## 2022-10-24 NOTE — Telephone Encounter (Signed)
Patient called back, she wanted to make Dr. Reece Agar aware that she has not been abusing this medication. She states she was given 30 tablets at her last refill and is instructed to take 1-2 tablets per day, says that is why she has run out quickly. Patient wanted to make Dr. Reece Agar aware of this, advised I would pass along the message

## 2022-10-24 NOTE — Telephone Encounter (Signed)
Chart shows last rx was 10/01/22, #30/0 to [facility].  Spoke with pt informing her of above and that it is too early for refill since pt is only to take BID PRN, as Dr. Reece Agar and myself have explained to her previously. Pt then states she "doesn't really need it because she feels fine". I offered OV to discuss med with Dr. Reece Agar. Pt declined and states she'll call back if she changes her mind. Fyi to Dr. Reece Agar.

## 2022-10-28 ENCOUNTER — Ambulatory Visit: Payer: Medicare PPO | Admitting: Family Medicine

## 2022-10-28 ENCOUNTER — Encounter: Payer: Self-pay | Admitting: Family Medicine

## 2022-10-28 VITALS — BP 130/66 | HR 70 | Temp 97.7°F | Ht 62.0 in | Wt 143.0 lb

## 2022-10-28 DIAGNOSIS — G3184 Mild cognitive impairment, so stated: Secondary | ICD-10-CM | POA: Diagnosis not present

## 2022-10-28 DIAGNOSIS — E039 Hypothyroidism, unspecified: Secondary | ICD-10-CM

## 2022-10-28 DIAGNOSIS — F4323 Adjustment disorder with mixed anxiety and depressed mood: Secondary | ICD-10-CM

## 2022-10-28 DIAGNOSIS — Z86010 Personal history of colonic polyps: Secondary | ICD-10-CM

## 2022-10-28 DIAGNOSIS — Z1211 Encounter for screening for malignant neoplasm of colon: Secondary | ICD-10-CM

## 2022-10-28 MED ORDER — DONEPEZIL HCL 5 MG PO TABS
5.0000 mg | ORAL_TABLET | Freq: Every day | ORAL | 6 refills | Status: DC
Start: 2022-10-28 — End: 2023-01-02

## 2022-10-28 NOTE — Patient Instructions (Addendum)
Pass by lab to pick up stool kit.   Trial aricept (donepezil) 5mg  nightly.   Work on 4 core lifestyle modifications to support a healthy mind:  1. Nutritious well balance diet.  2. Regular physical activity routine.  3. Regular mental activity such as reading books, word puzzles, math puzzles, jigsaw puzzles.  4. Social engagement.  Also ensure good blood pressure control, limit alcohol, no smoking.    Call to schedule mammogram and bone density scan at your convenience: [facility] [phone]

## 2022-10-28 NOTE — Progress Notes (Unsigned)
Ph: [phone] Fax: [phone]   Patient ID: Karen [name], female    DOB: 03/22/1946, 77 y.o.   MRN: [id number]  This visit was conducted in person.  BP 130/66   Pulse 70   Temp 97.7 F (36.5 C) (Temporal)   Ht 5\' 2"  (1.575 m)   Wt 143 lb (64.9 kg)   LMP  (LMP Unknown)   SpO2 95%   BMI 26.16 kg/m    CC: 6 mo f/u visit  Subjective:   HPI: Karen [name] is a 77 y.o. female presenting on 10/28/2022 for Medical Management of Chronic Issues (Here for 6 mo f/u. Pt accompanied by son, Tawanna Cooler. )   See prior note for details.  Last visit diagnosed with mild cognitive impairment given remains independent in ADLs and IADLs. Vitamin B12 levels were low normal - rec start oral B12 daily, as well as continue vit D 1000 international units daily.   MMSE 25/30, CDT 4/4 (11/2021).  MMSE 22/30, CDT 4/4 (07/2022) - 0/3 recall.  Fhmx CAD. Older sister with dementia.   They've decided not to repeat colonoscopy - as she was unable to go all night without eating. Agree to iFOB.      Relevant past medical, surgical, family and social history reviewed and updated as indicated. Interim medical history since our last visit reviewed. Allergies and medications reviewed and updated. Outpatient Medications Prior to Visit  Medication Sig Dispense Refill   acetaminophen (TYLENOL) 500 MG tablet Take 1 tablet (500 mg total) by mouth every 6 (six) hours as needed.     calcium-vitamin D (OSCAL WITH D) 500-200 MG-UNIT tablet Take 1 tablet by mouth 3 (three) times daily. 90 tablet 6   Cholecalciferol (VITAMIN D3) 1.25 MG (50000 UT) TABS Take 1 tablet by mouth once a week. 12 tablet 3   cyanocobalamin (VITAMIN B12) 1000 MCG tablet Take 1 tablet (1,000 mcg total) by mouth daily.     Multiple Vitamin (MULTIVITAMIN WITH MINERALS) TABS tablet Take 1 tablet by mouth daily. Vita-Lea Women Multivitamn     psyllium (METAMUCIL) 58.6 % packet Take 1 packet by mouth daily as needed.     traZODone (DESYREL) 50 MG  tablet TAKE 1/2 TO 1 TABLET BY MOUTH AT BEDTIME AS NEEDED FOR SLEEP 30 tablet 2   vitamin E 180 MG (400 UNITS) capsule Take 400 Units by mouth daily.     hydrOXYzine (ATARAX) 10 MG tablet TAKE 1 TABLET (10 MG TOTAL) BY MOUTH TWICE A DAY AS NEEDED FOR ANXIETY (Patient not taking: Reported on 10/28/2022) 30 tablet 0   No facility-administered medications prior to visit.     Per HPI unless specifically indicated in ROS section below Review of Systems  Objective:  BP 130/66   Pulse 70   Temp 97.7 F (36.5 C) (Temporal)   Ht 5\' 2"  (1.575 m)   Wt 143 lb (64.9 kg)   LMP  (LMP Unknown)   SpO2 95%   BMI 26.16 kg/m   Wt Readings from Last 3 Encounters:  10/28/22 143 lb (64.9 kg)  08/08/22 143 lb 8 oz (65.1 kg)  04/29/22 144 lb 4 oz (65.4 kg)      Physical Exam Vitals and nursing note reviewed.  Constitutional:      Appearance: Normal appearance. She is not ill-appearing.  Neurological:     Mental Status: She is alert.  Psychiatric:        Mood and Affect: Mood normal.        Behavior:  Behavior normal.       Lab Results  Component Value Date   TSH 3.23 04/29/2022    Lab Results  Component Value Date   VITAMINB12 301 08/08/2022   Lab Results  Component Value Date   VD25OH 24 (L) 08/08/2022    Assessment & Plan:   Problem List Items Addressed This Visit   None    No orders of the defined types were placed in this encounter.   No orders of the defined types were placed in this encounter.   There are no Patient Instructions on file for this visit.  Follow up plan: No follow-ups on file.  Eustaquio Boyden, MD

## 2022-10-30 DIAGNOSIS — Z86010 Personal history of colonic polyps: Secondary | ICD-10-CM | POA: Insufficient documentation

## 2022-10-30 NOTE — Assessment & Plan Note (Addendum)
H/o TA during last colonoscopy [address]) - referred to [facility] and tried rpt colonoscopy a few years ago - however GI cancelled day of procedure as she was unable to go the whole night without eating.  They have decided to defer colonoscopy at this time. They do agree to checking iFOB  this year.  If normal, consider aging out.

## 2022-10-30 NOTE — Assessment & Plan Note (Addendum)
Discussed with pt and daughter concern memory concerns reviewed latest MMSE (22/30, with 0/3 recall). Anticipate MCI with memory impairment given she continues to function independently.  Reviewed 4 strategies to maintain a healthy mind as per instructions.  Stop hydroxyzine given anticholinergic effects.  Start aricept 5mg  nightly - reviewing need to monitor for GI symptom as well a possible altered cardiac conduction.  RTC 6 wk f/u visit.

## 2022-10-30 NOTE — Assessment & Plan Note (Signed)
Stable period off thyroid replacement with latest TSH normal range.

## 2022-10-30 NOTE — Assessment & Plan Note (Signed)
Off paxil since earlier in the year  Will stop hydroxyzine as per above.  Avoid benzos given memory concerns.

## 2022-10-31 ENCOUNTER — Other Ambulatory Visit: Payer: Self-pay | Admitting: Family Medicine

## 2022-10-31 DIAGNOSIS — F4323 Adjustment disorder with mixed anxiety and depressed mood: Secondary | ICD-10-CM

## 2022-10-31 NOTE — Telephone Encounter (Signed)
Pt confused about her medication. She called pharmacy and "they don't know anything"  She just wants to know if she is supposed to be taking anything because she doesn't have any medications right now.

## 2022-10-31 NOTE — Telephone Encounter (Signed)
Per 10/28/22 OV notes, pt will to stop hydroxyzine. She has 6 wk f/u OV on 12/09/22 and can discuss with Dr. Reece Agar then.  Lvm asking pt to call back. Need to relay info above.

## 2022-11-03 NOTE — Telephone Encounter (Signed)
Pt was started on donepezil 5mg  nightly. This should be at pharmacy for her.

## 2022-11-03 NOTE — Telephone Encounter (Addendum)
Spoke with pt's daughter, Charleen Kirks (on dpr), relaying Dr. Timoteo Expose message. She verbalizes understanding and states they have picked up rx. Says pt was confused but she will inform her.

## 2022-11-13 ENCOUNTER — Encounter (INDEPENDENT_AMBULATORY_CARE_PROVIDER_SITE_OTHER): Payer: Self-pay

## 2022-12-09 ENCOUNTER — Ambulatory Visit: Payer: Medicare PPO | Admitting: Family Medicine

## 2022-12-16 ENCOUNTER — Ambulatory Visit: Payer: Medicare PPO | Admitting: Family Medicine

## 2022-12-17 ENCOUNTER — Telehealth: Payer: Self-pay | Admitting: Family Medicine

## 2022-12-17 NOTE — Telephone Encounter (Signed)
Patient contacted the office, stated she is having issues refilling the medications she takes for anxiety and depression. Patient was not sure of names of medications, said that Dr. Reece Agar knows the names of the ones she currently takes. I have looked on meds list and believe that meds are hydroxyzine and lorazepam, patient believes that these may be the ones as well. Patient is asking if these meds she takes could be refilled for her to pickup at [facility], please advise patient if needed

## 2022-12-17 NOTE — Telephone Encounter (Signed)
Spoke with pt's daughter, Charleen Kirks (on dpr), relaying Dr Timoteo Expose message. She verbalizes understanding and states pt doesn't really need the medications so don't worry about it. Encouraged her to have pt keep 10/4 OV. States she will.

## 2022-12-17 NOTE — Telephone Encounter (Addendum)
Per 10/28/22 OV notes, hydroxyzine was stopped and lorazepam is not on current med list. Looks like pt had 6 wk MCI f/u OV on 9/10, then r/s to 9/17 and now r/s to 01/02/23.

## 2022-12-17 NOTE — Telephone Encounter (Signed)
Last visit we decided to stop both these medicines.  We can discuss this in detail at her next appt next month. She rescheduled appts twice this month.

## 2022-12-30 ENCOUNTER — Telehealth: Payer: Self-pay | Admitting: Family Medicine

## 2022-12-30 NOTE — Telephone Encounter (Signed)
Rtn pt's call and pt's daughter, Sharyn Creamer (on dpr), answered. States pt is ok and to disregard the call. Encouraged her to call back if they do need something. Donnelle expresses her thanks.

## 2022-12-30 NOTE — Telephone Encounter (Signed)
Pt called requesting a call back from [facility] to discuss her memory issues. Pt states she is not happy with her state of mind when it comes to her memory. Please advise. Call back # [phone]

## 2023-01-01 NOTE — Telephone Encounter (Signed)
We can discuss at OV tomorrow.

## 2023-01-01 NOTE — Telephone Encounter (Signed)
Pt called and said pts last visit Dr Reece Agar mentioned something about a memory issue in pts chart and pt said her daughter picked up on that and pt wants to make sure no more mention of memory issues unless major concerns. Pt said she is not mad at Dr Reece Agar but has to be careful of what is put in pts chart. Pt said "my memory is great, pt lives alone and pt has traveled to 28 different states on a motorcycle and walked 4 miles last week." Pt said again she is not mad but in the future wants no mention of memory problems unless major.sending note to Dr Reece Agar.

## 2023-01-02 ENCOUNTER — Telehealth: Payer: Self-pay | Admitting: Family Medicine

## 2023-01-02 ENCOUNTER — Encounter: Payer: Self-pay | Admitting: Family Medicine

## 2023-01-02 ENCOUNTER — Ambulatory Visit: Payer: Medicare PPO | Admitting: Family Medicine

## 2023-01-02 VITALS — BP 126/70 | HR 79 | Temp 98.1°F | Ht 62.0 in | Wt 135.5 lb

## 2023-01-02 DIAGNOSIS — M8588 Other specified disorders of bone density and structure, other site: Secondary | ICD-10-CM | POA: Diagnosis not present

## 2023-01-02 DIAGNOSIS — N1831 Chronic kidney disease, stage 3a: Secondary | ICD-10-CM

## 2023-01-02 DIAGNOSIS — G3184 Mild cognitive impairment, so stated: Secondary | ICD-10-CM | POA: Diagnosis not present

## 2023-01-02 DIAGNOSIS — E559 Vitamin D deficiency, unspecified: Secondary | ICD-10-CM

## 2023-01-02 DIAGNOSIS — E538 Deficiency of other specified B group vitamins: Secondary | ICD-10-CM | POA: Diagnosis not present

## 2023-01-02 MED ORDER — DONEPEZIL HCL 10 MG PO TABS: 10.0000 mg | ORAL_TABLET | Freq: Every day | ORAL | 3 refills | Status: DC

## 2023-01-02 NOTE — Assessment & Plan Note (Signed)
Overdue for rpt DEXA - # provided to call and schedule, as well as for mammogram which she's also overdue.

## 2023-01-02 NOTE — Patient Instructions (Addendum)
Call to schedule mammogram and bone density scan at your convenience: [facility] [phone].   Increase aricept to 10mg  daily.  Labs today.  Good to see you today.  Return in 4 months for physical/wellness visit

## 2023-01-02 NOTE — Assessment & Plan Note (Addendum)
Again reviewed with patient and daughter Sharyn Creamer.  She has tolerated aricept (donepezil) 5mg  well without cardiac arrhythmia, GERD or GI upset. Will increase dose to 10mg  daily, reassess at f/u visit /CPE in 4 months Update b12 levels

## 2023-01-02 NOTE — Assessment & Plan Note (Signed)
Update levels on 5000 international units daily.

## 2023-01-02 NOTE — Assessment & Plan Note (Signed)
Update renal function.  Continue to encourage good hydration.

## 2023-01-02 NOTE — Progress Notes (Signed)
Ph: [phone] Fax: [phone]   Patient ID: Karen [name], female    DOB: 1945-10-16, 77 y.o.   MRN: [id number]  This visit was conducted in person.  BP 126/70   Pulse 79   Temp 98.1 F (36.7 C) (Oral)   Ht 5\' 2"  (1.575 m)   Wt 135 lb 8 oz (61.5 kg)   LMP  (LMP Unknown)   SpO2 96%   BMI 24.78 kg/m    CC: 6 wk f/u visit  Subjective:   HPI: Karen [name] is a 77 y.o. female presenting on 01/02/2023 for Medical Management of Chronic Issues (Here for 6 wk MCI f/u. Pt accompanied by daughter, Charleen Kirks. )   See prior note for details.  MCI with amnesia - remains independent in ADLs and IADLs.  She is taking vitamin b12 1000 mcg  daily and vitamin D 1000 international units daily.   Last visit we started aricept 5mg  daily - she is tolerating this well.   MMSE 25/30, CDT 4/4 (11/2021).  MMSE 22/30, CDT 4/4 (07/2022) - 0/3 recall.  Fhmx CAD. Older sister with dementia.   Due for rpt colonoscopy - decided not to repeat given she was unable to do prep the night before. iFOB still pending.  Has not rescheduled mammogram or bone density scan.      Relevant past medical, surgical, family and social history reviewed and updated as indicated. Interim medical history since our last visit reviewed. Allergies and medications reviewed and updated. Outpatient Medications Prior to Visit  Medication Sig Dispense Refill   acetaminophen (TYLENOL) 500 MG tablet Take 1 tablet (500 mg total) by mouth every 6 (six) hours as needed.     calcium-vitamin D (OSCAL WITH D) 500-200 MG-UNIT tablet Take 1 tablet by mouth 3 (three) times daily. 90 tablet 6   Cholecalciferol (VITAMIN D3) 1.25 MG (50000 UT) TABS Take 1 tablet by mouth once a week. 12 tablet 3   cyanocobalamin (VITAMIN B12) 1000 MCG tablet Take 1 tablet (1,000 mcg total) by mouth daily.     Multiple Vitamin (MULTIVITAMIN WITH MINERALS) TABS tablet Take 1 tablet by mouth daily. Vita-Lea Women Multivitamn     psyllium (METAMUCIL) 58.6 %  packet Take 1 packet by mouth daily as needed.     vitamin E 180 MG (400 UNITS) capsule Take 400 Units by mouth daily.     donepezil (ARICEPT) 5 MG tablet Take 1 tablet (5 mg total) by mouth at bedtime. 30 tablet 6   No facility-administered medications prior to visit.     Per HPI unless specifically indicated in ROS section below Review of Systems  Objective:  BP 126/70   Pulse 79   Temp 98.1 F (36.7 C) (Oral)   Ht 5\' 2"  (1.575 m)   Wt 135 lb 8 oz (61.5 kg)   LMP  (LMP Unknown)   SpO2 96%   BMI 24.78 kg/m   Wt Readings from Last 3 Encounters:  01/02/23 135 lb 8 oz (61.5 kg)  10/28/22 143 lb (64.9 kg)  08/08/22 143 lb 8 oz (65.1 kg)      Physical Exam    Results for orders placed or performed in visit on 08/08/22  RPR  Result Value Ref Range   RPR Ser Ql REACTIVE (A) NON-REACTIVE  Vitamin B12  Result Value Ref Range   Vitamin B-12 301 200 - 1,100 pg/mL  VITAMIN D 25 Hydroxy (Vit-D Deficiency, Fractures)  Result Value Ref Range   Vit D, 25-Hydroxy 24 (L)  30 - 100 ng/mL  Rpr titer  Result Value Ref Range   RPR Titer 1:1 (H)   T PALLIDUM AB  Result Value Ref Range   T Pallidum Abs NEGATIVE NEGATIVE   Lab Results  Component Value Date   NA 143 04/29/2022   CL 107 04/29/2022   K 3.8 04/29/2022   CO2 27 04/29/2022   BUN 19 04/29/2022   CREATININE 1.22 (H) 04/29/2022   GFR 42.92 (L) 04/29/2022   CALCIUM 9.0 04/29/2022   PHOS 3.7 04/29/2022   ALBUMIN 4.1 04/29/2022   GLUCOSE 90 04/29/2022    Assessment & Plan:   Problem List Items Addressed This Visit     Osteopenia    Overdue for rpt DEXA - # provided to call and schedule, as well as for mammogram which she's also overdue.       CKD (chronic kidney disease) stage 3, GFR 30-59 ml/min (HCC)    Update renal function.  Continue to encourage good hydration.      Relevant Orders   Renal function panel   Vitamin D deficiency    Update levels on 5000 international units daily.       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  She has tolerated aricept (donepezil) 5mg  well without cardiac arrhythmia, GERD or GI upset. Will increase dose to 10mg  daily, reassess at f/u visit /CPE in 4 months Update b12 levels      Other Visit Diagnoses     Low serum vitamin B12       Relevant Orders   Vitamin B12        Meds ordered this encounter  Medications   donepezil (ARICEPT) 10 MG tablet    Sig: Take 1 tablet (10 mg total) by mouth at bedtime.    Dispense:  30 tablet    Refill:  3    Orders Placed This Encounter  Procedures   Vitamin B12   VITAMIN D 25 Hydroxy (Vit-D Deficiency, Fractures)   Renal function panel    Patient Instructions  Call to schedule mammogram and bone density scan at your convenience: [facility] [phone].   Increase aricept to 10mg  daily.  Labs today.  Good to see you today.  Return in 4 months for physical/wellness visit   Follow up plan: Return in about 4 months (around 05/05/2023), or if symptoms worsen or fail to improve, for annual exam, prior fasting for blood work, medicare wellness visit.  Eustaquio Boyden, MD

## 2023-01-02 NOTE — Telephone Encounter (Signed)
Pt's daughter, Charleen Kirks, requested for lab results be mailed to her home address? Donnelle stated pt doesn't use mychart & with the pt's memory issues, if someone calls with results, pt may forget. Donnelle's address is [address] in [place] [address]. Call back # [phone].

## 2023-01-03 LAB — RENAL FUNCTION PANEL
Albumin: 3.7 g/dL (ref 3.6–5.1)
BUN/Creatinine Ratio: 14 (calc) (ref 6–22)
BUN: 16 mg/dL (ref 7–25)
CO2: 25 mmol/L (ref 20–32)
Calcium: 9.2 mg/dL (ref 8.6–10.4)
Chloride: 106 mmol/L (ref 98–110)
Creat: 1.15 mg/dL — ABNORMAL HIGH (ref 0.60–1.00)
Glucose, Bld: 143 mg/dL — ABNORMAL HIGH (ref 65–99)
Phosphorus: 3.1 mg/dL (ref 2.1–4.3)
Potassium: 3.7 mmol/L (ref 3.5–5.3)
Sodium: 141 mmol/L (ref 135–146)

## 2023-01-03 LAB — VITAMIN D 25 HYDROXY (VIT D DEFICIENCY, FRACTURES): Vit D, 25-Hydroxy: 58 ng/mL (ref 30–100)

## 2023-01-03 LAB — VITAMIN B12: Vitamin B-12: 1197 pg/mL — ABNORMAL HIGH (ref 200–1100)

## 2023-01-05 ENCOUNTER — Other Ambulatory Visit: Payer: Self-pay | Admitting: Family Medicine

## 2023-01-05 MED ORDER — VITAMIN B-12 1000 MCG PO TABS: 1000.0000 ug | ORAL_TABLET | ORAL | Status: AC

## 2023-01-05 NOTE — Telephone Encounter (Addendum)
Noted. Pt's daughter, Charleen Kirks, is on dpr.

## 2023-01-19 ENCOUNTER — Telehealth: Payer: Self-pay | Admitting: Family Medicine

## 2023-01-19 ENCOUNTER — Telehealth: Payer: Self-pay

## 2023-01-19 MED ORDER — DONEPEZIL HCL 5 MG PO TABS: 5.0000 mg | ORAL_TABLET | Freq: Every day | ORAL | 11 refills | Status: DC

## 2023-01-19 MED ORDER — FLUOXETINE HCL 10 MG PO CAPS: 10.0000 mg | ORAL_CAPSULE | Freq: Every day | ORAL | 6 refills | Status: DC

## 2023-01-19 NOTE — Telephone Encounter (Signed)
See subsequent phone notes.

## 2023-01-19 NOTE — Telephone Encounter (Addendum)
I do not see levothyroxine on current med list and hx list was last in 2022 for levothyroxine. Sending note to Dr Reece Agar and [facility]. Spoke with Misty Stanley CMA.

## 2023-01-19 NOTE — Telephone Encounter (Signed)
Patients daughter called in to let Dr Reece Agar know that she thinks that medication donepezil (ARICEPT) 10 MG tablet has been causing her mom to be very jittery and emotional.She would like to know if the antidepressant that she was taking in the past ,about a year ago ,could be prescribed to her again.She couldn't remember the name of it ,however,she said that it helped her mom very good.

## 2023-01-19 NOTE — Telephone Encounter (Signed)
Plz call daughter - 10mg  aricept (donepezil) may be causing GI upset, diarrhea, would suggest we drop aricept dose back to 5mg  which she tolerated better. Lower dose sent to pharmacy.  She was previously on Paxil - would like to try Prozac which is similar medicine in same family and may be better tolerated. Start prozac 10mg  daily sent to pharmacy - this is antidepressant for mood and anxiety.

## 2023-01-19 NOTE — Telephone Encounter (Signed)
Pt called in and stated she can not take her thyroid medicineSubclinical hypothyroidism  any more it gives her  diarrhea and would like for the cma to give her a call and she doing good and don't think she needs to take it any more .

## 2023-01-20 NOTE — Telephone Encounter (Signed)
Called patient daughter(on dpr) reviewed all information and repeated back to me. Will call if any questions. She made changes as requested. Will call if any further symptoms.

## 2023-01-29 ENCOUNTER — Other Ambulatory Visit: Payer: Self-pay | Admitting: Family Medicine

## 2023-01-29 NOTE — Telephone Encounter (Signed)
Duly noted.  I updated her med and allergy list. Routed to PCP as FYI.  I would continue as is for now, based on this note.  Thanks.

## 2023-01-29 NOTE — Telephone Encounter (Signed)
Pt has called in x 3 this morning about only having one medication bottle.I spoke with Maureen Ralphs at [facility] and she said the aricept and fluoxetine were picked up on 01/19/23. Pt said she can only find one bottle labeled fluoxetine. I advised pt to look for a bottle donepezil 5 mg. Pt said she has searched everywhere and if she can't find it her kids will not find it. Pt said not to worry she is doing great and to have a good day. I called Donnelle(DPR signed) and she is aware pt is only taking fluoxetine. Donnelle sad that even with decrease in dosage of the donepezil to 5 mg pt was still extremely anxious and pt not eating well and losing weight. Donnelle said that she fixes pt medicines and Donnelle stopped the donepezil and pt is doing a lot better, not nearly as anxious and pt is eating a lot better. Sending FYI to Dr Reece Agar who is out of office and Dr Para March.

## 2023-02-02 NOTE — Telephone Encounter (Signed)
Noted. Ok to stay off aricept at this time.

## 2023-02-05 ENCOUNTER — Telehealth: Payer: Self-pay | Admitting: Family Medicine

## 2023-02-05 NOTE — Telephone Encounter (Signed)
Just an FYI patient has called in requesting refills on medication. She stated that she only has FLUoxetine (PROZAC) 10 MG capsule left and all her other medications are out. Patient stated she isn't sure which one she is out of due to the bottles been empty. Spoke with Misty Stanley and she informed me to send a message back and she will check. Thank you!

## 2023-02-05 NOTE — Telephone Encounter (Signed)
Spoke with pt's daughter, Charleen Kirks (on dpr), notifying her of message from pt. States she is monitoring pt's meds and pt is doing fine and doesn't need anything at this time. Says pt is doing a lot better.

## 2023-03-20 ENCOUNTER — Telehealth: Payer: Self-pay

## 2023-03-20 NOTE — Telephone Encounter (Signed)
Called daughter states that Mom was only having some memory issues. States that she just wanted to check someone had mentioned that UTI can cause memory issues. She states that it is not any increased symptoms from base line. She denies her having any dysuria, polyuria, urgency or odor. She denies any fever or abdominal pian. She feels symptoms have improved after call to office yesterday. She has declined to set up visit at this time.  I have reviewed all symptoms that if any over the weekend to get seen at urgent care or ED. She repeated back to me and will reach out if any questions.

## 2023-03-20 NOTE — Telephone Encounter (Signed)
Copied from CRM [id number]. Topic: Clinical - Medical Advice >> Mar 19, 2023  4:43 PM Danika B wrote: Reason for CRM: Patients daughter Charleen Kirks called in to ask if Mom can be sent in a rx for UTI. States that mother is having bad memory and according to [facility] this is associated with a UTI. States that she doesn't want her mom to have to come in. When asked if patient was available to triage symptoms, she stated she is not presently with her. Wants to discuss with clinic. Callback [phone]

## 2023-03-27 ENCOUNTER — Ambulatory Visit: Payer: Medicare PPO | Admitting: Family

## 2023-05-08 ENCOUNTER — Telehealth: Payer: Self-pay

## 2023-05-08 ENCOUNTER — Ambulatory Visit: Payer: Medicare PPO | Admitting: Family Medicine

## 2023-05-08 ENCOUNTER — Telehealth: Payer: Self-pay | Admitting: Family Medicine

## 2023-05-08 ENCOUNTER — Encounter: Payer: Self-pay | Admitting: Family Medicine

## 2023-05-08 VITALS — BP 122/68 | HR 75 | Temp 97.8°F | Ht 62.0 in | Wt 136.4 lb

## 2023-05-08 DIAGNOSIS — G3184 Mild cognitive impairment, so stated: Secondary | ICD-10-CM | POA: Diagnosis not present

## 2023-05-08 DIAGNOSIS — F4323 Adjustment disorder with mixed anxiety and depressed mood: Secondary | ICD-10-CM

## 2023-05-08 NOTE — Telephone Encounter (Signed)
 Sending to [facility] admin

## 2023-05-08 NOTE — Telephone Encounter (Signed)
 Noted.

## 2023-05-08 NOTE — Telephone Encounter (Signed)
 Karen [name]

## 2023-05-08 NOTE — Assessment & Plan Note (Signed)
 Stable period on low dose prozac  10mg  daily - continue.

## 2023-05-08 NOTE — Progress Notes (Signed)
 Ph: [phone] Fax: [phone]   Patient ID: Karen [name], female    DOB: 02-Jun-1945, 78 y.o.   MRN: [id number]  This visit was conducted in person.  BP 122/68   Pulse 75   Temp 97.8 F (36.6 C) (Oral)   Ht 5' 2 (1.575 m)   Wt 136 lb 6 oz (61.9 kg)   LMP  (LMP Unknown)   SpO2 95%   BMI 24.94 kg/m    CC: 4 mo f/u visit  Subjective:   HPI: Karen [name] is a 78 y.o. female presenting on 05/08/2023 for Medical Management of Chronic Issues (Here for 4 mo f/u.)   Cancelled AMW scheduled for today.   Here alone today. Daughter did not accompany her but did send message that patient is doing well with vitamin B12 and D3 replacement and with recently restarted prozac  10mg  daily.   Longstanding MCI with amnesia - did not tolerate donepezil  5mg  due to increased anxiety.   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 Stays physically active Overall eats healthy - regularly takes Shakley multivitamins Discussed importance of memory activity. She stays activce at church and family. She goes to the theater and on activities with her church. She continues singing in choir every Sunday.  She stays active with her family.   Due for rpt colonoscopy - has declined completing. Has not completed iFOB either. Has not rescheduled mammogram or bone density scan.      Relevant past medical, surgical, family and social history reviewed and updated as indicated. Interim medical history since our last visit reviewed. Allergies and medications reviewed and updated. Outpatient Medications Prior to Visit  Medication Sig Dispense Refill   acetaminophen  (TYLENOL ) 500 MG tablet Take 1 tablet (500 mg total) by mouth every 6 (six) hours as needed.     calcium -vitamin D  (OSCAL WITH D) 500-200 MG-UNIT tablet Take 1 tablet by mouth 3 (three) times daily. 90 tablet 6   Cholecalciferol (VITAMIN D3) 1.25 MG (50000 UT) TABS Take 1 tablet by mouth once a week. 12 tablet 3    cyanocobalamin  (VITAMIN B12) 1000 MCG tablet Take 1 tablet (1,000 mcg total) by mouth every Monday, Wednesday, and Friday.     FLUoxetine  (PROZAC ) 10 MG capsule Take 1 capsule (10 mg total) by mouth daily. 30 capsule 6   Multiple Vitamin (MULTIVITAMIN WITH MINERALS) TABS tablet Take 1 tablet by mouth daily. Vita-Lea Women Multivitamn     psyllium (METAMUCIL) 58.6 % packet Take 1 packet by mouth daily as needed.     vitamin E  180 MG (400 UNITS) capsule Take 400 Units by mouth daily.     No facility-administered medications prior to visit.     Per HPI unless specifically indicated in ROS section below Review of Systems  Objective:  BP 122/68   Pulse 75   Temp 97.8 F (36.6 C) (Oral)   Ht 5' 2 (1.575 m)   Wt 136 lb 6 oz (61.9 kg)   LMP  (LMP Unknown)   SpO2 95%   BMI 24.94 kg/m   Wt Readings from Last 3 Encounters:  05/08/23 136 lb 6 oz (61.9 kg)  01/02/23 135 lb 8 oz (61.5 kg)  10/28/22 143 lb (64.9 kg)      Physical Exam Vitals and nursing note reviewed.  Constitutional:      Appearance: Normal appearance. She is not ill-appearing.  HENT:     Mouth/Throat:     Mouth: Mucous membranes  are moist.     Pharynx: Oropharynx is clear. No oropharyngeal exudate or posterior oropharyngeal erythema.  Eyes:     Extraocular Movements: Extraocular movements intact.     Conjunctiva/sclera: Conjunctivae normal.     Pupils: Pupils are equal, round, and reactive to light.  Cardiovascular:     Rate and Rhythm: Normal rate and regular rhythm.     Pulses: Normal pulses.     Heart sounds: Normal heart sounds. No murmur heard. Pulmonary:     Effort: Pulmonary effort is normal. No respiratory distress.     Breath sounds: Normal breath sounds. No wheezing, rhonchi or rales.  Musculoskeletal:     Right lower leg: No edema.     Left lower leg: No edema.  Skin:    General: Skin is warm and dry.  Neurological:     Mental Status: She is alert.  Psychiatric:        Mood and Affect: Mood  normal.        Behavior: Behavior normal.       Results for orders placed or performed in visit on 01/02/23  Vitamin B12   Collection Time: 01/02/23  3:24 PM  Result Value Ref Range   Vitamin B-12 1,197 (H) 200 - 1,100 pg/mL  VITAMIN D  25 Hydroxy (Vit-D Deficiency, Fractures)   Collection Time: 01/02/23  3:24 PM  Result Value Ref Range   Vit D, 25-Hydroxy 58 30 - 100 ng/mL  Renal function panel   Collection Time: 01/02/23  3:24 PM  Result Value Ref Range   Glucose, Bld 143 (H) 65 - 99 mg/dL   BUN 16 7 - 25 mg/dL   Creat 8.84 (H) 9.39 - 1.00 mg/dL   BUN/Creatinine Ratio 14 6 - 22 (calc)   Sodium 141 135 - 146 mmol/L   Potassium 3.7 3.5 - 5.3 mmol/L   Chloride 106 98 - 110 mmol/L   CO2 25 20 - 32 mmol/L   Calcium  9.2 8.6 - 10.4 mg/dL   Phosphorus 3.1 2.1 - 4.3 mg/dL   Albumin 3.7 3.6 - 5.1 g/dL    Assessment & Plan:   Problem List Items Addressed This Visit     Adjustment disorder with mixed anxiety and depressed mood   Stable period on low dose prozac  10mg  daily - continue.       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  She denies having a memory trouble.  Donepezil  intolerance. Consider namenda.  Continue prozac  10mg  daily.  She seems to do well with physical activity, social engagement, and healthy diet. Encouraged renewed efforts towards mental activities ie word puzzles, reading, crosswords, etc.  Could consider brain imaging, neurology referral.         No orders of the defined types were placed in this encounter.   No orders of the defined types were placed in this encounter.   Patient Instructions  Bring in vitamins and medicines to next visit.  No labs today  Return in 3-4 months for wellness visit/physical, prior 1 week for fasting labs.   Call to schedule mammogram at your convenience:  [facility] [phone]  Follow up plan: Return in about 4 months (around  09/05/2023) for annual exam, prior fasting for blood work, medicare wellness visit.  Anton Blas, MD

## 2023-05-08 NOTE — Telephone Encounter (Signed)
 Copied from CRM [id number]. Topic: General - Other >> May 08, 2023  1:57 PM Russell PARAS wrote: Reason for CRM: Patient's daughter called in to let provider know her mother is doing well on the antidepressant and is taking vitamin B3, vitamin D  and B12 as prescribed. She would like the message passed to provider to give him an update since she is not able to make the appt with her mother today.

## 2023-05-08 NOTE — Patient Instructions (Addendum)
 Bring in vitamins and medicines to next visit.  No labs today  Return in 3-4 months for wellness visit/physical, prior 1 week for fasting labs.   Call to schedule mammogram at your convenience:  [facility] [phone]

## 2023-05-08 NOTE — Telephone Encounter (Signed)
 Spoke to pt's daughter, Alvia Jointer, pt's daughter stated she wanted to let Dr. Crissie Dome know that she won't be attending the pt's appt today at 3pm. Donnelle also stated the pt's antidepressant meds have been working great. Call back # [phone]

## 2023-05-08 NOTE — Assessment & Plan Note (Signed)
 Again discussed with patient.  She denies having a memory trouble.  Donepezil  intolerance. Consider namenda.  Continue prozac  10mg  daily.  She seems to do well with physical activity, social engagement, and healthy diet. Encouraged renewed efforts towards mental activities ie word puzzles, reading, crosswords, etc.  Could consider brain imaging, neurology referral.

## 2023-06-15 ENCOUNTER — Telehealth: Payer: Self-pay

## 2023-06-16 ENCOUNTER — Telehealth: Payer: Self-pay

## 2023-06-16 NOTE — Telephone Encounter (Signed)
 Please call - what medicine is she referring to?

## 2023-06-16 NOTE — Telephone Encounter (Signed)
 Copied from CRM [id number]. Topic: Clinical - Medication Question >> Jun 16, 2023  8:24 AM Elizebeth Brooking wrote: Reason for CRM: Patient called in stating she is unable to get her medication the pharmacy wouldn't let her as she try to get them refilled. She has been without them for a couple days now. Did schedule a appointment to be seen by Dr.G but would like to know what to do until then without her medication

## 2023-06-17 MED ORDER — FLUOXETINE HCL 10 MG PO CAPS: 10.0000 mg | ORAL_CAPSULE | Freq: Every day | ORAL | 6 refills | Status: DC

## 2023-06-17 NOTE — Telephone Encounter (Signed)
 Called spoke to daughter on dpr. States that no refills are needed at this time. Will need refill of prozac in about 20 days. Advised daughter that about a week out from needing refill to contact our office. She expressed issues getting on patient my chart. Gave her contact number for IT assistance for my chart. Patient requested that we cancel next follow up with patient on 3/31 no longer needed. Verified lab and CPE appointment dates and time with daughter.

## 2023-06-17 NOTE — Telephone Encounter (Signed)
 See 06/16/23 phn note.

## 2023-06-17 NOTE — Addendum Note (Signed)
 Addended by: Eustaquio Boyden on: 06/17/2023 11:23 AM   Modules accepted: Orders

## 2023-06-17 NOTE — Telephone Encounter (Signed)
 I went ahead and refilled prozac for her

## 2023-06-29 ENCOUNTER — Ambulatory Visit: Admitting: Family Medicine

## 2023-07-16 ENCOUNTER — Ambulatory Visit

## 2023-07-26 ENCOUNTER — Other Ambulatory Visit: Payer: Self-pay | Admitting: Family Medicine

## 2023-07-26 DIAGNOSIS — M8588 Other specified disorders of bone density and structure, other site: Secondary | ICD-10-CM

## 2023-07-26 DIAGNOSIS — E7800 Pure hypercholesterolemia, unspecified: Secondary | ICD-10-CM

## 2023-07-26 DIAGNOSIS — N1831 Chronic kidney disease, stage 3a: Secondary | ICD-10-CM

## 2023-07-26 DIAGNOSIS — E038 Other specified hypothyroidism: Secondary | ICD-10-CM

## 2023-07-26 DIAGNOSIS — G3184 Mild cognitive impairment, so stated: Secondary | ICD-10-CM

## 2023-07-26 DIAGNOSIS — E559 Vitamin D deficiency, unspecified: Secondary | ICD-10-CM

## 2023-07-29 ENCOUNTER — Other Ambulatory Visit: Payer: Medicare PPO

## 2023-08-05 ENCOUNTER — Encounter: Payer: Self-pay | Admitting: Family Medicine

## 2023-08-05 ENCOUNTER — Ambulatory Visit (INDEPENDENT_AMBULATORY_CARE_PROVIDER_SITE_OTHER): Payer: Medicare PPO | Admitting: Family Medicine

## 2023-08-05 ENCOUNTER — Telehealth: Payer: Self-pay

## 2023-08-05 VITALS — BP 122/68 | HR 53 | Temp 98.0°F | Ht 62.0 in | Wt 135.4 lb

## 2023-08-05 DIAGNOSIS — N1831 Chronic kidney disease, stage 3a: Secondary | ICD-10-CM

## 2023-08-05 DIAGNOSIS — M8588 Other specified disorders of bone density and structure, other site: Secondary | ICD-10-CM | POA: Diagnosis not present

## 2023-08-05 DIAGNOSIS — Z0000 Encounter for general adult medical examination without abnormal findings: Secondary | ICD-10-CM

## 2023-08-05 DIAGNOSIS — E038 Other specified hypothyroidism: Secondary | ICD-10-CM | POA: Diagnosis not present

## 2023-08-05 DIAGNOSIS — F4323 Adjustment disorder with mixed anxiety and depressed mood: Secondary | ICD-10-CM

## 2023-08-05 DIAGNOSIS — Z860100 Personal history of colon polyps, unspecified: Secondary | ICD-10-CM

## 2023-08-05 DIAGNOSIS — E7800 Pure hypercholesterolemia, unspecified: Secondary | ICD-10-CM

## 2023-08-05 DIAGNOSIS — E559 Vitamin D deficiency, unspecified: Secondary | ICD-10-CM

## 2023-08-05 DIAGNOSIS — K5909 Other constipation: Secondary | ICD-10-CM

## 2023-08-05 DIAGNOSIS — G3184 Mild cognitive impairment, so stated: Secondary | ICD-10-CM | POA: Diagnosis not present

## 2023-08-05 DIAGNOSIS — Z7189 Other specified counseling: Secondary | ICD-10-CM

## 2023-08-05 LAB — COMPREHENSIVE METABOLIC PANEL WITH GFR
ALT: 14 U/L (ref 0–35)
AST: 15 U/L (ref 0–37)
Albumin: 4.1 g/dL (ref 3.5–5.2)
Alkaline Phosphatase: 81 U/L (ref 39–117)
BUN: 12 mg/dL (ref 6–23)
CO2: 28 meq/L (ref 19–32)
Calcium: 9 mg/dL (ref 8.4–10.5)
Chloride: 107 meq/L (ref 96–112)
Creatinine, Ser: 1.11 mg/dL (ref 0.40–1.20)
GFR: 47.64 mL/min — ABNORMAL LOW (ref >60.00)
Glucose, Bld: 95 mg/dL (ref 70–99)
Potassium: 3.6 meq/L (ref 3.5–5.1)
Sodium: 142 meq/L (ref 135–145)
Total Bilirubin: 0.6 mg/dL (ref 0.2–1.2)
Total Protein: 6.1 g/dL (ref 6.0–8.3)

## 2023-08-05 LAB — CBC WITH DIFFERENTIAL/PLATELET
Basophils Absolute: 0.1 10*3/uL (ref 0.0–0.1)
Basophils Relative: 1.3 % (ref 0.0–3.0)
Eosinophils Absolute: 0.2 10*3/uL (ref 0.0–0.7)
Eosinophils Relative: 2 % (ref 0.0–5.0)
HCT: 42.6 % (ref 36.0–46.0)
Hemoglobin: 14.1 g/dL (ref 12.0–15.0)
Lymphocytes Relative: 22.1 % (ref 12.0–46.0)
Lymphs Abs: 1.6 10*3/uL (ref 0.7–4.0)
MCHC: 33.2 g/dL (ref 30.0–36.0)
MCV: 89.2 fl (ref 78.0–100.0)
Monocytes Absolute: 0.5 10*3/uL (ref 0.1–1.0)
Monocytes Relative: 6.9 % (ref 3.0–12.0)
Neutro Abs: 5 10*3/uL (ref 1.4–7.7)
Neutrophils Relative %: 67.7 % (ref 43.0–77.0)
Platelets: 183 10*3/uL (ref 150.0–400.0)
RBC: 4.78 Mil/uL (ref 3.87–5.11)
RDW: 13 % (ref 11.5–15.5)
WBC: 7.4 10*3/uL (ref 4.0–10.5)

## 2023-08-05 LAB — LIPID PANEL
Cholesterol: 189 mg/dL (ref 0–200)
HDL: 50.1 mg/dL (ref >39.00)
LDL Cholesterol: 114 mg/dL — ABNORMAL HIGH (ref 0–99)
NonHDL: 138.66
Total CHOL/HDL Ratio: 4
Triglycerides: 122 mg/dL (ref 0.0–149.0)
VLDL: 24.4 mg/dL (ref 0.0–40.0)

## 2023-08-05 LAB — TSH: TSH: 3.26 u[IU]/mL (ref 0.35–5.50)

## 2023-08-05 LAB — VITAMIN D 25 HYDROXY (VIT D DEFICIENCY, FRACTURES): VITD: 39.76 ng/mL (ref 30.00–100.00)

## 2023-08-05 LAB — PHOSPHORUS: Phosphorus: 3.7 mg/dL (ref 2.3–4.6)

## 2023-08-05 LAB — VITAMIN B12: Vitamin B-12: 431 pg/mL (ref 211–911)

## 2023-08-05 LAB — MICROALBUMIN / CREATININE URINE RATIO
Creatinine,U: 155.7 mg/dL
Microalb Creat Ratio: 8.7 mg/g (ref 0.0–30.0)
Microalb, Ur: 1.3 mg/dL (ref 0.0–1.9)

## 2023-08-05 LAB — T4, FREE: Free T4: 1.63 ng/dL — ABNORMAL HIGH (ref 0.60–1.60)

## 2023-08-05 MED ORDER — VITAMIN D (ERGOCALCIFEROL) 1.25 MG (50000 UNIT) PO CAPS
50000.0000 [IU] | ORAL_CAPSULE | ORAL | 3 refills | Status: AC
Start: 2023-08-05 — End: ?

## 2023-08-05 MED ORDER — FLUOXETINE HCL 10 MG PO CAPS: 10.0000 mg | ORAL_CAPSULE | Freq: Every day | ORAL | 3 refills | Status: AC

## 2023-08-05 NOTE — Assessment & Plan Note (Signed)
 Discussed - she desires to discontinue colon cancer screening at this time.

## 2023-08-05 NOTE — Assessment & Plan Note (Signed)
 Continue vit D 50,000 units weekly.

## 2023-08-05 NOTE — Telephone Encounter (Signed)
 Spoke with pt's daughter, Alvia Jointer (on dpr), notifying her we received a call from pt asking if she needs to keep today's OV. Informed her today's visit is pt's CPE, so she does need to keep appt. Donnelle verbalizes understanding and had explained that to pt. States pt's sister, Stana Ear, will be coming with pt.

## 2023-08-05 NOTE — Progress Notes (Addendum)
 Ph: [phone] Fax: [phone]   Patient ID: Karen [name], female    DOB: [birth year], 78 y.o.   MRN: [id number]  This visit was conducted in person.  BP 122/68   Pulse (!) 53   Temp 98 F (36.7 C) (Oral)   Ht 5\' 2"  (1.575 m)   Wt 135 lb 6 oz (61.4 kg)   LMP  (LMP Unknown)   SpO2 94%   BMI 24.76 kg/m    CC: CPE Subjective:   HPI: Karen [name] is a 78 y.o. female presenting on 08/05/2023 for Annual Exam (AWV scheduled for 09/10/23. Pt accompanied by sister, Stana Ear. )   Did not see health advisor this year.   No results found.  Flowsheet Row Office Visit from 08/05/2023 in [facility] [place]  PHQ-2 Total Score 0          08/05/2023   12:00 PM 04/29/2022    9:50 AM 04/16/2021    2:04 PM 03/28/2020    2:12 PM 03/16/2019   12:42 PM  Fall Risk   Falls in the past year? 0 1 1 1 1   Comment    fell off table   Number falls in past yr:  0 0 0 0  Injury with Fall?  0 1 1 1   Comment    broke her shoulder   Risk for fall due to :   Other (Comment) History of fall(s)   Risk for fall due to: Comment   fell off table    Follow up   Falls prevention discussed Falls evaluation completed;Falls prevention discussed    Hospitalized 11/2018 with urosepsis (largely asymptomatic) with SVT and elevated cardiac enzymes due to demand inschemia. Abnormal EKG showing LAD, poor R wave progression, LAFB. Echocardiogram overall reassuring (impaired LV relaxation).    Humeral fracture s/p ORIF 01/2020 (Dr Bulah Caro).   She is off paroxetine  and levothyroxine . She continues hydroxyzine  PRN.  Previous MMSE 25/30, CDT 4/4.  Fhmx CAD. Older sister with dementia.    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    Preventative: COLONOSCOPY 12/2014 - TA, diverticulosis, ext and int hemorrhoids, rpt 5 yrs ([name]) --> now established with Dr Twanna Galas GI, he recommended rpt colonoscopy 7-10 yrs. Due for rpt - pt states she does not want to repeat  colonoscopy.  Mammogram 05/2018 birads1 -due for repeat, # provided  Well woman - s/p hysterectomy for benign reasons. Ovaries removed as well.  DEXA Date: 11/2012 T score -2.4 AP spine. Due for rpt, # provided Lung cancer screening - not eligible  Flu shot yearly  COVID vaccine Pfizer 05/2019, 05/2019, declines booster  Pneumovax 2014, prevnar-13 2015  Tdap 07/2016  Zostavax 2012  Shingrix - unsure if she's received - asked to check with pharmacy Advanced directives: thinks she has one at home. Asked to bring me copy. Doesn't want prolonged life support if terminal condition "don't want to be kept alive", ok for reversible condition. HCPOA is Ena Harries son then Abe Abed daughter but this may not have been updated.  Seat belt use discussed.  Sunscreen use discussed. No changing moles on skin.  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 Interim medical history since our last visit reviewed. Allergies and medications reviewed and updated. Outpatient Medications Prior to Visit  Medication Sig Dispense Refill   acetaminophen  (TYLENOL ) 500 MG tablet Take 1 tablet (500 mg total) by mouth every 6 (six) hours as needed.     calcium -vitamin D  (OSCAL WITH D) 500-200 MG-UNIT tablet Take 1 tablet by mouth 3 (three) times daily. 90 tablet 6   cyanocobalamin  (VITAMIN B12) 1000 MCG tablet Take 1 tablet (1,000 mcg total) by mouth every Monday, Wednesday, and Friday.     Multiple Vitamin (MULTIVITAMIN WITH MINERALS) TABS tablet Take 1 tablet by mouth daily. V[employer]  Women Multivitamn     psyllium (METAMUCIL) 58.6 % packet Take 1 packet by mouth daily as needed.     vitamin E  180 MG (400 UNITS) capsule Take 400 Units by mouth daily.     Cholecalciferol (VITAMIN D3) 1.25 MG (50000 UT) TABS Take 1 tablet by mouth once a week. 12 tablet 3   FLUoxetine  (PROZAC ) 10 MG capsule Take 1 capsule (10 mg total) by mouth daily. 30 capsule 6   No facility-administered medications prior to visit.     Per HPI unless specifically indicated in ROS section below Review of Systems  Constitutional:  Negative for activity change, appetite change, chills, fatigue, fever and unexpected weight change.  HENT:  Negative for hearing loss.   Eyes:  Negative for visual disturbance.  Respiratory:  Negative for cough, chest tightness, shortness of breath and wheezing.   Cardiovascular:  Negative for chest pain, palpitations and leg swelling.  Gastrointestinal:  Negative for abdominal distention, abdominal pain, blood in stool, constipation, diarrhea, nausea and vomiting.  Genitourinary:  Negative for difficulty urinating and hematuria.  Musculoskeletal:  Negative for arthralgias, myalgias and neck pain.  Skin:  Negative for rash.  Neurological:  Negative for dizziness, seizures, syncope and headaches.  Hematological:  Negative for adenopathy. Does not bruise/bleed easily.  Psychiatric/Behavioral:  Negative for dysphoric mood. The patient is not nervous/anxious.     Objective:  BP 122/68   Pulse (!) 53   Temp 98 F (36.7 C) (Oral)   Ht 5\' 2"  (1.575 m)   Wt 135 lb 6 oz (61.4 kg)   LMP  (LMP Unknown)   SpO2 94%   BMI 24.76 kg/m   Wt Readings from Last 3 Encounters:  08/05/23 135 lb 6 oz (61.4 kg)  05/08/23 136 lb 6 oz (61.9 kg)  01/02/23 135 lb 8 oz (61.5 kg)      Physical Exam Vitals and nursing note reviewed.  Constitutional:      Appearance: Normal appearance. She is not ill-appearing.  HENT:     Head: Normocephalic and atraumatic.     Right Ear: Tympanic  membrane, ear canal and external ear normal. There is no impacted cerumen.     Left Ear: Tympanic membrane, ear canal and external ear normal. There is no impacted cerumen.     Mouth/Throat:     Mouth: Mucous membranes are moist.     Pharynx: Oropharynx is clear. No oropharyngeal exudate or posterior oropharyngeal erythema.  Eyes:     General:        Right eye: No discharge.        Left eye: No discharge.     Extraocular Movements: Extraocular movements intact.  Conjunctiva/sclera: Conjunctivae normal.     Pupils: Pupils are equal, round, and reactive to light.  Neck:     Thyroid : No thyroid  mass or thyromegaly.  Cardiovascular:     Rate and Rhythm: Normal rate and regular rhythm.     Pulses: Normal pulses.     Heart sounds: Normal heart sounds. No murmur heard. Pulmonary:     Effort: Pulmonary effort is normal. No respiratory distress.     Breath sounds: Normal breath sounds. No wheezing, rhonchi or rales.  Abdominal:     General: Bowel sounds are normal. There is no distension.     Palpations: Abdomen is soft. There is no mass.     Tenderness: There is no abdominal tenderness. There is no guarding or rebound.     Hernia: No hernia is present.  Musculoskeletal:     Cervical back: Normal range of motion and neck supple. No rigidity.     Right lower leg: No edema.     Left lower leg: No edema.  Lymphadenopathy:     Cervical: No cervical adenopathy.  Skin:    General: Skin is warm and dry.     Findings: No rash.  Neurological:     General: No focal deficit present.     Mental Status: She is alert. Mental status is at baseline.  Psychiatric:        Mood and Affect: Mood normal.        Behavior: Behavior normal.       Results for orders placed or performed in visit on 01/02/23  Vitamin B12   Collection Time: 01/02/23  3:24 PM  Result Value Ref Range   Vitamin B-12 1,197 (H) 200 - 1,100 pg/mL  VITAMIN D  25 Hydroxy (Vit-D Deficiency, Fractures)   Collection Time:  01/02/23  3:24 PM  Result Value Ref Range   Vit D, 25-Hydroxy 58 30 - 100 ng/mL  Renal function panel   Collection Time: 01/02/23  3:24 PM  Result Value Ref Range   Glucose, Bld 143 (H) 65 - 99 mg/dL   BUN 16 7 - 25 mg/dL   Creat 1.61 (H) 0.96 - 1.00 mg/dL   BUN/Creatinine Ratio 14 6 - 22 (calc)   Sodium 141 135 - 146 mmol/L   Potassium 3.7 3.5 - 5.3 mmol/L   Chloride 106 98 - 110 mmol/L   CO2 25 20 - 32 mmol/L   Calcium  9.2 8.6 - 10.4 mg/dL   Phosphorus 3.1 2.1 - 4.3 mg/dL   Albumin 3.7 3.6 - 5.1 g/dL   Lab Results  Component Value Date   TSH 3.23 04/29/2022    Assessment & Plan:   Problem List Items Addressed This Visit     Advanced care planning/counseling discussion (Chronic)   Previously discussed: Advanced directives: thinks she has one at home. Asked to bring me copy. Doesn't want prolonged life support if terminal condition "don't want to be kept alive", ok for reversible condition. HCPOA is Ena Harries son then Abe Abed daughter but this may not have been updated.  Sister Wenona Hamilton was added to [facility] form today.       Health maintenance examination - Primary (Chronic)   Preventative protocols reviewed and updated unless pt declined. Discussed healthy diet and lifestyle.       Adjustment disorder with mixed anxiety and depressed mood   Stable period on low dose prozac .       Osteopenia   Overdue for DEXA. Discussed with pt and sister - new order placed.  She does continue regularly supplementing with calcium  and vitamin D  as well as regular weight bearing exercise.       Relevant Orders   DG Bone Density   CKD (chronic kidney disease) stage 3, GFR 30-59 ml/min (HCC)   Chronic, stable period. Update renal panel. Continue optimizing BP control.       HLD (hyperlipidemia)   Chronic, stable off medication - update FLP as fasting. She has previously declined medication.  The 10-year ASCVD risk score (Arnett DK, et al., 2019) is: 20%   Values used to calculate  the score:     Age: 40 years     Sex: Female     Is Non-Hispanic African American: No     Diabetic: No     Tobacco smoker: No     Systolic Blood Pressure: 122 mmHg     Is BP treated: No     HDL Cholesterol: 54.1 mg/dL     Total Cholesterol: 191 mg/dL       Subclinical hypothyroidism   Stable period off thyroid  replacement. Previous trial of thyroid  replacement caused GI issues (loose stools)      Chronic constipation   Chronic issue well managed on PRN metamucil.       Vitamin D  deficiency   Continue vit D 50,000 units weekly.       Amnestic MCI (mild cognitive impairment with memory loss)   Progressive deterioration in MMSE over the years, latest 22/30 (07/2022) Intolerant of aricept . Defer namenda as it may increase agitation.       History of colon polyps   Discussed - she desires to discontinue colon cancer screening at this time.         Meds ordered this encounter  Medications   FLUoxetine  (PROZAC ) 10 MG capsule    Sig: Take 1 capsule (10 mg total) by mouth daily.    Dispense:  90 capsule    Refill:  3   Vitamin D , Ergocalciferol , (DRISDOL) 1.25 MG (50000 UNIT) CAPS capsule    Sig: Take 1 capsule (50,000 Units total) by mouth every 7 (seven) days.    Dispense:  12 capsule    Refill:  3    Orders Placed This Encounter  Procedures   DG Bone Density    Standing Status:   Future    Expiration Date:   08/04/2024    Reason for Exam (SYMPTOM  OR DIAGNOSIS REQUIRED):   osteopenia    [facility]    Patient Instructions  Labs today.  Urine test today.  Continue prozac  10mg  daily - sent to pharmacy.  Let us  know if you have bowel changes or blood in stool.  Call to schedule mammogram and bone density scan: [facility] [phone]  If interested, check with pharmacy about new 2 shot shingles series (shingrix).  Return in 6 months for follow up visit   Follow up plan: Return in about 6 months (around  02/05/2024), or if symptoms worsen or fail to improve, for follow up visit.  Claire Crick, MD

## 2023-08-05 NOTE — Assessment & Plan Note (Addendum)
 Chronic, stable period. Update renal panel. Continue optimizing BP control.

## 2023-08-05 NOTE — Assessment & Plan Note (Signed)
 Preventative protocols reviewed and updated unless pt declined. Discussed healthy diet and lifestyle.

## 2023-08-05 NOTE — Assessment & Plan Note (Signed)
 Overdue for DEXA. Discussed with pt and sister - new order placed.  She does continue regularly supplementing with calcium  and vitamin D  as well as regular weight bearing exercise.

## 2023-08-05 NOTE — Assessment & Plan Note (Signed)
 Progressive deterioration in MMSE over the years, latest 22/30 (07/2022) Intolerant of aricept . Defer namenda as it may increase agitation.

## 2023-08-05 NOTE — Patient Instructions (Addendum)
 Labs today.  Urine test today.  Continue prozac  10mg  daily - sent to pharmacy.  Let us  know if you have bowel changes or blood in stool.  Call to schedule mammogram and bone density scan: [facility] [phone]  If interested, check with pharmacy about new 2 shot shingles series (shingrix).  Return in 6 months for follow up visit

## 2023-08-05 NOTE — Assessment & Plan Note (Signed)
 Stable period off thyroid  replacement. Previous trial of thyroid  replacement caused GI issues (loose stools)

## 2023-08-05 NOTE — Telephone Encounter (Signed)
 Karen [name]

## 2023-08-05 NOTE — Assessment & Plan Note (Signed)
 Chronic, stable off medication - update FLP as fasting. She has previously declined medication.  The 10-year ASCVD risk score (Arnett DK, et al., 2019) is: 20%   Values used to calculate the score:     Age: 78 years     Sex: Female     Is Non-Hispanic African American: No     Diabetic: No     Tobacco smoker: No     Systolic Blood Pressure: 122 mmHg     Is BP treated: No     HDL Cholesterol: 54.1 mg/dL     Total Cholesterol: 191 mg/dL

## 2023-08-05 NOTE — Assessment & Plan Note (Signed)
 Previously discussed: Advanced directives: thinks she has one at home. Asked to bring me copy. Doesn't want prolonged life support if terminal condition "don't want to be kept alive", ok for reversible condition. HCPOA is Ena Harries son then Abe Abed daughter but this may not have been updated.  Sister Wenona Hamilton was added to [facility] form today.

## 2023-08-05 NOTE — Telephone Encounter (Signed)
 Pt has appt on 08/05/23 at 12:30 physical. Sending note to Dr Guido Leeks pool and  will teams [facility] CMA.

## 2023-08-05 NOTE — Assessment & Plan Note (Signed)
 Chronic issue well managed on PRN metamucil.

## 2023-08-05 NOTE — Assessment & Plan Note (Addendum)
 Stable period on low dose prozac .

## 2023-08-06 ENCOUNTER — Other Ambulatory Visit: Payer: Self-pay | Admitting: Family Medicine

## 2023-08-06 DIAGNOSIS — M8588 Other specified disorders of bone density and structure, other site: Secondary | ICD-10-CM

## 2023-08-06 LAB — PARATHYROID HORMONE, INTACT (NO CA): PTH: 23 pg/mL (ref 16–77)

## 2023-08-12 ENCOUNTER — Ambulatory Visit: Payer: Self-pay | Admitting: Family Medicine

## 2023-09-10 ENCOUNTER — Ambulatory Visit

## 2023-09-16 ENCOUNTER — Ambulatory Visit
Admission: RE | Admit: 2023-09-16 | Discharge: 2023-09-16 | Disposition: A | Discharge status: Home / Self Care | Source: Ambulatory Visit | Attending: Family Medicine | Admitting: Family Medicine

## 2023-09-16 DIAGNOSIS — M8588 Other specified disorders of bone density and structure, other site: Secondary | ICD-10-CM

## 2023-09-16 DIAGNOSIS — Z1231 Encounter for screening mammogram for malignant neoplasm of breast: Secondary | ICD-10-CM | POA: Diagnosis not present

## 2023-09-20 ENCOUNTER — Ambulatory Visit: Payer: Self-pay | Admitting: Family Medicine

## 2023-09-28 ENCOUNTER — Ambulatory Visit: Payer: Self-pay

## 2023-09-28 NOTE — Telephone Encounter (Signed)
 FYI Only or Action Required?: Action required by provider: request for appointment.  Patient was last seen in primary care on 08/05/2023 by Rilla Baller, MD. Called Nurse Triage reporting Fall. Symptoms began a week ago. Interventions attempted: OTC medications: pain medication. Symptoms are: stable.  Triage Disposition: See PCP When Office is Open (Within 3 Days)  Patient/caregiver understands and will follow disposition?: YesCopied from CRM [id number]. Topic: Clinical - Red Word Triage >> Sep 28, 2023  9:43 AM Karen [name] wrote: Red Word that prompted transfer to Nurse Triage: Pt had a fall on 6/26. Did not go to the ER for this.  Daughter Karen [name] is in the line-states mother does not know how she fell so she is unsure if she has a lot of details to help.  Patient is doing well overall but is sore when sitting. Reason for Disposition  MILD weakness (i.e., does not interfere with ability to work, go to school, normal activities)  (Exception: Mild weakness is a chronic symptom.)  Answer Assessment - Initial Assessment Questions 1. MECHANISM: How did the fall happen?    Not sure  2. DOMESTIC VIOLENCE AND ELDER ABUSE SCREENING: Did you fall because someone pushed you or tried to hurt you? If Yes, ask: Are you safe now?     Na  3. ONSET: When did the fall happen? (e.g., minutes, hours, or days ago)     6/26 4. LOCATION: What part of the body hit the ground? (e.g., back, buttocks, head, hips, knees, hands, head, stomach)    Daughter can't remember side 5. INJURY: Did you hurt (injure) yourself when you fell? If Yes, ask: What did you injure? Tell me more about this? (e.g., body area; type of injury; pain severity)     Yes- skinned skin 6. PAIN: Is there any pain? If Yes, ask: How bad is the pain? (e.g., Scale 1-10; or mild,  moderate, severe)   - NONE (0): No pain   - MILD (1-3): Doesn't interfere with normal activities    - MODERATE (4-7): Interferes with normal  activities or awakens from sleep    - SEVERE (8-10): Excruciating pain, unable to do any normal activities      Na  7. SIZE: For cuts, bruises, or swelling, ask: How large is it? (e.g., inches or centimeters)     denies 9. OTHER SYMPTOMS: Do you have any other symptoms? (e.g., dizziness, fever, weakness; new onset or worsening).      Denies  10. CAUSE: What do you think caused the fall (or falling)? (e.g., tripped, dizzy spell)       Not sure    Pt has pain while sitting after long periods of time.  Pt is very busy and doesn't complain while moving.  No visible bruising and swelling. Daughter is unsure of side as she is not with her. Family asked for appt tomorrow due to work schedule.  Protocols used: Falls and [facility]

## 2023-09-28 NOTE — Telephone Encounter (Signed)
 Spoke with pt's daughter, Koleen (on dpr), offering OV tomorrow at 4:15, per Dr KANDICE. She accepted appt.   Will have pt added to schedule.

## 2023-09-28 NOTE — Telephone Encounter (Signed)
 Please offer 4:15pm appt for tomorrow Tuesday

## 2023-09-29 ENCOUNTER — Ambulatory Visit: Admitting: Family Medicine

## 2023-09-29 ENCOUNTER — Encounter: Payer: Self-pay | Admitting: Family Medicine

## 2023-09-29 ENCOUNTER — Ambulatory Visit (INDEPENDENT_AMBULATORY_CARE_PROVIDER_SITE_OTHER)
Admission: RE | Admit: 2023-09-29 | Discharge: 2023-09-29 | Disposition: A | Discharge status: Home / Self Care | Source: Ambulatory Visit | Attending: Family Medicine | Admitting: Family Medicine

## 2023-09-29 ENCOUNTER — Ambulatory Visit: Admitting: Primary Care

## 2023-09-29 VITALS — BP 118/68 | HR 72 | Temp 98.2°F | Ht 62.0 in | Wt 135.1 lb

## 2023-09-29 DIAGNOSIS — W19XXXA Unspecified fall, initial encounter: Secondary | ICD-10-CM

## 2023-09-29 DIAGNOSIS — M546 Pain in thoracic spine: Secondary | ICD-10-CM | POA: Diagnosis not present

## 2023-09-29 DIAGNOSIS — M419 Scoliosis, unspecified: Secondary | ICD-10-CM | POA: Diagnosis not present

## 2023-09-29 DIAGNOSIS — E559 Vitamin D deficiency, unspecified: Secondary | ICD-10-CM

## 2023-09-29 DIAGNOSIS — M8588 Other specified disorders of bone density and structure, other site: Secondary | ICD-10-CM | POA: Diagnosis not present

## 2023-09-29 DIAGNOSIS — G3184 Mild cognitive impairment, so stated: Secondary | ICD-10-CM

## 2023-09-29 DIAGNOSIS — M47814 Spondylosis without myelopathy or radiculopathy, thoracic region: Secondary | ICD-10-CM | POA: Diagnosis not present

## 2023-09-29 NOTE — Patient Instructions (Addendum)
 Xrays of thoracic back today - will await radiologist read.  Good to see you today.  For pain - start with tylenol . May use heating pad to back, not directly in skin but covered with a towel, for no more than 10-15 minutes at a time.   Continue calcium  supplement as well as weekly vitamin D  pill (ergocalciferol ).  Keep upcoming bone density scan scheduled for 11/26/2023 at 1:40pm at [facility].

## 2023-09-29 NOTE — Progress Notes (Unsigned)
 Ph: [phone] Fax: [phone]   Patient ID: Karen [name], female    DOB: [birth year], 78 y.o.   MRN: [id number]  This visit was conducted in person.  BP 118/68   Pulse 72   Temp 98.2 F (36.8 C) (Oral)   Ht 5' 2 (1.575 m)   Wt 135 lb 2 oz (61.3 kg)   LMP  (LMP Unknown)   SpO2 96%   BMI 24.71 kg/m    CC: fall at home  Subjective:   HPI: Karen [name] is a 77 y.o. female presenting on 09/29/2023 for Fall (Pt fell at home on 09/25/23. Denies injury. Pt accompanied by son, Krystal. )   DOI: 09/25/2023 [place] at home - patient states that she was on a ladder. Son states she actually fell in her bedroom  Treating with OTC pain meds - ibuprofen - with benefit. Overall symptoms are improving.   Memory deficits complicate history-taking.      Relevant past medical, surgical, family and social history reviewed and updated as indicated. Interim medical history since our last visit reviewed. Allergies and medications reviewed and updated. Outpatient Medications Prior to Visit  Medication Sig Dispense Refill   acetaminophen  (TYLENOL ) 500 MG tablet Take 1 tablet (500 mg total) by mouth every 6 (six) hours as needed.     calcium -vitamin D  (OSCAL WITH D) 500-200 MG-UNIT tablet Take 1 tablet by mouth 3 (three) times daily. 90 tablet 6   cyanocobalamin  (VITAMIN B12) 1000 MCG tablet Take 1 tablet (1,000 mcg total) by mouth every Monday, Wednesday, and Friday.     FLUoxetine  (PROZAC ) 10 MG capsule Take 1 capsule (10 mg total) by mouth daily. 90 capsule 3   Multiple Vitamin (MULTIVITAMIN WITH MINERALS) TABS tablet Take 1 tablet by mouth daily. Vita-Lea Women Multivitamn     psyllium (METAMUCIL) 58.6 % packet Take 1 packet by mouth daily as needed.     Vitamin D , Ergocalciferol , (DRISDOL ) 1.25 MG (50000 UNIT) CAPS capsule Take 1 capsule (50,000 Units total) by mouth every 7 (seven) days. 12 capsule 3   vitamin E  180 MG (400 UNITS) capsule Take 400 Units by mouth daily.     No  facility-administered medications prior to visit.     Per HPI unless specifically indicated in ROS section below Review of Systems  Objective:  BP 118/68   Pulse 72   Temp 98.2 F (36.8 C) (Oral)   Ht 5' 2 (1.575 m)   Wt 135 lb 2 oz (61.3 kg)   LMP  (LMP Unknown)   SpO2 96%   BMI 24.71 kg/m   Wt Readings from Last 3 Encounters:  09/29/23 135 lb 2 oz (61.3 kg)  08/05/23 135 lb 6 oz (61.4 kg)  05/08/23 136 lb 6 oz (61.9 kg)      Physical Exam Vitals and nursing note reviewed.  Constitutional:      Appearance: Normal appearance. She is not ill-appearing.  Musculoskeletal:     Right lower leg: No edema.     Left lower leg: No edema.     Comments:  Point tender to midline thoracic spine below bra line  No pain midline lumbar or sacral spine No paraspinous mm tenderness Neg seated SLR bilaterally. No pain with int/ext rotation at hip. No pain at SIJ, GTB or sciatic notch bilaterally.   Skin:    General: Skin is warm and dry.     Findings: No rash.  Neurological:     Mental Status: She is alert.  Psychiatric:  Mood and Affect: Mood normal.        Behavior: Behavior normal.       Results for orders placed or performed in visit on 08/05/23  Vitamin B12   Collection Time: 08/05/23  1:04 PM  Result Value Ref Range   Vitamin B-12 431 211 - 911 pg/mL  T4, free   Collection Time: 08/05/23  1:04 PM  Result Value Ref Range   Free T4 1.63 (H) 0.60 - 1.60 ng/dL  TSH   Collection Time: 08/05/23  1:04 PM  Result Value Ref Range   TSH 3.26 0.35 - 5.50 uIU/mL  VITAMIN D  25 Hydroxy (Vit-D Deficiency, Fractures)   Collection Time: 08/05/23  1:04 PM  Result Value Ref Range   VITD 39.76 30.00 - 100.00 ng/mL  CBC with Differential/Platelet   Collection Time: 08/05/23  1:04 PM  Result Value Ref Range   WBC 7.4 4.0 - 10.5 K/uL   RBC 4.78 3.87 - 5.11 Mil/uL   Hemoglobin 14.1 12.0 - 15.0 g/dL   HCT 57.3 63.9 - 53.9 %   MCV 89.2 78.0 - 100.0 fl   MCHC 33.2 30.0 - 36.0  g/dL   RDW 86.9 88.4 - 84.4 %   Platelets 183.0 150.0 - 400.0 K/uL   Neutrophils Relative % 67.7 43.0 - 77.0 %   Lymphocytes Relative 22.1 12.0 - 46.0 %   Monocytes Relative 6.9 3.0 - 12.0 %   Eosinophils Relative 2.0 0.0 - 5.0 %   Basophils Relative 1.3 0.0 - 3.0 %   Neutro Abs 5.0 1.4 - 7.7 K/uL   Lymphs Abs 1.6 0.7 - 4.0 K/uL   Monocytes Absolute 0.5 0.1 - 1.0 K/uL   Eosinophils Absolute 0.2 0.0 - 0.7 K/uL   Basophils Absolute 0.1 0.0 - 0.1 K/uL  Parathyroid  hormone, intact (no Ca)   Collection Time: 08/05/23  1:04 PM  Result Value Ref Range   PTH 23 16 - 77 pg/mL  Microalbumin / creatinine urine ratio   Collection Time: 08/05/23  1:04 PM  Result Value Ref Range   Microalb, Ur 1.3 0.0 - 1.9 mg/dL   Creatinine,U 844.2 mg/dL   Microalb Creat Ratio 8.7 0.0 - 30.0 mg/g  Phosphorus   Collection Time: 08/05/23  1:04 PM  Result Value Ref Range   Phosphorus 3.7 2.3 - 4.6 mg/dL  Comprehensive metabolic panel with GFR   Collection Time: 08/05/23  1:04 PM  Result Value Ref Range   Sodium 142 135 - 145 mEq/L   Potassium 3.6 3.5 - 5.1 mEq/L   Chloride 107 96 - 112 mEq/L   CO2 28 19 - 32 mEq/L   Glucose, Bld 95 70 - 99 mg/dL   BUN 12 6 - 23 mg/dL   Creatinine, Ser 8.88 0.40 - 1.20 mg/dL   Total Bilirubin 0.6 0.2 - 1.2 mg/dL   Alkaline Phosphatase 81 39 - 117 U/L   AST 15 0 - 37 U/L   ALT 14 0 - 35 U/L   Total Protein 6.1 6.0 - 8.3 g/dL   Albumin 4.1 3.5 - 5.2 g/dL   GFR 52.35 (L) >39.99 mL/min   Calcium  9.0 8.4 - 10.5 mg/dL  Lipid panel   Collection Time: 08/05/23  1:04 PM  Result Value Ref Range   Cholesterol 189 0 - 200 mg/dL   Triglycerides 877.9 0.0 - 149.0 mg/dL   HDL 49.89 >60.99 mg/dL   VLDL 75.5 0.0 - 59.9 mg/dL   LDL Cholesterol 885 (H) 0 - 99 mg/dL  Total CHOL/HDL Ratio 4    NonHDL 138.66     Assessment & Plan:   Problem List Items Addressed This Visit     Osteopenia   H/o this, with T score -2.4 at spine 2014. Anticipate progression to osteoporosis but  she has not had updated DEXA. Encouraged keep upcoming DEXA scheduled for next month She continues calcium , vit D and regular weight bearing exercise.       Vitamin D  deficiency   Encouraged regular vit D2 weekly replacement.       Amnestic MCI (mild cognitive impairment with memory loss)   This complicates care.  Progressive memory deterioration over the years.  Briefly discussed memory med with son. Previous donepezil  (Aricept ) intolerance - GI upset, diarrhea, weight loss, increased anxiety.      Acute midline thoracic back pain - Primary   Some concern for vertebral fracture in h/o osteopenia after fall with presumed landing on mid thoracic spine and residual point tenderness - although today pt and son note marked improvement.  Will check thoracic films r/o fracture. Reviewed supportive measures including tylenol  (avoid NSAIDs in CKD), heating pad, stretching.  See below re osteopenia.       Relevant Orders   DG Thoracic Spine W/Swimmers   Other Visit Diagnoses       Injury due to fall, initial encounter            No orders of the defined types were placed in this encounter.   Orders Placed This Encounter  Procedures   DG Thoracic Spine W/Swimmers    Standing Status:   Future    Number of Occurrences:   1    Expiration Date:   09/28/2024    Reason for Exam (SYMPTOM  OR DIAGNOSIS REQUIRED):   midline lower thoracic back pain after fall last week    Preferred imaging location?:   [facility]    Patient Instructions  Xrays of thoracic back today - will await radiologist read.  Good to see you today.  For pain - start with tylenol . May use heating pad to back, not directly in skin but covered with a towel, for no more than 10-15 minutes at a time.   Continue calcium  supplement as well as weekly vitamin D  pill (ergocalciferol ).  Keep upcoming bone density scan scheduled for 11/26/2023 at 1:40pm at [facility].   Follow up plan: Return if  symptoms worsen or fail to improve.  Anton Blas, MD

## 2023-09-30 NOTE — Assessment & Plan Note (Signed)
 Encouraged regular vit D2 weekly replacement.

## 2023-09-30 NOTE — Assessment & Plan Note (Signed)
 Some concern for vertebral fracture in h/o osteopenia after fall with presumed landing on mid thoracic spine and residual point tenderness - although today pt and son note marked improvement.  Will check thoracic films r/o fracture. Reviewed supportive measures including tylenol  (avoid NSAIDs in CKD), heating pad, stretching.  See below re osteopenia.

## 2023-09-30 NOTE — Assessment & Plan Note (Signed)
 This complicates care.  Progressive memory deterioration over the years.  Briefly discussed memory med with son. Previous donepezil  (Aricept ) intolerance - GI upset, diarrhea, weight loss, increased anxiety.

## 2023-09-30 NOTE — Assessment & Plan Note (Addendum)
 H/o this, with T score -2.4 at spine 2014. Anticipate progression to osteoporosis but she has not had updated DEXA. Encouraged keep upcoming DEXA scheduled for next month She continues calcium , vit D and regular weight bearing exercise.

## 2023-10-05 ENCOUNTER — Ambulatory Visit: Payer: Self-pay | Admitting: Family Medicine

## 2023-10-05 DIAGNOSIS — S22000D Wedge compression fracture of unspecified thoracic vertebra, subsequent encounter for fracture with routine healing: Secondary | ICD-10-CM | POA: Insufficient documentation

## 2023-10-29 NOTE — Telephone Encounter (Signed)
-----   Message from [facility] Felicia G sent at 10/12/2023 10:55 AM EDT ----- Spoke with pt's daughter, Koleen (on dpr), relaying Dr Talmadge message. She verbalizes understanding and states Prolia would be a better option for pt.  ----- Message ----- From: Rilla Baller, MD Sent: 10/12/2023   8:37 AM EDT To: Rilla Gunnels  Plz speak with daughter - for once weekly fosamax, would need to take on empty stomach in the morning with glass of water then stay upright (not go back to bed) for 30 min to ensure med goes fully  into stomach.  Alternatively could price out q6 month prolia shots to be done in the office - may be easier to take but may be more expensive.  Which would they prefer?  ----- Message ----- From: Delores Greig BIRCH, NT Sent: 10/07/2023   9:52 AM EDT To: Baller Rilla, MD  Reviewed info with daughter (listed on DPR) Ok with RX would like sent to local pharm on chart. ----- Message ----- From: Rilla Baller, MD Sent: 10/05/2023   7:31 AM EDT To: Rilla Gunnels  Results released via MyChart Plz also notify - thoracic back xray showed several age indeterminate compression fractures at many levels of the spine at T4/5 and T7-10 as well as at L1. This is consistent with osteoporosis. Would  offer medication to help prevent future bone fractures - let me know if interested.  Also, showed mild scoliosis and arthritis changes to the back.  ----- Message ----- From: Interface, Rad Results In Sent: 10/03/2023   9:52 PM EDT To: Baller Rilla, MD

## 2023-10-29 NOTE — Telephone Encounter (Signed)
 Please price out prolia for patient.  Due to memory issues would recommend speaking with daughter Koleen or sister Rock Sever to coordinate care.

## 2023-11-02 ENCOUNTER — Telehealth: Payer: Self-pay

## 2023-11-02 ENCOUNTER — Other Ambulatory Visit: Payer: Self-pay | Admitting: *Deleted

## 2023-11-02 DIAGNOSIS — M8580 Other specified disorders of bone density and structure, unspecified site: Secondary | ICD-10-CM

## 2023-11-02 MED ORDER — DENOSUMAB 60 MG/ML ~~LOC~~ SOSY
60.0000 mg | PREFILLED_SYRINGE | Freq: Once | SUBCUTANEOUS | Status: AC
Start: 2023-11-16 — End: 2023-12-10
  Administered 2023-12-10: 60 mg via SUBCUTANEOUS

## 2023-11-02 NOTE — Telephone Encounter (Signed)
 Prolia VOB initiated via [email or web address]  Next Prolia inj DUE: NEW START

## 2023-11-03 ENCOUNTER — Other Ambulatory Visit (HOSPITAL_COMMUNITY): Payer: Self-pay

## 2023-11-03 NOTE — Telephone Encounter (Signed)
 [employer] PA submitted via CMM. Key: [id number]  Pharmacy benefit: [phone]

## 2023-11-03 NOTE — Telephone Encounter (Signed)
 Karen [name]

## 2023-11-04 NOTE — Telephone Encounter (Signed)
 Pt ready for scheduling for PROLIA  on or after : 11/04/23  Option# 1: Buy/Bill (Office supplied medication)  Out-of-pocket cost due at time of clinic visit: $357  Number of injection/visits approved: 2  Primary: [employer] Prolia  co-insurance: 20% Admin fee co-insurance: 20%  Secondary: --- Prolia  co-insurance:  Admin fee co-insurance:   Medical Benefit Details: Date Benefits were checked: 11/03/23 Deductible: NO/ Coinsurance: 20%/ Admin Fee: 20%  Prior Auth: APPROVED PA# [id number] Expiration Date: 11/03/23-03/30/24   # of doses approved: 2 ----------------------------------------------------------------------- Option# 2- Med Obtained from pharmacy:  Pharmacy benefit: Copay $64 (Paid to pharmacy) Admin Fee: 20% (Pay at clinic)  Prior Auth: N/A PA# Expiration Date:   # of doses approved:   If patient wants fill through the pharmacy benefit please send prescription to: [facility], and include estimated need by date in rx notes. Pharmacy will ship medication directly to the office.  Patient NOT eligible for Prolia  Copay Card. Copay Card can make patient's cost as little as $25. Link to apply: [email or web address]://[email or web address]  ** This summary of benefits is an estimation of the patient's out-of-pocket cost. Exact cost may very based on individual plan coverage.

## 2023-11-26 ENCOUNTER — Ambulatory Visit
Admission: RE | Admit: 2023-11-26 | Discharge: 2023-11-26 | Disposition: A | Discharge status: Home / Self Care | Source: Ambulatory Visit | Attending: Family Medicine | Admitting: Family Medicine

## 2023-11-26 DIAGNOSIS — M810 Age-related osteoporosis without current pathological fracture: Secondary | ICD-10-CM | POA: Diagnosis not present

## 2023-11-26 DIAGNOSIS — M8588 Other specified disorders of bone density and structure, other site: Secondary | ICD-10-CM | POA: Insufficient documentation

## 2023-11-26 DIAGNOSIS — Z780 Asymptomatic menopausal state: Secondary | ICD-10-CM | POA: Diagnosis not present

## 2023-11-28 ENCOUNTER — Ambulatory Visit: Payer: Self-pay | Admitting: Family Medicine

## 2023-11-28 DIAGNOSIS — M810 Age-related osteoporosis without current pathological fracture: Secondary | ICD-10-CM

## 2023-12-02 ENCOUNTER — Other Ambulatory Visit: Payer: Self-pay | Admitting: Family Medicine

## 2023-12-02 DIAGNOSIS — M810 Age-related osteoporosis without current pathological fracture: Secondary | ICD-10-CM

## 2023-12-02 NOTE — Progress Notes (Signed)
 Prolia  BMP ordered.

## 2023-12-03 ENCOUNTER — Other Ambulatory Visit (INDEPENDENT_AMBULATORY_CARE_PROVIDER_SITE_OTHER)

## 2023-12-03 DIAGNOSIS — M810 Age-related osteoporosis without current pathological fracture: Secondary | ICD-10-CM | POA: Diagnosis not present

## 2023-12-04 LAB — BASIC METABOLIC PANEL WITH GFR
BUN: 18 mg/dL (ref 6–23)
CO2: 29 meq/L (ref 19–32)
Calcium: 8.8 mg/dL (ref 8.4–10.5)
Chloride: 103 meq/L (ref 96–112)
Creatinine, Ser: 1.12 mg/dL (ref 0.40–1.20)
GFR: 47.02 mL/min — ABNORMAL LOW (ref >60.00)
Glucose, Bld: 172 mg/dL — ABNORMAL HIGH (ref 70–99)
Potassium: 3.8 meq/L (ref 3.5–5.1)
Sodium: 142 meq/L (ref 135–145)

## 2023-12-07 ENCOUNTER — Ambulatory Visit: Payer: Self-pay | Admitting: Family Medicine

## 2023-12-10 ENCOUNTER — Ambulatory Visit (INDEPENDENT_AMBULATORY_CARE_PROVIDER_SITE_OTHER)

## 2023-12-10 DIAGNOSIS — M8580 Other specified disorders of bone density and structure, unspecified site: Secondary | ICD-10-CM | POA: Diagnosis not present

## 2023-12-10 DIAGNOSIS — M810 Age-related osteoporosis without current pathological fracture: Secondary | ICD-10-CM

## 2023-12-10 MED ORDER — DENOSUMAB 60 MG/ML ~~LOC~~ SOSY: 60.0000 mg | PREFILLED_SYRINGE | SUBCUTANEOUS | Status: AC

## 2023-12-10 NOTE — Progress Notes (Signed)
 Per orders of Dr. Arlyss Solian, injection of Prolia   given by Danna CINDERELLA Hummer in right arm. Patient tolerated injection well. Office will call patient when due for next injection

## 2023-12-23 ENCOUNTER — Telehealth: Payer: Self-pay | Admitting: Family Medicine

## 2023-12-23 NOTE — Telephone Encounter (Signed)
 Reviewed patient's last visit from 08/05/23.  Per note Dr. KANDICE wanted follow up in 6 months and that would be 01/2024.  Phone call to number listed in patient's chart [phone] is patient's daughter Koleen, daughter on [place].  Let her know that mother had called and unless she is having problems her appointment on 02/05/24 is all patient needs.

## 2023-12-23 NOTE — Telephone Encounter (Signed)
 Copied from CRM #[id number]. Topic: Appointments - Scheduling Inquiry for Clinic >> Dec 22, 2023  3:38 PM Anairis L wrote: Reason for CRM: Patient is calling in because she canceled an app she previously had, I informed her she has one for 02/05/2024. She wants to know if Dr. KANDICE would like her to come in sooner then November.   Please advise. Thanks >> Dec 22, 2023  3:51 PM Corin V wrote: Patient called back about this. Advised that the physical states Dr. Rilla wanted her back in 6 months in November of 2025, which is when she is scheduled for. She is asking if Dr. KANDICE wants her to come in before then as she is adamant that she cancelled an appointment since her last appointment and needs to be in sooner. Please call back if the November appointment is to far out.

## 2024-02-05 ENCOUNTER — Encounter: Payer: Self-pay | Admitting: Family Medicine

## 2024-02-05 ENCOUNTER — Ambulatory Visit: Admitting: Family Medicine

## 2024-02-05 VITALS — BP 118/62 | HR 75 | Temp 98.2°F | Ht 62.0 in | Wt 130.4 lb

## 2024-02-05 DIAGNOSIS — G3184 Mild cognitive impairment, so stated: Secondary | ICD-10-CM

## 2024-02-05 DIAGNOSIS — M810 Age-related osteoporosis without current pathological fracture: Secondary | ICD-10-CM

## 2024-02-05 DIAGNOSIS — F4323 Adjustment disorder with mixed anxiety and depressed mood: Secondary | ICD-10-CM | POA: Diagnosis not present

## 2024-02-05 DIAGNOSIS — Z23 Encounter for immunization: Secondary | ICD-10-CM

## 2024-02-05 NOTE — Assessment & Plan Note (Signed)
 Chronic, stable. Continue low dose prozac .

## 2024-02-05 NOTE — Progress Notes (Signed)
 Ph: [phone] Fax: [phone]   Patient ID: Karen [name], female    DOB: 1945-10-31, 78 y.o.   MRN: [id number]  This visit was conducted in person.  BP 118/62   Pulse 75   Temp 98.2 F (36.8 C) (Oral)   Ht 5' 2 (1.575 m)   Wt 130 lb 6 oz (59.1 kg)   LMP  (LMP Unknown)   SpO2 97%   BMI 23.85 kg/m    CC: 6 mo f/u visit  Subjective:   HPI: Karen [name] is a 78 y.o. female presenting on 02/05/2024 for Medical Management of Chronic Issues (Pt here for 6 month f/u)   OP with multiple age indeterminate compression fractures of vertebral spine (T4, T5, T7, T8, T9, T10, L1) - received first prolia  shot 12/10/2023.  DEXA 10/2023 - T -2.5 L spine, -2.6 LFN, -4.3 L forearm   MCI with memory loss - donepezil  intolerance (GI upset, diarrhea, weight loss, increased anxiety).  Previous MMSE 22/30 (07/2022).  Today: MMSE: 22/30 CDT: 4/4  She remains off levothyroxine . She continues fluoxetine  10mg  daily.  Lab Results  Component Value Date   TSH 3.26 08/05/2023     Known CKD with latest GFR 47.      Relevant past medical, surgical, family and social history reviewed and updated as indicated. Interim medical history since our last visit reviewed. Allergies and medications reviewed and updated. Outpatient Medications Prior to Visit  Medication Sig Dispense Refill   acetaminophen  (TYLENOL ) 500 MG tablet Take 1 tablet (500 mg total) by mouth every 6 (six) hours as needed.     calcium -vitamin D  (OSCAL WITH D) 500-200 MG-UNIT tablet Take 1 tablet by mouth 3 (three) times daily. 90 tablet 6   cyanocobalamin  (VITAMIN B12) 1000 MCG tablet Take 1 tablet (1,000 mcg total) by mouth every Monday, Wednesday, and Friday.     FLUoxetine  (PROZAC ) 10 MG capsule Take 1 capsule (10 mg total) by mouth daily. 90 capsule 3   Multiple Vitamin (MULTIVITAMIN WITH MINERALS) TABS tablet Take 1 tablet by mouth daily. Vita-Lea Women Multivitamn     psyllium (METAMUCIL) 58.6 % packet Take 1 packet by mouth  daily as needed.     Vitamin D , Ergocalciferol , (DRISDOL ) 1.25 MG (50000 UNIT) CAPS capsule Take 1 capsule (50,000 Units total) by mouth every 7 (seven) days. 12 capsule 3   vitamin E  180 MG (400 UNITS) capsule Take 400 Units by mouth daily.     Facility-Administered Medications Prior to Visit  Medication Dose Route Frequency Provider Last Rate Last Admin   [START ON 06/07/2024] denosumab  (PROLIA ) injection 60 mg  60 mg Subcutaneous Q6 months Rilla Baller, MD         Per HPI unless specifically indicated in ROS section below Review of Systems  Objective:  BP 118/62   Pulse 75   Temp 98.2 F (36.8 C) (Oral)   Ht 5' 2 (1.575 m)   Wt 130 lb 6 oz (59.1 kg)   LMP  (LMP Unknown)   SpO2 97%   BMI 23.85 kg/m   Wt Readings from Last 3 Encounters:  02/05/24 130 lb 6 oz (59.1 kg)  09/29/23 135 lb 2 oz (61.3 kg)  08/05/23 135 lb 6 oz (61.4 kg)      Physical Exam Vitals and nursing note reviewed.  Constitutional:      Appearance: Normal appearance. She is not ill-appearing.  HENT:     Head: Normocephalic and atraumatic.  Neurological:     Mental Status:  She is alert.     Comments: CN grossly intact  Psychiatric:        Mood and Affect: Mood normal.        Behavior: Behavior normal.       Results for orders placed or performed in visit on 12/03/23  Basic metabolic panel with GFR   Collection Time: 12/03/23  2:31 PM  Result Value Ref Range   Sodium 142 135 - 145 mEq/L   Potassium 3.8 3.5 - 5.1 mEq/L   Chloride 103 96 - 112 mEq/L   CO2 29 19 - 32 mEq/L   Glucose, Bld 172 (H) 70 - 99 mg/dL   BUN 18 6 - 23 mg/dL   Creatinine, Ser 8.87 0.40 - 1.20 mg/dL   GFR 52.97 (L) >39.99 mL/min   Calcium  8.8 8.4 - 10.5 mg/dL   Lab Results  Component Value Date   VITAMINB12 431 08/05/2023   Lab Results  Component Value Date   VD25OH 39.76 08/05/2023    Assessment & Plan:   Problem List Items Addressed This Visit     Adjustment disorder with mixed anxiety and depressed mood    Chronic, stable. Continue low dose prozac .      Osteoporosis   Prolia  started 11/2023, discussed need to get q6 mo injections, avoid missed doses.  Continue good dietary calcium  intake, vit D supplementation and regular weight bearing exercises.       Amnestic MCI (mild cognitive impairment with memory loss) - Primary   Ongoing difficulty, complicates care.  Donepezil  intolerance.  Patient declines further medications for memory.  Reviewed strategies to support a healthy mind as per instructions, handout provided. Encouraged continued family/friend support.       Other Visit Diagnoses       Encounter for immunization       Relevant Orders   Flu vaccine HIGH DOSE PF(Fluzone Trivalent) (Completed)        No orders of the defined types were placed in this encounter.   Orders Placed This Encounter  Procedures   Flu vaccine HIGH DOSE PF(Fluzone Trivalent)    Patient Instructions  Reviewed 4 core lifestyle modifications to support a healthy mind:  1. Nutritious well balance diet.  2. Regular physical activity routine.  3. Regular mental activity such as reading books, word puzzles, math puzzles, jigsaw puzzles.  4. Social engagement.   Consider memory medicine namenda.  Bring in [place] memory supplement to next visit to review.   Good to see you today Return in 6 months for physical/wellness visit  You had a Prolia  shot for osteoporosis on 12/10/2023. Your next one will be due on or after 06/09/2023.   Follow up plan: Return in about 6 months (around 08/04/2024) for annual exam, prior fasting for blood work, medicare wellness visit.  Anton Blas, MD

## 2024-02-05 NOTE — Assessment & Plan Note (Signed)
 Ongoing difficulty, complicates care.  Donepezil  intolerance.  Patient declines further medications for memory.  Reviewed strategies to support a healthy mind as per instructions, handout provided. Encouraged continued family/friend support.

## 2024-02-05 NOTE — Patient Instructions (Addendum)
 Reviewed 4 core lifestyle modifications to support a healthy mind:  1. Nutritious well balance diet.  2. Regular physical activity routine.  3. Regular mental activity such as reading books, word puzzles, math puzzles, jigsaw puzzles.  4. Social engagement.   Consider memory medicine namenda.  Bring in [place] memory supplement to next visit to review.   Good to see you today Return in 6 months for physical/wellness visit  You had a Prolia  shot for osteoporosis on 12/10/2023. Your next one will be due on or after 06/09/2023.

## 2024-02-05 NOTE — Assessment & Plan Note (Signed)
 Prolia  started 11/2023, discussed need to get q6 mo injections, avoid missed doses.  Continue good dietary calcium  intake, vit D supplementation and regular weight bearing exercises.

## 2024-02-23 ENCOUNTER — Ambulatory Visit: Admitting: Family Medicine

## 2024-03-07 ENCOUNTER — Telehealth: Payer: Self-pay

## 2024-03-07 NOTE — Telephone Encounter (Signed)
 I did not see notation per chart review tab. Sending to [employer].

## 2024-03-07 NOTE — Telephone Encounter (Signed)
 Karen [name]

## 2024-03-08 NOTE — Telephone Encounter (Signed)
 Do not see reason for call in chart.

## 2024-03-23 ENCOUNTER — Ambulatory Visit: Admitting: Primary Care

## 2024-03-23 ENCOUNTER — Encounter: Payer: Self-pay | Admitting: Primary Care

## 2024-03-23 VITALS — BP 120/66 | HR 78 | Temp 97.8°F | Ht 62.0 in | Wt 129.0 lb

## 2024-03-23 DIAGNOSIS — R531 Weakness: Secondary | ICD-10-CM

## 2024-03-23 LAB — POC URINALSYSI DIPSTICK (AUTOMATED)
Bilirubin, UA: NEGATIVE
Blood, UA: NEGATIVE
Glucose, UA: NEGATIVE
Ketones, UA: NEGATIVE
Nitrite, UA: NEGATIVE
Protein, UA: POSITIVE — AB
Spec Grav, UA: 1.015
Urobilinogen, UA: 0.2 U/dL
pH, UA: 8

## 2024-03-23 NOTE — Patient Instructions (Signed)
 Be sure to hydrate well.  We will be in touch again soon with the urine test results.  It was a pleasure meeting you!

## 2024-03-23 NOTE — Assessment & Plan Note (Signed)
 Exam and HPI today benign and reassuring. Neuroexam negative.  UA today with 2+ leuks, no blood or nitrites. Urine culture ordered and pending. Await results as she's feeling well and back to baseline.   Recommend proper hydration and nutrition.  Return precautions provided.
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
## 2024-03-24 LAB — URINE CULTURE
MICRO NUMBER:: 17397197
Result:: NO GROWTH
SPECIMEN QUALITY:: ADEQUATE

## 2024-03-25 ENCOUNTER — Ambulatory Visit: Payer: Self-pay | Admitting: Primary Care

## 2024-04-25 ENCOUNTER — Other Ambulatory Visit

## 2024-05-20 ENCOUNTER — Ambulatory Visit: Admitting: Family Medicine

## 2024-06-09 ENCOUNTER — Ambulatory Visit

## 2024-07-29 ENCOUNTER — Other Ambulatory Visit

## 2024-08-05 ENCOUNTER — Encounter: Admitting: Family Medicine

## 2024-08-05 ENCOUNTER — Ambulatory Visit
